# Patient Record
Sex: Male | Born: 1985
Health system: Southern US, Community
[De-identification: ages and names within clinical notes are randomized; demographics above are authoritative.]

## PROBLEM LIST (undated history)

## (undated) DIAGNOSIS — Z789 Other specified health status: Secondary | ICD-10-CM

## (undated) HISTORY — PX: ROTATOR CUFF REPAIR: SHX139

## (undated) HISTORY — PX: OTHER SURGICAL HISTORY: SHX169

---

## 2004-07-15 ENCOUNTER — Ambulatory Visit (HOSPITAL_COMMUNITY): Admission: RE | Admit: 2004-07-15 | Discharge: 2004-07-15 | Payer: Self-pay | Admitting: Family Medicine

## 2013-01-04 ENCOUNTER — Encounter (HOSPITAL_COMMUNITY)
Admission: RE | Admit: 2013-01-04 | Discharge: 2013-01-04 | Disposition: A | Discharge status: Home / Self Care | Payer: BC Managed Care – PPO | Source: Ambulatory Visit | Attending: General Surgery | Admitting: General Surgery

## 2013-01-04 ENCOUNTER — Encounter (HOSPITAL_COMMUNITY): Payer: Self-pay | Admitting: Pharmacy Technician

## 2013-01-04 ENCOUNTER — Encounter (HOSPITAL_COMMUNITY): Payer: Self-pay

## 2013-01-04 HISTORY — DX: Other specified health status: Z78.9

## 2013-01-04 LAB — CBC WITH DIFFERENTIAL/PLATELET
Basophils Relative: 0 % (ref 0–1)
Eosinophils Absolute: 0.1 10*3/uL (ref 0.0–0.7)
HCT: 42 % (ref 39.0–52.0)
Lymphs Abs: 0.7 10*3/uL (ref 0.7–4.0)
Neutro Abs: 7.2 10*3/uL (ref 1.7–7.7)
Neutrophils Relative %: 82 % — ABNORMAL HIGH (ref 43–77)
Platelets: DECREASED 10*3/uL (ref 150–400)
RBC: 4.87 MIL/uL (ref 4.22–5.81)
RDW: 12.4 % (ref 11.5–15.5)

## 2013-01-04 LAB — BASIC METABOLIC PANEL
BUN: 11 mg/dL (ref 6–23)
Calcium: 9.3 mg/dL (ref 8.4–10.5)
Chloride: 104 mEq/L (ref 96–112)
Creatinine, Ser: 1.08 mg/dL (ref 0.50–1.35)
GFR calc Af Amer: >90 mL/min (ref >90)
Sodium: 139 mEq/L (ref 135–145)

## 2013-01-04 NOTE — H&P (Signed)
  NTS SOAP Note  Vital Signs:  Vitals as of: 01/04/2013: Systolic 163: Diastolic 81: Heart Rate 75: Temp 96.23F: Height 53ft 0in: Weight 273Lbs 0 Ounces: Pain Level 10: BMI 37.03  BMI : 37.03 kg/m2  Subjective: This 27 Years 94 Months old Male presents for of a perianal abscess.  Started swelling two days ago.  Was started on cipro and hydrocodone, but started having tongue swelling, so he stopped both of them.  No drainage noted.  Never has had this before.  Review of Symptoms:  Constitutional:unremarkable   Head:unremarkable    Eyes:unremarkable   Nose/Mouth/Throat:unremarkable Cardiovascular:  unremarkable   Respiratory:unremarkable   Gastrointestinal:  unremarkable   Genitourinary:unremarkable       joint, back, and neck pain Skin:unremarkable Hematolgic/Lymphatic:unremarkable     Allergic/Immunologic:unremarkable     Past Medical History:    Reviewed   Past Medical History  Surgical History: none Medical Problems: none Allergies: ?hydrocodone or ciprofloxacin Medications: none   Social History:Reviewed  Social History  Preferred Language: English Race:  White Ethnicity: Not Hispanic / Latino Age: 27 Years 5 Months Marital Status:  M Alcohol:  No Recreational drug(s):  No   Smoking Status: Never smoker reviewed on 01/04/2013 Functional Status reviewed on mm/dd/yyyy ------------------------------------------------ Bathing: Normal Cooking: Normal Dressing: Normal Driving: Normal Eating: Normal Managing Meds: Normal Oral Care: Normal Shopping: Normal Toileting: Normal Transferring: Normal Walking: Normal Cognitive Status reviewed on mm/dd/yyyy ------------------------------------------------ Attention: Normal Decision Making: Normal Language: Normal Memory: Normal Motor: Normal Perception: Normal Problem Solving: Normal Visual and Spatial: Normal   Family History:  Reviewed  Family Health  History Mother, Living; Healthy; healthy Father, Living; Prostate cancer;     Objective Information: General:  Well appearing, well nourished in no distress. Heart:  RRR, no murmur Lungs:    CTA bilaterally, no wheezes, rhonchi, rales.  Breathing unlabored.   Large fluctulant area along left perianal region.  Rectal exam limited secondary to pain.  Assessment:Perianal abscess  Diagnosis &amp; Procedure Smart Code   Plan:Scheduled for incision and drainage of perianal abscess on 01/05/13.  will try flagyl and ultram.   Patient Education:Alternative treatments to surgery were discussed with patient (and family).  Risks and benefits  of procedure were fully explained to the patient (and family) who gave informed consent. Patient/family questions were addressed.  Follow-up:Pending Surgery

## 2013-01-04 NOTE — Patient Instructions (Addendum)
Your procedure is scheduled on: 01/05/2013  Report to [facility] at   10:00  AM.  Call this number if you have problems the morning of surgery: [phone]   Remember:   Do not drink or eat food:After Midnight.  With a small sip of water you make your Tramadol if needed morning of surgery.  :   Do not wear jewelry, make-up or nail polish.  Do not wear lotions, powders, or perfumes. You may wear deodorant.  Do not shave 48 hours prior to surgery. Men may shave face and neck.  Do not bring valuables to the hospital.  Contacts, dentures or bridgework may not be worn into surgery.  Leave suitcase in the car. After surgery it may be brought to your room.  For patients admitted to the hospital, checkout time is 11:00 AM the day of discharge.   Patients discharged the day of surgery will not be allowed to drive home.    Special Instructions: Shower using CHG 2 nights before surgery and the night before surgery.  If you shower the day of surgery use CHG.  Use special wash - you have one bottle of CHG for all showers.  You should use approximately 1/3 of the bottle for each shower.   Please read over the following fact sheets that you were given: Pain Booklet, MRSA Information, Surgical Site Infection Prevention and Care and Recovery After Surgery  Incision and Drainage Care After Refer to this sheet in the next few weeks. These instructions provide you with information on caring for yourself after your procedure. Your caregiver may also give you more specific instructions. Your treatment has been planned according to current medical practices, but problems sometimes occur. Call your caregiver if you have any problems or questions after your procedure. HOME CARE INSTRUCTIONS   If antibiotic medicine is given, take it as directed. Finish it even if you start to feel better.  Only take over-the-counter or prescription medicines for pain, discomfort, or fever as directed by your caregiver.  Keep  all follow-up appointments as directed by your caregiver.  Change any bandages (dressings) as directed by your caregiver. Replace old dressings with clean dressings.  Wash your hands before and after caring for your wound. You will receive specific instructions for cleansing and caring for your wound.  SEEK MEDICAL CARE IF:   You have increased pain, swelling, or redness around the wound.  You have increased drainage, smell, or bleeding from the wound.  You have muscle aches, chills, or you feel generally sick.  You have a fever. MAKE SURE YOU:   Understand these instructions.  Will watch your condition.  Will get help right away if you are not doing well or get worse. Document Released: 04/26/2011 Document Reviewed: 04/26/2011 [facility] Patient Information 2014 [place], [place].   PATIENT INSTRUCTIONS POST-ANESTHESIA  IMMEDIATELY FOLLOWING SURGERY:  Do not drive or operate machinery for the first twenty four hours after surgery.  Do not make any important decisions for twenty four hours after surgery or while taking narcotic pain medications or sedatives.  If you develop intractable nausea and vomiting or a severe headache please notify your doctor immediately.  FOLLOW-UP:  Please make an appointment with your surgeon as instructed. You do not need to follow up with anesthesia unless specifically instructed to do so.  WOUND CARE INSTRUCTIONS (if applicable):  Keep a dry clean dressing on the anesthesia/puncture wound site if there is drainage.  Once the wound has quit draining you may  leave it open to air.  Generally you should leave the bandage intact for twenty four hours unless there is drainage.  If the epidural site drains for more than 36-48 hours please call the anesthesia department.  QUESTIONS?:  Please feel free to call your physician or the hospital operator if you have any questions, and they will be happy to assist you.

## 2013-01-05 ENCOUNTER — Encounter (HOSPITAL_COMMUNITY): Payer: BC Managed Care – PPO | Admitting: Anesthesiology

## 2013-01-05 ENCOUNTER — Encounter (HOSPITAL_COMMUNITY)
Admission: RE | Disposition: A | Discharge status: Home / Self Care | Payer: Self-pay | Source: Ambulatory Visit | Attending: General Surgery

## 2013-01-05 ENCOUNTER — Encounter (HOSPITAL_COMMUNITY): Payer: Self-pay | Admitting: *Deleted

## 2013-01-05 ENCOUNTER — Ambulatory Visit (HOSPITAL_COMMUNITY): Payer: BC Managed Care – PPO | Admitting: Anesthesiology

## 2013-01-05 ENCOUNTER — Ambulatory Visit (HOSPITAL_COMMUNITY)
Admission: RE | Admit: 2013-01-05 | Discharge: 2013-01-05 | Disposition: A | Discharge status: Home / Self Care | Payer: BC Managed Care – PPO | Source: Ambulatory Visit | Attending: General Surgery | Admitting: General Surgery

## 2013-01-05 DIAGNOSIS — K612 Anorectal abscess: Secondary | ICD-10-CM | POA: Insufficient documentation

## 2013-01-05 DIAGNOSIS — K645 Perianal venous thrombosis: Secondary | ICD-10-CM | POA: Insufficient documentation

## 2013-01-05 HISTORY — PX: INCISION AND DRAINAGE ABSCESS: SHX5864

## 2013-01-05 SURGERY — INCISION AND DRAINAGE, ABSCESS
Anesthesia: General | Site: Rectum | Wound class: Dirty or Infected

## 2013-01-05 MED ORDER — BUPIVACAINE HCL (PF) 0.5 % IJ SOLN
INTRAMUSCULAR | Status: AC
  Filled 2013-01-05: qty 30

## 2013-01-05 MED ORDER — LIDOCAINE HCL (PF) 1 % IJ SOLN
INTRAMUSCULAR | Status: AC
  Filled 2013-01-05: qty 5

## 2013-01-05 MED ORDER — GLYCOPYRROLATE 0.2 MG/ML IJ SOLN
INTRAMUSCULAR | Status: AC
  Filled 2013-01-05: qty 2

## 2013-01-05 MED ORDER — SODIUM CHLORIDE 0.9 % IR SOLN
Status: DC | PRN
  Administered 2013-01-05: 1000 mL

## 2013-01-05 MED ORDER — DEXTROSE 5 % IV SOLN
2.0000 g | INTRAVENOUS | Status: AC
  Administered 2013-01-05: 2 g via INTRAVENOUS

## 2013-01-05 MED ORDER — KETOROLAC TROMETHAMINE 30 MG/ML IJ SOLN
30.0000 mg | Freq: Once | INTRAMUSCULAR | Status: AC
  Administered 2013-01-05: 30 mg via INTRAVENOUS
  Filled 2013-01-05: qty 1

## 2013-01-05 MED ORDER — MIDAZOLAM HCL 2 MG/2ML IJ SOLN
INTRAMUSCULAR | Status: AC
  Filled 2013-01-05: qty 2

## 2013-01-05 MED ORDER — CHLORHEXIDINE GLUCONATE 4 % EX LIQD: 1.0000 "application " | Freq: Once | CUTANEOUS | Status: DC

## 2013-01-05 MED ORDER — NEOSTIGMINE METHYLSULFATE 1 MG/ML IJ SOLN
INTRAMUSCULAR | Status: AC
  Filled 2013-01-05: qty 1

## 2013-01-05 MED ORDER — FENTANYL CITRATE 0.05 MG/ML IJ SOLN
25.0000 ug | INTRAMUSCULAR | Status: DC | PRN
  Administered 2013-01-05: 50 ug via INTRAVENOUS

## 2013-01-05 MED ORDER — FENTANYL CITRATE 0.05 MG/ML IJ SOLN
INTRAMUSCULAR | Status: AC
  Filled 2013-01-05: qty 2

## 2013-01-05 MED ORDER — DEXTROSE 5 % IV SOLN
INTRAVENOUS | Status: AC
  Filled 2013-01-05: qty 2

## 2013-01-05 MED ORDER — ONDANSETRON HCL 4 MG/2ML IJ SOLN
INTRAMUSCULAR | Status: AC
  Filled 2013-01-05: qty 2

## 2013-01-05 MED ORDER — BUPIVACAINE HCL (PF) 0.5 % IJ SOLN
INTRAMUSCULAR | Status: DC | PRN
  Administered 2013-01-05: 10 mL

## 2013-01-05 MED ORDER — ONDANSETRON HCL 4 MG/2ML IJ SOLN: 4.0000 mg | Freq: Once | INTRAMUSCULAR | Status: DC | PRN

## 2013-01-05 MED ORDER — PROPOFOL 10 MG/ML IV EMUL
INTRAVENOUS | Status: AC
  Filled 2013-01-05: qty 20

## 2013-01-05 MED ORDER — FENTANYL CITRATE 0.05 MG/ML IJ SOLN
INTRAMUSCULAR | Status: DC | PRN
  Administered 2013-01-05 (×2): 50 ug via INTRAVENOUS

## 2013-01-05 MED ORDER — MIDAZOLAM HCL 5 MG/5ML IJ SOLN
INTRAMUSCULAR | Status: DC | PRN
  Administered 2013-01-05: 2 mg via INTRAVENOUS

## 2013-01-05 MED ORDER — FENTANYL CITRATE 0.05 MG/ML IJ SOLN
25.0000 ug | INTRAMUSCULAR | Status: AC
  Administered 2013-01-05 (×2): 25 ug via INTRAVENOUS

## 2013-01-05 MED ORDER — LACTATED RINGERS IV SOLN
INTRAVENOUS | Status: DC
  Administered 2013-01-05: 11:00:00 via INTRAVENOUS

## 2013-01-05 MED ORDER — PROPOFOL 10 MG/ML IV BOLUS
INTRAVENOUS | Status: DC | PRN
  Administered 2013-01-05: 180 mg via INTRAVENOUS

## 2013-01-05 MED ORDER — MIDAZOLAM HCL 2 MG/2ML IJ SOLN
1.0000 mg | INTRAMUSCULAR | Status: DC | PRN
  Administered 2013-01-05 (×2): 2 mg via INTRAVENOUS

## 2013-01-05 MED ORDER — ONDANSETRON HCL 4 MG/2ML IJ SOLN
4.0000 mg | Freq: Once | INTRAMUSCULAR | Status: AC
  Administered 2013-01-05: 4 mg via INTRAVENOUS

## 2013-01-05 MED ORDER — LIDOCAINE HCL 1 % IJ SOLN
INTRAMUSCULAR | Status: DC | PRN
  Administered 2013-01-05: 50 mg via INTRADERMAL

## 2013-01-05 SURGICAL SUPPLY — 30 items
BAG HAMPER (MISCELLANEOUS) ×2 IMPLANT
CLOTH BEACON ORANGE TIMEOUT ST (SAFETY) ×2 IMPLANT
COVER LIGHT HANDLE STERIS (MISCELLANEOUS) ×4 IMPLANT
DECANTER SPIKE VIAL GLASS SM (MISCELLANEOUS) ×2 IMPLANT
ELECT REM PT RETURN 9FT ADLT (ELECTROSURGICAL) ×2
ELECTRODE REM PT RTRN 9FT ADLT (ELECTROSURGICAL) ×1 IMPLANT
GAUZE PACKING IODOFORM 1 (PACKING) ×1 IMPLANT
GLOVE BIOGEL M STRL SZ7.5 (GLOVE) ×2 IMPLANT
GLOVE BIOGEL PI IND STRL 7.0 (GLOVE) IMPLANT
GLOVE BIOGEL PI IND STRL 7.5 (GLOVE) IMPLANT
GLOVE BIOGEL PI INDICATOR 7.0 (GLOVE) ×2
GLOVE BIOGEL PI INDICATOR 7.5 (GLOVE) ×1
GLOVE ECLIPSE 7.0 STRL STRAW (GLOVE) ×2 IMPLANT
GLOVE EXAM NITRILE LRG STRL (GLOVE) ×1 IMPLANT
GLOVE SS BIOGEL STRL SZ 6.5 (GLOVE) IMPLANT
GLOVE SUPERSENSE BIOGEL SZ 6.5 (GLOVE) ×1
GOWN STRL REIN XL XLG (GOWN DISPOSABLE) ×5 IMPLANT
KIT ROOM TURNOVER APOR (KITS) ×2 IMPLANT
MANIFOLD NEPTUNE II (INSTRUMENTS) ×2 IMPLANT
MARKER SKIN DUAL TIP RULER LAB (MISCELLANEOUS) ×2 IMPLANT
NDL HYPO 21X1.5 SAFETY (NEEDLE) IMPLANT
NEEDLE HYPO 21X1.5 SAFETY (NEEDLE) ×2 IMPLANT
NS IRRIG 1000ML POUR BTL (IV SOLUTION) ×2 IMPLANT
PACK MINOR (CUSTOM PROCEDURE TRAY) ×2 IMPLANT
PAD ARMBOARD 7.5X6 YLW CONV (MISCELLANEOUS) ×2 IMPLANT
SET BASIN LINEN APH (SET/KITS/TRAYS/PACK) ×2 IMPLANT
SWAB CULTURE LIQ STUART DBL (MISCELLANEOUS) ×1 IMPLANT
SYR BULB IRRIGATION 50ML (SYRINGE) ×2 IMPLANT
SYR CONTROL 10ML LL (SYRINGE) ×1 IMPLANT
TUBE ANAEROBIC PORT A CUL  W/M (MISCELLANEOUS) ×1 IMPLANT

## 2013-01-05 NOTE — Anesthesia Procedure Notes (Signed)
Procedure Name: Intubation Date/Time: 01/05/2013 12:23 PM Performed by: Despina Hidden Pre-anesthesia Checklist: Emergency Drugs available, Patient identified, Suction available and Patient being monitored Patient Re-evaluated:Patient Re-evaluated prior to inductionOxygen Delivery Method: Circle system utilized Preoxygenation: Pre-oxygenation with 100% oxygen Intubation Type: IV induction Ventilation: Mask ventilation without difficulty LMA: LMA inserted LMA Size: 5.0 Tube type: Oral Number of attempts: 1 Placement Confirmation: positive ETCO2 and breath sounds checked- equal and bilateral Tube secured with: Tape Dental Injury: Teeth and Oropharynx as per pre-operative assessment

## 2013-01-05 NOTE — Anesthesia Postprocedure Evaluation (Signed)
  Anesthesia Post-op Note  Patient: [name] Kelley  Procedure(s) Performed: Procedure(s): INCISION AND DRAINAGE PERIANAL  ABSCESS (N/A)  Patient Location: PACU  Anesthesia Type:General  Level of Consciousness: awake, alert , oriented and patient cooperative  Airway and Oxygen Therapy: Patient Spontanous Breathing  Post-op Pain: 2 /10, mild  Post-op Assessment: Post-op Vital signs reviewed, Patient's Cardiovascular Status Stable, Respiratory Function Stable, Patent Airway, No signs of Nausea or vomiting and Pain level controlled  Post-op Vital Signs: Reviewed and stable  Complications: No apparent anesthesia complications

## 2013-01-05 NOTE — Transfer of Care (Signed)
Immediate Anesthesia Transfer of Care Note  Patient: [name] Kelley  Procedure(s) Performed: Procedure(s): INCISION AND DRAINAGE PERIANAL  ABSCESS (N/A)  Patient Location: PACU  Anesthesia Type:General  Level of Consciousness: awake and patient cooperative  Airway & Oxygen Therapy: Patient Spontanous Breathing and Patient connected to face mask oxygen  Post-op Assessment: Report given to PACU RN, Post -op Vital signs reviewed and stable and Patient moving all extremities  Post vital signs: Reviewed and stable  Complications: No apparent anesthesia complications

## 2013-01-05 NOTE — Interval H&P Note (Signed)
History and Physical Interval Note:  01/05/2013 11:43 AM  [name] Kelley  has presented today for surgery, with the diagnosis of perianal abscess  The various methods of treatment have been discussed with the patient and family. After consideration of risks, benefits and other options for treatment, the patient has consented to  Procedure(s): INCISION AND DRAINAGE PERIANAL  ABSCESS (N/A) as a surgical intervention .  The patient's history has been reviewed, patient examined, no change in status, stable for surgery.  I have reviewed the patient's chart and labs.  Questions were answered to the patient's satisfaction.     Franky Macho A

## 2013-01-05 NOTE — Anesthesia Preprocedure Evaluation (Addendum)
Anesthesia Evaluation  Patient identified by MRN, date of birth, ID band Patient awake    Reviewed: Allergy & Precautions, H&P , NPO status , Patient's Chart, lab work & pertinent test results  Airway Mallampati: I  TM Distance: >3 FB Neck ROM: Full    Dental  (+) Teeth Intact   Pulmonary neg pulmonary ROS,  breath sounds clear to auscultation        Cardiovascular negative cardio ROS  Rhythm:Regular Rate:Normal     Neuro/Psych    GI/Hepatic negative GI ROS,   Endo/Other    Renal/GU      Musculoskeletal   Abdominal   Peds  Hematology   Anesthesia Other Findings   Reproductive/Obstetrics                             Anesthesia Physical Anesthesia Plan  ASA: I  Anesthesia Plan: General   Post-op Pain Management:    Induction: Intravenous  Airway Management Planned: LMA  Additional Equipment:   Intra-op Plan:   Post-operative Plan: Extubation in OR  Informed Consent: I have reviewed the patients History and Physical, chart, labs and discussed the procedure including the risks, benefits and alternatives for the proposed anesthesia with the patient or authorized representative who has indicated his/her understanding and acceptance.     Plan Discussed with:   Anesthesia Plan Comments:         Anesthesia Quick Evaluation  

## 2013-01-05 NOTE — Op Note (Signed)
Patient:  [name] Kelley  DOB:  January 24, 1986  MRN:  [id number]   Preop Diagnosis:  Perianal abscess  Postop Diagnosis:  Same, thrombosed external hemorrhoid  Procedure:  Incision and drainage of perianal abscess, excision of clot, thrombosed external hemorrhoid  Surgeon:  Franky Macho, M.D.  Anes:  General  Indications:  Patient is a 27 year old white male who presents with a several day history of worsening swelling in the perianal region. He was noted to have a large fluctuant area at approximately the 3 to 4:00 position of the anus. He also has some thrombosed hemorrhoidal tissue present. The risks and benefits of the procedure including bleeding, infection, and recurrence of the abscess and hemorrhoidal disease were fully explained to the patient, who gave informed consent.  Procedure note:  The patient was placed in the lithotomy position after general anesthesia was administered. The perineum was prepped and draped using usual sterile technique with Betadine. Surgical site confirmation was performed.  On examination, the patient had a large superficial fluctuant area at the 3:00 position of the anus. An incision was made and aerobic and anaerobic cultures were taken and sent to microbiology. The abscess cavity was under significant pressure. It was irrigated with normal saline. Any superficial bleeding was controlled using Bovie electrocautery. I could not find a fistulous track into the rectum itself. The abscess cavity did not appear to track submuscular. In addition, a large thrombosed external hemorrhoid was noted at the 10:00 position. The thrombus was enucleated from the hemorrhoidal tissue. Other smaller hemorrhoids were noted in the rectum, but these were not addressed. A bleeding was controlled using Bovie electrocautery. No significant bleeding was noted the end of the procedure. 0.5% Sensorcaine was instilled the surrounding peritoneum. The abscess cavity was packed with iodoform new  gauze.  All tape and needle counts were correct the end of the procedure. Patient was awakened and transferred to PACU in stable condition.  Complications:  None  EBL:  Minimal  Specimen:  Aerobic and anaerobic cultures of perianal abscess

## 2013-01-05 NOTE — Interval H&P Note (Signed)
History and Physical Interval Note:  01/05/2013 11:43 AM  [name] Kelley  has presented today for surgery, with the diagnosis of perianal abscess  The various methods of treatment have been discussed with the patient and family. After consideration of risks, benefits and other options for treatment, the patient has consented to  Procedure(s): INCISION AND DRAINAGE PERIANAL  ABSCESS (N/A) as a surgical intervention .  The patient's history has been reviewed, patient examined, no change in status, stable for surgery.  I have reviewed the patient's chart and labs.  Questions were answered to the patient's satisfaction.     Milus Fritze A   

## 2013-01-08 LAB — CULTURE, ROUTINE-ABSCESS

## 2013-01-08 NOTE — Transfer of Care (Signed)
Immediate Anesthesia Transfer of Care Note  Patient: [name] Kelley  Procedure(s) Performed: Procedure(s): INCISION AND DRAINAGE PERIANAL  ABSCESS (N/A)  Patient Location: PACU  Anesthesia Type:General  Level of Consciousness: awake, alert , oriented and patient cooperative  Airway & Oxygen Therapy: Patient Spontanous Breathing  Post-op Assessment: Report given to PACU RN, Post -op Vital signs reviewed and stable and Patient moving all extremities X 4  Post vital signs: Reviewed and stable  Complications: No apparent anesthesia complications

## 2013-01-10 ENCOUNTER — Encounter (HOSPITAL_COMMUNITY): Payer: Self-pay | Admitting: General Surgery

## 2013-01-12 LAB — ANAEROBIC CULTURE

## 2015-02-16 HISTORY — PX: OTHER SURGICAL HISTORY: SHX169

## 2015-07-03 ENCOUNTER — Ambulatory Visit (INDEPENDENT_AMBULATORY_CARE_PROVIDER_SITE_OTHER): Payer: 59 | Admitting: Orthopaedic Surgery

## 2015-07-03 ENCOUNTER — Ambulatory Visit (INDEPENDENT_AMBULATORY_CARE_PROVIDER_SITE_OTHER): Payer: 59

## 2015-07-03 ENCOUNTER — Encounter: Payer: Self-pay | Admitting: Orthopaedic Surgery

## 2015-07-03 VITALS — BP 141/74 | HR 74 | Temp 98.1°F | Resp 16 | Ht 73.0 in | Wt 252.0 lb

## 2015-07-03 DIAGNOSIS — M25511 Pain in right shoulder: Secondary | ICD-10-CM

## 2015-07-03 DIAGNOSIS — M24072 Loose body in left ankle: Secondary | ICD-10-CM | POA: Diagnosis not present

## 2015-07-03 DIAGNOSIS — M25572 Pain in left ankle and joints of left foot: Secondary | ICD-10-CM

## 2015-07-03 NOTE — Patient Instructions (Signed)
WE WILL SCHEDULE MRI FOR YOU AND CALL YOU WITH [facility] 

## 2015-07-03 NOTE — Progress Notes (Signed)
Subjective: I have left ankle pain and right shoulder pain    Patient ID: [name] Kelley, male    DOB: June 08, 1985, 30 y.o.   MRN: [id number]  Ankle Pain  The incident occurred more than 1 week ago. The incident occurred at home. The injury mechanism was an eversion injury and a fall. The pain is present in the left ankle. The quality of the pain is described as aching. The pain is moderate. The pain has been fluctuating since onset. Associated symptoms include a loss of motion. Pertinent negatives include no inability to bear weight, loss of sensation, muscle weakness, numbness or tingling. He has tried elevation, ice, immobilization, NSAIDs and rest for the symptoms. The treatment provided moderate relief.  Shoulder Pain  The pain is present in the right shoulder. This is a chronic problem. The current episode started more than 1 year ago. There has been a history of trauma. The problem occurs daily. The problem has been waxing and waning. The quality of the pain is described as aching. The pain is at a severity of 2/10. The pain is mild. Pertinent negatives include no inability to bear weight, numbness or tingling. The symptoms are aggravated by activity and cold. The treatment provided moderate relief.   He hurt his ankle in September of 2016.  He had x-rays done 11-01-14 which were negative. He had significant swelling and bruising of the ankle and foot. He took pictures then and has shown me the pictures on his phone.  He had a CAM walker.  He wore that several weeks and got slowly better.  Since then he gets swelling of the ankle laterally but more pain anterior and medially.  He has some discoloration still.  He has no numbness.  Dr. Sherwood GamblerFusco saw him recently and started him on Mobic which has helped some.  He has no new trauma.  His right shoulder was hurt several years ago.  Certain positions he puts his arm in caused pain. He has no numbness, no problem sleeping.  His son is 32 years old and  throwing a ball to his son causes pain.  He wants this checked out as well.   Review of Systems  HENT: Negative for congestion.   Respiratory: Negative for cough and shortness of breath.   Cardiovascular: Negative for chest pain and leg swelling.  Endocrine: Negative for cold intolerance.  Musculoskeletal: Positive for myalgias, joint swelling, arthralgias and gait problem.  Allergic/Immunologic: Negative for environmental allergies.  Neurological: Negative for tingling and numbness.   Past Medical History  Diagnosis Date  . Medical history non-contributory     Past Surgical History  Procedure Laterality Date  . Arm manipulation Right 1995?  Marland Kitchen. Incision and drainage abscess N/A 01/05/2013    Procedure: INCISION AND DRAINAGE PERIANAL  ABSCESS;  Surgeon: Dalia HeadingMark A Jenkins, MD;  Location: [facility];  Service: General;  Laterality: N/A;    No current outpatient prescriptions on file prior to visit.   No current facility-administered medications on file prior to visit.    Social History   Social History  . Marital Status: Married    Spouse Name: N/A  . Number of Children: N/A  . Years of Education: N/A   Occupational History  . Not on file.   Social History Main Topics  . Smoking status: Never Smoker   . Smokeless tobacco: Not on file  . Alcohol Use: No  . Drug Use: No  . Sexual Activity: Not on file  Other Topics Concern  . Not on file   Social History Narrative    BP 141/74 mmHg  Pulse 74  Temp(Src) 98.1 F (36.7 C)  Resp 16  Ht  (1.854 m)  Wt 252 lb (114.306 kg)  BMI 33.25 kg/m2     Objective:   Physical Exam  Constitutional: He is oriented to person, place, and time. He appears well-developed and well-nourished.  HENT:  Head: Normocephalic and atraumatic.  Eyes: Conjunctivae and EOM are normal. Pupils are equal, round, and reactive to light.  Neck: Normal range of motion. Neck supple.  Cardiovascular: Normal rate, regular rhythm and intact distal  pulses.   Pulmonary/Chest: Effort normal.  Abdominal: Soft.  Musculoskeletal: He exhibits tenderness (left ankle has some tenderness more medially, slight swelling laterally, ROM is full but tender, NV intact.).  Neurological: He is alert and oriented to person, place, and time. He has normal reflexes. No cranial nerve deficit. He exhibits normal muscle tone. Coordination normal.  Skin: Skin is warm and dry.  Psychiatric: He has a normal mood and affect. His behavior is normal. Judgment and thought content normal.  Examination of right Upper Extremity is done.  Inspection:   Overall:  Elbow non-tender without crepitus or defects, forearm non-tender without crepitus or defects, wrist non-tender without crepitus or defects, hand non-tender.    Shoulder: with glenohumeral joint tenderness, without effusion.   Upper arm: without swelling and tenderness   Range of motion:   Overall:  Full range of motion of the elbow, full range of motion of wrist and full range of motion in fingers.   Shoulder:  right  full degrees forward flexion; full degrees abduction; 30 degrees internal rotation, 25 degrees external rotation, 15 degrees extension, 40 degrees adduction.   Stability:   Overall:  Shoulder, elbow and wrist stable   Strength and Tone:   Overall full shoulder muscles strength, full upper arm strength and normal upper arm bulk and tone.  He has more pain with external rotation.   X-rays of the right shoulder and left ankle were done, reported separately.    Assessment & Plan:   Encounter Diagnoses  Name Primary?  . Right shoulder pain Yes  . Left ankle pain    I am concerned about a loose body in his ankle joint.  His x-rays are abnormal and his symptoms are consistent with this, more medially.  He has not improved in eight months and is even worse with the left ankle.  I would like to get a MRI of the ankle.  He is to continue the Mobic.  Return after MRI.  Call if any problem.

## 2015-07-09 ENCOUNTER — Other Ambulatory Visit: Payer: Self-pay | Admitting: Orthopedic Surgery

## 2015-07-09 ENCOUNTER — Ambulatory Visit (HOSPITAL_COMMUNITY)
Admission: RE | Admit: 2015-07-09 | Discharge: 2015-07-09 | Disposition: A | Discharge status: Home / Self Care | Payer: 59 | Source: Ambulatory Visit | Attending: Orthopaedic Surgery | Admitting: Orthopaedic Surgery

## 2015-07-09 ENCOUNTER — Other Ambulatory Visit: Payer: Self-pay | Admitting: Orthopaedic Surgery

## 2015-07-09 DIAGNOSIS — R609 Edema, unspecified: Secondary | ICD-10-CM | POA: Insufficient documentation

## 2015-07-09 DIAGNOSIS — R937 Abnormal findings on diagnostic imaging of other parts of musculoskeletal system: Secondary | ICD-10-CM | POA: Diagnosis not present

## 2015-07-09 DIAGNOSIS — M659 Synovitis and tenosynovitis, unspecified: Secondary | ICD-10-CM | POA: Diagnosis not present

## 2015-07-09 DIAGNOSIS — M24072 Loose body in left ankle: Secondary | ICD-10-CM | POA: Diagnosis present

## 2015-07-10 ENCOUNTER — Ambulatory Visit (INDEPENDENT_AMBULATORY_CARE_PROVIDER_SITE_OTHER): Payer: 59 | Admitting: Orthopaedic Surgery

## 2015-07-10 ENCOUNTER — Encounter: Payer: Self-pay | Admitting: Orthopaedic Surgery

## 2015-07-10 VITALS — BP 125/77 | HR 71 | Temp 97.9°F | Ht 72.0 in | Wt 244.0 lb

## 2015-07-10 DIAGNOSIS — M25572 Pain in left ankle and joints of left foot: Secondary | ICD-10-CM

## 2015-07-10 DIAGNOSIS — M24072 Loose body in left ankle: Secondary | ICD-10-CM

## 2015-07-10 MED ORDER — PREDNISONE 10 MG (21) PO TBPK: ORAL_TABLET | ORAL | Status: DC

## 2015-07-10 NOTE — Progress Notes (Signed)
Patient WU:JWJXBJ [name] Kelley, male DOB:Jun 17, 1985, 30 y.o. YNW:[id number]  Chief Complaint  Patient presents with  . Results    MRI left ankle    HPI  [name] Kelley is a 30 y.o. male who has ankle pain on the left.  He had the MRI of the ankle done yesterday.  The findings:  IMPRESSION: 1. Thickened and indistinct appearance of the anterior talofibular ligament, with a small ossicle in the vicinity of the ATFL, suggesting a prior tear and partial healing. The possibility of an underlying original avulsion is not excluded. 2. Mildly accentuated increased T2 signal in the deep tibiotalar portion of the spring ligament with several ossicles in this vicinity, 18 potentially from a chronic injury with avulsion. 3. The anterior inferior tibiofibular ligament appears abnormally thickened and there is some edema signal along the distal margin of the syndesmosis on image 17/5 suggesting prior syndesmotic injury. 4. The inferoplantar longitudinal component of the spring ligament is thickened and edematous, compatible with chronic micro injury or prior tear. 5. Mild distal tibialis posterior and mild tibialis anterior tenosynovitis. 6. There is abnormal marrow edema along the calcaneal side of the posterior subtalar facet medially compatible with early arthropathy.  I went over each point with him and used a drawing of the ankle to explain the findings.  He only wore the CAM walker about a week after his injury in September.  I will give prednisone dose pack for him to use now and see if that will help.  Light duty restrictions given.  Precautions given. HPI  Body mass index is 33.09 kg/(m^2).  ROS  Review of Systems  HENT: Negative for congestion.   Respiratory: Negative for cough and shortness of breath.   Cardiovascular: Negative for chest pain and leg swelling.  Endocrine: Negative for cold intolerance.  Musculoskeletal: Positive for myalgias, joint swelling, arthralgias and gait  problem.  Allergic/Immunologic: Negative for environmental allergies.  Neurological: Negative for numbness.    Past Medical History  Diagnosis Date  . Medical history non-contributory     Past Surgical History  Procedure Laterality Date  . Arm manipulation Right 1995?  Marland Kitchen Incision and drainage abscess N/A 01/05/2013    Procedure: INCISION AND DRAINAGE PERIANAL  ABSCESS;  Surgeon: Dalia Heading, MD;  Location: [facility];  Service: General;  Laterality: N/A;    History reviewed. No pertinent family history.  Social History Social History  Substance Use Topics  . Smoking status: Never Smoker   . Smokeless tobacco: None  . Alcohol Use: No    No Known Allergies  Current Outpatient Prescriptions  Medication Sig Dispense Refill  . meloxicam (MOBIC) 7.5 MG tablet Take 7.5 mg by mouth daily.    . predniSONE (STERAPRED UNI-PAK 21 TAB) 10 MG (21) TBPK tablet Take six pills the first day;5 pills the next day;4 pills the next day;3 pills the next day; 2 pills the next day,one the final day. 21 tablet 1   No current facility-administered medications for this visit.     Physical Exam  Blood pressure 125/77, pulse 71, temperature 97.9 F (36.6 C), height 6' (1.829 m), weight 244 lb (110.678 kg).  Constitutional: overall normal hygiene, normal nutrition, well developed, normal grooming, normal body habitus. Assistive device:none  Musculoskeletal: gait and station Limp left, muscle tone and strength are normal, no tremors or atrophy is present.  .  Neurological: coordination overall normal.  Deep tendon reflex/nerve stretch intact.  Sensation normal.  Cranial nerves II-XII intact.   Skin:  normal overall no scars, lesions, ulcers or rashes. No psoriasis.  Psychiatric: Alert and oriented x 3.  Recent memory intact, remote memory unclear.  Normal mood and affect. Well groomed.  Good eye contact.  Cardiovascular: overall no swelling, no varicosities, no edema bilaterally, normal  temperatures of the legs and arms, no clubbing, cyanosis and good capillary refill.  Lymphatic: palpation is normal.  Right Ankle Exam  Right ankle exam is normal.  Range of Motion  The patient has normal right ankle ROM.  Muscle Strength  The patient has normal right ankle strength.  Tests  Anterior drawer: negative Other  Erythema: absent Scars: absent Sensation: normal Pulse: present    Left Ankle Exam  Swelling: mild  Tenderness  The patient is experiencing tenderness in the ATF and deltoid.   Range of Motion  The patient has normal left ankle ROM.   Muscle Strength  The patient has normal left ankle strength.  Tests  Anterior drawer: negative  Other  Erythema: absent Scars: absent Sensation: normal Pulse: present     The patient has been educated about the nature of the problem(s) and counseled on treatment options.  The patient appeared to understand what I have discussed and is in agreement with it.  Encounter Diagnoses  Name Primary?  . Left ankle pain Yes  . Loose body of left ankle     PLAN Call if any problems.  Precautions discussed.  Continue current medications.   Return to clinic 2 weeks

## 2015-07-10 NOTE — Patient Instructions (Signed)
Continue light duty, no climbing ladders, no prolonged walking or standing

## 2015-07-16 ENCOUNTER — Ambulatory Visit (HOSPITAL_COMMUNITY): Payer: 59

## 2015-07-17 ENCOUNTER — Encounter: Payer: Self-pay | Admitting: Orthopaedic Surgery

## 2015-07-17 ENCOUNTER — Ambulatory Visit: Payer: 59 | Admitting: Orthopaedic Surgery

## 2015-07-24 ENCOUNTER — Ambulatory Visit: Payer: 59 | Admitting: Orthopaedic Surgery

## 2015-07-30 ENCOUNTER — Ambulatory Visit (INDEPENDENT_AMBULATORY_CARE_PROVIDER_SITE_OTHER): Payer: 59 | Admitting: Orthopaedic Surgery

## 2015-07-30 ENCOUNTER — Encounter: Payer: Self-pay | Admitting: Orthopaedic Surgery

## 2015-07-30 VITALS — BP 120/74 | HR 67 | Temp 97.9°F | Ht 72.0 in | Wt 248.0 lb

## 2015-07-30 DIAGNOSIS — M25572 Pain in left ankle and joints of left foot: Secondary | ICD-10-CM

## 2015-07-30 NOTE — Patient Instructions (Signed)
Work restrictions: limited duty, limited climbing on ladders, limited walking or standing.

## 2015-07-30 NOTE — Progress Notes (Signed)
Patient VF:IEPPIR:[name], male DOB:07-13-1985, 30 y.o. JJO:[id number]  Chief Complaint  Patient presents with  . Follow-up    left ankle    HPI  [name] Kelley is a 30 y.o. male who has chronic left ankle pain.  Last visit I went over the MRI of the left ankle which showed showing ATFL with prior tear and partial healing.  There were other changes.  I put him on prednisone dose pack to see if that helped at all.  It did help some.  He has less pain in the left lateral ankle.  His swelling comes and goes.  He is active.  I want him to resume the Mobic but take it twice a day.   When this Rx runs out, then call and I will give him Mobic 15 once a day.  He agrees.  I will see him in a month.  If he gets worse, call.  If he gets much better, cancel.  HPI  Body mass index is 33.63 kg/(m^2).  ROS  Review of Systems  HENT: Negative for congestion.   Respiratory: Negative for cough and shortness of breath.   Cardiovascular: Negative for chest pain and leg swelling.  Endocrine: Negative for cold intolerance.  Musculoskeletal: Positive for myalgias, joint swelling, arthralgias and gait problem.  Allergic/Immunologic: Negative for environmental allergies.  Neurological: Negative for numbness.    Past Medical History  Diagnosis Date  . Medical history non-contributory     Past Surgical History  Procedure Laterality Date  . Arm manipulation Right 1995?  Marland Kitchen. Incision and drainage abscess N/A 01/05/2013    Procedure: INCISION AND DRAINAGE PERIANAL  ABSCESS;  Surgeon: Dalia HeadingMark A Jenkins, MD;  Location: [facility];  Service: General;  Laterality: N/A;    History reviewed. No pertinent family history.  Social History Social History  Substance Use Topics  . Smoking status: Never Smoker   . Smokeless tobacco: None  . Alcohol Use: No    No Known Allergies  Current Outpatient Prescriptions  Medication Sig Dispense Refill  . meloxicam (MOBIC) 7.5 MG tablet Take 7.5 mg by mouth daily.    .  predniSONE (STERAPRED UNI-PAK 21 TAB) 10 MG (21) TBPK tablet Take six pills the first day;5 pills the next day;4 pills the next day;3 pills the next day; 2 pills the next day,one the final day. 21 tablet 1   No current facility-administered medications for this visit.     Physical Exam  Blood pressure 120/74, pulse 67, temperature 97.9 F (36.6 C), height 6' (1.829 m), weight 248 lb (112.492 kg).  Constitutional: overall normal hygiene, normal nutrition, well developed, normal grooming, normal body habitus. Assistive device:none  Musculoskeletal: gait and station Limp none, muscle tone and strength are normal, no tremors or atrophy is present.  .  Neurological: coordination overall normal.  Deep tendon reflex/nerve stretch intact.  Sensation normal.  Cranial nerves II-XII intact.   Skin:   normall overall no scars, lesions, ulcers or rashes. No psoriasis.  Psychiatric: Alert and oriented x 3.  Recent memory intact, remote memory unclear.  Normal mood and affect. Well groomed.  Good eye contact.  Cardiovascular: overall no swelling, no varicosities, no edema bilaterally, normal temperatures of the legs and arms, no clubbing, cyanosis and good capillary refill.  Lymphatic: palpation is normal.  He has some just minimal tenderness of the anterior talofibular ligament and no swelling on the left.  Gait is good.  NV intact. ROM full.  Right ankle negative.  The  patient has been educated about the nature of the problem(s) and counseled on treatment options.  The patient appeared to understand what I have discussed and is in agreement with it.  Encounter Diagnosis  Name Primary?  . Left ankle pain Yes    PLAN Call if any problems.  Precautions discussed.  Continue current medications.   Return to clinic 1 month   Electronically Signed Darreld Mclean, MD 6/14/20177:52 PM

## 2015-08-27 ENCOUNTER — Ambulatory Visit: Payer: 59 | Admitting: Orthopaedic Surgery

## 2015-09-02 ENCOUNTER — Encounter: Payer: Self-pay | Admitting: Orthopaedic Surgery

## 2015-09-02 ENCOUNTER — Ambulatory Visit (INDEPENDENT_AMBULATORY_CARE_PROVIDER_SITE_OTHER): Payer: 59 | Admitting: Orthopaedic Surgery

## 2015-09-02 VITALS — BP 113/64 | HR 70 | Temp 99.0°F | Ht 72.0 in | Wt 255.0 lb

## 2015-09-02 DIAGNOSIS — M25511 Pain in right shoulder: Secondary | ICD-10-CM | POA: Diagnosis not present

## 2015-09-02 DIAGNOSIS — G8929 Other chronic pain: Secondary | ICD-10-CM

## 2015-09-02 DIAGNOSIS — M25572 Pain in left ankle and joints of left foot: Secondary | ICD-10-CM | POA: Diagnosis not present

## 2015-09-02 MED ORDER — MELOXICAM 15 MG PO TABS
15.0000 mg | ORAL_TABLET | Freq: Every day | ORAL | Status: DC
Start: 2015-09-02 — End: 2015-12-09

## 2015-09-02 NOTE — Patient Instructions (Signed)
Refer to Dr. Lajoyce Cornersuda at [facility]. left ankle and right shoulder

## 2015-09-02 NOTE — Progress Notes (Signed)
Patient [name], male DOB:1985-06-25, 30 y.o. UVO:[id number]  Chief Complaint  Patient presents with  . Follow-up    Left ankle pain    HPI  [name] CablesDennis J Kelley is a 30 y.o. male who has chronic ankle pain on the left.  He has had MRI showing incomplete healing of the ATF ligament.  He has pain with a lot of walking or running.  He has stopped running.  I have talked to him about considering possible surgery.  He is willing to see someone about possible surgery.  He also has chronic right shoulder pain. He would like to have this further evaluated. HPI  Body mass index is 34.58 kg/(m^2).  ROS  Review of Systems  HENT: Negative for congestion.   Respiratory: Negative for cough and shortness of breath.   Cardiovascular: Negative for chest pain and leg swelling.  Endocrine: Negative for cold intolerance.  Musculoskeletal: Positive for myalgias, joint swelling, arthralgias and gait problem.  Allergic/Immunologic: Negative for environmental allergies.  Neurological: Negative for numbness.    Past Medical History  Diagnosis Date  . Medical history non-contributory     Past Surgical History  Procedure Laterality Date  . Arm manipulation Right 1995?  Marland Kitchen. Incision and drainage abscess N/A 01/05/2013    Procedure: INCISION AND DRAINAGE PERIANAL  ABSCESS;  Surgeon: Dalia HeadingMark A Jenkins, MD;  Location: [facility];  Service: General;  Laterality: N/A;    History reviewed. No pertinent family history.  Social History Social History  Substance Use Topics  . Smoking status: Never Smoker   . Smokeless tobacco: None  . Alcohol Use: No    No Known Allergies  Current Outpatient Prescriptions  Medication Sig Dispense Refill  . meloxicam (MOBIC) 15 MG tablet Take 1 tablet (15 mg total) by mouth daily. 30 tablet 5   No current facility-administered medications for this visit.     Physical Exam  Blood pressure 113/64, pulse 70, temperature 99 F (37.2 C), height 6' (1.829 m), weight 255  lb (115.667 kg).  Constitutional: overall normal hygiene, normal nutrition, well developed, normal grooming, normal body habitus. Assistive device:none  Musculoskeletal: gait and station Limp left, muscle tone and strength are normal, no tremors or atrophy is present.  .  Neurological: coordination overall normal.  Deep tendon reflex/nerve stretch intact.  Sensation normal.  Cranial nerves II-XII intact.   Skin:   normal overall no scars, lesions, ulcers or rashes. No psoriasis.  Psychiatric: Alert and oriented x 3.  Recent memory intact, remote memory unclear.  Normal mood and affect. Well groomed.  Good eye contact.  Cardiovascular: overall no swelling, no varicosities, no edema bilaterally, normal temperatures of the legs and arms, no clubbing, cyanosis and good capillary refill.  Lymphatic: palpation is normal.  His left ankle has swelling laterally and tenderness of the anterior talofibular ligament area.  Gait is a slight limp to the left  He has full motion.  The right shoulder has full motion today but pain to resisted abduction.  NV is intact.  Neck is negative.  Left shoulder is negative.  The patient has been educated about the nature of the problem(s) and counseled on treatment options.  The patient appeared to understand what I have discussed and is in agreement with it.  Encounter Diagnoses  Name Primary?  . Chronic ankle pain, left Yes  . Left ankle pain   . Right shoulder pain     PLAN Call if any problems.  Precautions discussed.  Continue current medications.  Return to clinic to see Dr. Lajoyce Corners for evaluation for possible ankle surgery. Electronically Signed Darreld Mclean, MD 7/18/20179:37 PM

## 2015-09-03 ENCOUNTER — Telehealth: Payer: Self-pay | Admitting: Orthopaedic Surgery

## 2015-09-03 NOTE — Telephone Encounter (Signed)
Patient called in response to voice message about referral appointment to [facility]; please call back - states may leave a detailed message with this information if he is unable to answer while at work but will try to answer. Ph# [phone]

## 2015-10-16 ENCOUNTER — Other Ambulatory Visit: Payer: Self-pay | Admitting: Orthopedic Surgery

## 2015-10-16 DIAGNOSIS — M25511 Pain in right shoulder: Secondary | ICD-10-CM

## 2015-10-27 ENCOUNTER — Ambulatory Visit
Admission: RE | Admit: 2015-10-27 | Discharge: 2015-10-27 | Disposition: A | Discharge status: Home / Self Care | Payer: 59 | Source: Ambulatory Visit | Attending: Orthopedic Surgery | Admitting: Orthopedic Surgery

## 2015-10-27 DIAGNOSIS — M25511 Pain in right shoulder: Secondary | ICD-10-CM

## 2015-11-14 ENCOUNTER — Other Ambulatory Visit: Payer: Self-pay | Admitting: Orthopedic Surgery

## 2015-12-03 ENCOUNTER — Encounter (HOSPITAL_COMMUNITY)
Admission: RE | Admit: 2015-12-03 | Discharge: 2015-12-03 | Disposition: A | Discharge status: Home / Self Care | Payer: 59 | Source: Ambulatory Visit | Attending: Orthopedic Surgery | Admitting: Orthopedic Surgery

## 2015-12-03 ENCOUNTER — Encounter (HOSPITAL_COMMUNITY): Payer: Self-pay

## 2015-12-03 DIAGNOSIS — Z01812 Encounter for preprocedural laboratory examination: Secondary | ICD-10-CM | POA: Diagnosis not present

## 2015-12-03 LAB — CBC
HCT: 45.4 % (ref 39.0–52.0)
HEMOGLOBIN: 15.8 g/dL (ref 13.0–17.0)
MCH: 29.6 pg (ref 26.0–34.0)
MCHC: 34.8 g/dL (ref 30.0–36.0)
MCV: 85 fL (ref 78.0–100.0)
PLATELETS: 118 10*3/uL — AB (ref 150–400)
RBC: 5.34 MIL/uL (ref 4.22–5.81)
RDW: 12.6 % (ref 11.5–15.5)
WBC: 4.6 10*3/uL (ref 4.0–10.5)

## 2015-12-03 LAB — BASIC METABOLIC PANEL
ANION GAP: 5 (ref 5–15)
BUN: 12 mg/dL (ref 6–20)
CO2: 27 mmol/L (ref 22–32)
Calcium: 9 mg/dL (ref 8.9–10.3)
Chloride: 106 mmol/L (ref 101–111)
Creatinine, Ser: 1.15 mg/dL (ref 0.61–1.24)
Glucose, Bld: 92 mg/dL (ref 65–99)
POTASSIUM: 4.4 mmol/L (ref 3.5–5.1)
SODIUM: 138 mmol/L (ref 135–145)

## 2015-12-03 NOTE — Pre-Procedure Instructions (Signed)
Valetta FullerDennis J [facility]  12/03/2015      [facility] [phone] - [place], [place] - [address] [place] [id number] Phone: [phone] Fax: [phone]    Your procedure is scheduled on Tuesday October 24  [facility] Admitting at 1100 A.M.  Call this number if you have problems the morning of surgery:  [phone]   Remember:  Do not eat food or drink liquids after midnight.   Take these medicines the morning of surgery with A SIP OF WATER NONE  7 days prior to surgery STOP taking any MOBIC, Aspirin, Aleve, Naproxen, Ibuprofen, Motrin, Advil, Goody's, BC's, all herbal medications, fish oil, and all vitamins    Do not wear jewelry.  Do not wear lotions, powders, or cologne, or deoderant.  Men may shave face and neck.  Do not bring valuables to the hospital.  [facility] is not responsible for any belongings or valuables.  Contacts, dentures or bridgework may not be worn into surgery.  Leave your suitcase in the car.  After surgery it may be brought to your room.  For patients admitted to the hospital, discharge time will be determined by your treatment team.  Patients discharged the day of surgery will not be allowed to drive home.    Special instructions:   [place]- Preparing For Surgery  Before surgery, you can play an important role. Because skin is not sterile, your skin needs to be as free of germs as possible. You can reduce the number of germs on your skin by washing with CHG (chlorahexidine gluconate) Soap before surgery.  CHG is an antiseptic cleaner which kills germs and bonds with the skin to continue killing germs even after washing.  Please do not use if you have an allergy to CHG or antibacterial soaps. If your skin becomes reddened/irritated stop using the CHG.  Do not shave (including legs and underarms) for at least 48 hours prior to first CHG shower. It is OK to shave  your face.  Please follow these instructions carefully.   1. Shower the NIGHT BEFORE SURGERY and the MORNING OF SURGERY with CHG.   2. If you chose to wash your hair, wash your hair first as usual with your normal shampoo.  3. After you shampoo, rinse your hair and body thoroughly to remove the shampoo.  4. Use CHG as you would any other liquid soap. You can apply CHG directly to the skin and wash gently with a scrungie or a clean washcloth.   5. Apply the CHG Soap to your body ONLY FROM THE NECK DOWN.  Do not use on open wounds or open sores. Avoid contact with your eyes, ears, mouth and genitals (private parts). Wash genitals (private parts) with your normal soap.  6. Wash thoroughly, paying special attention to the area where your surgery will be performed.  7. Thoroughly rinse your body with warm water from the neck down.  8. DO NOT shower/wash with your normal soap after using and rinsing off the CHG Soap.  9. Pat yourself dry with a CLEAN TOWEL.   10. Wear CLEAN PAJAMAS   11. Place CLEAN SHEETS on your bed the night of your first shower and DO NOT SLEEP WITH PETS.    Day of Surgery: Do not apply any deodorants/lotions. Please wear clean clothes to the hospital/surgery center.      Please read  over the following fact sheets that you were given.

## 2015-12-03 NOTE — Progress Notes (Signed)
PCP - Lawerence Fusco Cardiologist - denies  Chest x-ray - not needed EKG - not needed - no cardiac history, not on any blood pressure medications. Stress Test - denies ECHO - denies Cardiac Cath - denies      Patient denies shortness of breath, fever, cough and chest pain at PAT appointment

## 2015-12-08 MED ORDER — DEXTROSE 5 % IV SOLN
3.0000 g | INTRAVENOUS | Status: AC
  Administered 2015-12-09: 3 g via INTRAVENOUS
  Filled 2015-12-08: qty 3000

## 2015-12-09 ENCOUNTER — Ambulatory Visit (HOSPITAL_COMMUNITY)
Admission: RE | Admit: 2015-12-09 | Discharge: 2015-12-09 | Disposition: A | Discharge status: Home / Self Care | Payer: 59 | Source: Ambulatory Visit | Attending: Orthopedic Surgery | Admitting: Orthopedic Surgery

## 2015-12-09 ENCOUNTER — Encounter (HOSPITAL_COMMUNITY)
Admission: RE | Disposition: A | Discharge status: Home / Self Care | Payer: Self-pay | Source: Ambulatory Visit | Attending: Orthopedic Surgery

## 2015-12-09 ENCOUNTER — Ambulatory Visit (HOSPITAL_COMMUNITY): Payer: 59 | Admitting: Emergency Medicine

## 2015-12-09 ENCOUNTER — Encounter (HOSPITAL_COMMUNITY): Payer: Self-pay | Admitting: *Deleted

## 2015-12-09 ENCOUNTER — Ambulatory Visit (HOSPITAL_COMMUNITY): Payer: 59 | Admitting: Anesthesiology

## 2015-12-09 DIAGNOSIS — S43081A Other subluxation of right shoulder joint, initial encounter: Secondary | ICD-10-CM | POA: Diagnosis not present

## 2015-12-09 DIAGNOSIS — Z885 Allergy status to narcotic agent status: Secondary | ICD-10-CM | POA: Insufficient documentation

## 2015-12-09 DIAGNOSIS — X58XXXA Exposure to other specified factors, initial encounter: Secondary | ICD-10-CM | POA: Insufficient documentation

## 2015-12-09 DIAGNOSIS — S43431A Superior glenoid labrum lesion of right shoulder, initial encounter: Secondary | ICD-10-CM

## 2015-12-09 DIAGNOSIS — M75121 Complete rotator cuff tear or rupture of right shoulder, not specified as traumatic: Secondary | ICD-10-CM | POA: Diagnosis not present

## 2015-12-09 DIAGNOSIS — M7581 Other shoulder lesions, right shoulder: Secondary | ICD-10-CM | POA: Insufficient documentation

## 2015-12-09 DIAGNOSIS — M19011 Primary osteoarthritis, right shoulder: Secondary | ICD-10-CM | POA: Insufficient documentation

## 2015-12-09 DIAGNOSIS — M7521 Bicipital tendinitis, right shoulder: Secondary | ICD-10-CM | POA: Diagnosis not present

## 2015-12-09 HISTORY — PX: SHOULDER ARTHROSCOPY WITH SUBACROMIAL DECOMPRESSION, ROTATOR CUFF REPAIR AND BICEP TENDON REPAIR: SHX5687

## 2015-12-09 SURGERY — SHOULDER ARTHROSCOPY WITH SUBACROMIAL DECOMPRESSION, ROTATOR CUFF REPAIR AND BICEP TENDON REPAIR
Anesthesia: Regional | Site: Shoulder | Laterality: Right

## 2015-12-09 MED ORDER — FENTANYL CITRATE (PF) 100 MCG/2ML IJ SOLN
INTRAMUSCULAR | Status: DC | PRN
  Administered 2015-12-09 (×2): 50 ug via INTRAVENOUS
  Administered 2015-12-09: 100 ug via INTRAVENOUS

## 2015-12-09 MED ORDER — MIDAZOLAM HCL 5 MG/5ML IJ SOLN
INTRAMUSCULAR | Status: DC | PRN
  Administered 2015-12-09: 2 mg via INTRAVENOUS

## 2015-12-09 MED ORDER — LIDOCAINE HCL (CARDIAC) 20 MG/ML IV SOLN
INTRAVENOUS | Status: DC | PRN
Start: 2015-12-09 — End: 2015-12-09
  Administered 2015-12-09: 20 mg via INTRAVENOUS

## 2015-12-09 MED ORDER — DEXAMETHASONE SODIUM PHOSPHATE 10 MG/ML IJ SOLN
INTRAMUSCULAR | Status: DC | PRN
  Administered 2015-12-09: 10 mg via INTRAVENOUS

## 2015-12-09 MED ORDER — ROCURONIUM BROMIDE 10 MG/ML (PF) SYRINGE
PREFILLED_SYRINGE | INTRAVENOUS | Status: AC
Start: 2015-12-09 — End: 2015-12-09
  Filled 2015-12-09: qty 10

## 2015-12-09 MED ORDER — SUGAMMADEX SODIUM 200 MG/2ML IV SOLN
INTRAVENOUS | Status: AC
  Filled 2015-12-09: qty 2

## 2015-12-09 MED ORDER — PROPOFOL 10 MG/ML IV BOLUS
INTRAVENOUS | Status: AC
Start: 2015-12-09 — End: 2015-12-09
  Filled 2015-12-09: qty 20

## 2015-12-09 MED ORDER — FENTANYL CITRATE (PF) 100 MCG/2ML IJ SOLN
INTRAMUSCULAR | Status: AC
  Administered 2015-12-09: 100 ug via INTRAVENOUS
  Filled 2015-12-09: qty 2

## 2015-12-09 MED ORDER — ONDANSETRON HCL 4 MG/2ML IJ SOLN
INTRAMUSCULAR | Status: AC
  Filled 2015-12-09: qty 2

## 2015-12-09 MED ORDER — EPINEPHRINE PF 1 MG/ML IJ SOLN
INTRAMUSCULAR | Status: AC
  Filled 2015-12-09: qty 1

## 2015-12-09 MED ORDER — FENTANYL CITRATE (PF) 100 MCG/2ML IJ SOLN
100.0000 ug | Freq: Once | INTRAMUSCULAR | Status: AC
  Administered 2015-12-09: 100 ug via INTRAVENOUS

## 2015-12-09 MED ORDER — EPINEPHRINE PF 1 MG/ML IJ SOLN
INTRAMUSCULAR | Status: DC | PRN
  Administered 2015-12-09: .1 mL

## 2015-12-09 MED ORDER — MIDAZOLAM HCL 2 MG/2ML IJ SOLN
INTRAMUSCULAR | Status: AC
  Administered 2015-12-09: 2 mg via INTRAVENOUS
  Filled 2015-12-09: qty 2

## 2015-12-09 MED ORDER — ROCURONIUM BROMIDE 100 MG/10ML IV SOLN
INTRAVENOUS | Status: DC | PRN
  Administered 2015-12-09: 50 mg via INTRAVENOUS
  Administered 2015-12-09: 20 mg via INTRAVENOUS

## 2015-12-09 MED ORDER — SODIUM CHLORIDE 0.9 % IR SOLN
Status: DC | PRN
  Administered 2015-12-09: 6000 mL

## 2015-12-09 MED ORDER — LIDOCAINE 2% (20 MG/ML) 5 ML SYRINGE
INTRAMUSCULAR | Status: AC
Start: 2015-12-09 — End: 2015-12-09
  Filled 2015-12-09: qty 5

## 2015-12-09 MED ORDER — PROMETHAZINE HCL 25 MG/ML IJ SOLN
6.2500 mg | Freq: Once | INTRAMUSCULAR | Status: AC
  Administered 2015-12-09: 6.25 mg via INTRAVENOUS

## 2015-12-09 MED ORDER — LACTATED RINGERS IV SOLN
INTRAVENOUS | Status: DC | PRN
  Administered 2015-12-09 (×2): via INTRAVENOUS

## 2015-12-09 MED ORDER — FENTANYL CITRATE (PF) 100 MCG/2ML IJ SOLN
INTRAMUSCULAR | Status: AC
  Filled 2015-12-09: qty 2

## 2015-12-09 MED ORDER — ONDANSETRON HCL 4 MG/2ML IJ SOLN
INTRAMUSCULAR | Status: DC | PRN
  Administered 2015-12-09 (×2): 4 mg via INTRAVENOUS

## 2015-12-09 MED ORDER — FENTANYL CITRATE (PF) 100 MCG/2ML IJ SOLN: 25.0000 ug | INTRAMUSCULAR | Status: DC | PRN

## 2015-12-09 MED ORDER — SUGAMMADEX SODIUM 200 MG/2ML IV SOLN
INTRAVENOUS | Status: DC | PRN
  Administered 2015-12-09: 230 mg via INTRAVENOUS

## 2015-12-09 MED ORDER — MIDAZOLAM HCL 2 MG/2ML IJ SOLN
2.0000 mg | Freq: Once | INTRAMUSCULAR | Status: AC
  Administered 2015-12-09: 2 mg via INTRAVENOUS

## 2015-12-09 MED ORDER — MIDAZOLAM HCL 2 MG/2ML IJ SOLN
INTRAMUSCULAR | Status: AC
  Filled 2015-12-09: qty 2

## 2015-12-09 MED ORDER — CHLORHEXIDINE GLUCONATE 4 % EX LIQD: 60.0000 mL | Freq: Once | CUTANEOUS | Status: DC

## 2015-12-09 MED ORDER — OXYCODONE-ACETAMINOPHEN 10-325 MG PO TABS: 1.0000 | ORAL_TABLET | Freq: Four times a day (QID) | ORAL | 0 refills | Status: DC | PRN

## 2015-12-09 MED ORDER — SODIUM CHLORIDE 0.9 % IJ SOLN
INTRAMUSCULAR | Status: DC | PRN
  Administered 2015-12-09: 30 mL

## 2015-12-09 MED ORDER — BUPIVACAINE-EPINEPHRINE (PF) 0.5% -1:200000 IJ SOLN
INTRAMUSCULAR | Status: DC | PRN
  Administered 2015-12-09: 30 mL via PERINEURAL

## 2015-12-09 MED ORDER — PROPOFOL 10 MG/ML IV BOLUS
INTRAVENOUS | Status: DC | PRN
  Administered 2015-12-09: 200 mg via INTRAVENOUS

## 2015-12-09 MED ORDER — DEXAMETHASONE SODIUM PHOSPHATE 10 MG/ML IJ SOLN
INTRAMUSCULAR | Status: AC
  Filled 2015-12-09: qty 1

## 2015-12-09 MED ORDER — METHOCARBAMOL 500 MG PO TABS: 500.0000 mg | ORAL_TABLET | Freq: Three times a day (TID) | ORAL | 0 refills | Status: DC | PRN

## 2015-12-09 MED ORDER — PROMETHAZINE HCL 25 MG/ML IJ SOLN
INTRAMUSCULAR | Status: AC
  Filled 2015-12-09: qty 1

## 2015-12-09 MED ORDER — PHENYLEPHRINE HCL 10 MG/ML IJ SOLN
INTRAVENOUS | Status: DC | PRN
  Administered 2015-12-09: 10 ug/min via INTRAVENOUS

## 2015-12-09 MED ORDER — LACTATED RINGERS IV SOLN: INTRAVENOUS | Status: DC

## 2015-12-09 MED ORDER — EPHEDRINE 5 MG/ML INJ
INTRAVENOUS | Status: AC
Start: 2015-12-09 — End: 2015-12-09
  Filled 2015-12-09: qty 10

## 2015-12-09 SURGICAL SUPPLY — 77 items
AID PSTN UNV HD RSTRNT DISP (MISCELLANEOUS) ×1
ANCH SUT 2 CRKSRW FT 14X4.5 (Anchor) ×1 IMPLANT
ANCH SUT CRKSW FT 1.3X (Anchor) ×3 IMPLANT
ANCH SUT PUSHLCK 24X4.5 STRL (Orthopedic Implant) ×2 IMPLANT
ANCHOR PEEK CORKSCREW 4.5 (Anchor) ×1 IMPLANT
ANCHOR PEEK CORKSCREW 4.5MM (Anchor) ×1 IMPLANT
ANCHOR SUT BIOCOMP CORKSREW (Anchor) ×6 IMPLANT
APL SKNCLS STERI-STRIP NONHPOA (GAUZE/BANDAGES/DRESSINGS) ×1
BENZOIN TINCTURE PRP APPL 2/3 (GAUZE/BANDAGES/DRESSINGS) ×3 IMPLANT
BLADE CUTTER GATOR 3.5 (BLADE) ×3 IMPLANT
BLADE GREAT WHITE 4.2 (BLADE) ×2 IMPLANT
BLADE GREAT WHITE 4.2MM (BLADE) ×1
BLADE SURG 11 STRL SS (BLADE) ×3 IMPLANT
BUR OVAL 6.0 (BURR) ×3 IMPLANT
CLOSURE STERI-STRIP 1/2X4 (GAUZE/BANDAGES/DRESSINGS) ×1
CLOSURE WOUND 1/2 X4 (GAUZE/BANDAGES/DRESSINGS) ×1
CLSR STERI-STRIP ANTIMIC 1/2X4 (GAUZE/BANDAGES/DRESSINGS) ×1 IMPLANT
COVER SURGICAL LIGHT HANDLE (MISCELLANEOUS) ×3 IMPLANT
DRAPE INCISE IOBAN 66X45 STRL (DRAPES) ×6 IMPLANT
DRAPE STERI 35X30 U-POUCH (DRAPES) ×3 IMPLANT
DRAPE U-SHAPE 47X51 STRL (DRAPES) ×6 IMPLANT
DRSG PAD ABDOMINAL 8X10 ST (GAUZE/BANDAGES/DRESSINGS) ×9 IMPLANT
DRSG TEGADERM 4X4.75 (GAUZE/BANDAGES/DRESSINGS) ×3 IMPLANT
DURAPREP 26ML APPLICATOR (WOUND CARE) ×3 IMPLANT
ELECT CAUTERY BLADE 6.4 (BLADE) ×2 IMPLANT
ELECT REM PT RETURN 9FT ADLT (ELECTROSURGICAL) ×3
ELECTRODE REM PT RTRN 9FT ADLT (ELECTROSURGICAL) ×1 IMPLANT
FILTER STRAW FLUID ASPIR (MISCELLANEOUS) ×3 IMPLANT
GAUZE SPONGE 4X4 12PLY STRL (GAUZE/BANDAGES/DRESSINGS) ×3 IMPLANT
GAUZE XEROFORM 1X8 LF (GAUZE/BANDAGES/DRESSINGS) ×3 IMPLANT
GLOVE BIOGEL PI IND STRL 8 (GLOVE) ×1 IMPLANT
GLOVE BIOGEL PI INDICATOR 8 (GLOVE) ×2
GLOVE SURG ORTHO 8.0 STRL STRW (GLOVE) ×3 IMPLANT
GOWN STRL REUS W/ TWL LRG LVL3 (GOWN DISPOSABLE) ×3 IMPLANT
GOWN STRL REUS W/TWL LRG LVL3 (GOWN DISPOSABLE) ×9
KIT BASIN OR (CUSTOM PROCEDURE TRAY) ×3 IMPLANT
KIT ROOM TURNOVER OR (KITS) ×3 IMPLANT
MANIFOLD NEPTUNE II (INSTRUMENTS) ×3 IMPLANT
NDL HYPO 25X1 1.5 SAFETY (NEEDLE) ×1 IMPLANT
NDL SCORPION MULTI FIRE (NEEDLE) ×1 IMPLANT
NDL SPNL 18GX3.5 QUINCKE PK (NEEDLE) ×1 IMPLANT
NDL SUT 6 .5 CRC .975X.05 MAYO (NEEDLE) ×1 IMPLANT
NEEDLE HYPO 25X1 1.5 SAFETY (NEEDLE) ×3 IMPLANT
NEEDLE MAYO TAPER (NEEDLE) ×3
NEEDLE SCORPION MULTI FIRE (NEEDLE) ×3 IMPLANT
NEEDLE SPNL 18GX3.5 QUINCKE PK (NEEDLE) ×3 IMPLANT
NS IRRIG 1000ML POUR BTL (IV SOLUTION) ×3 IMPLANT
PACK SHOULDER (CUSTOM PROCEDURE TRAY) ×3 IMPLANT
PAD ARMBOARD 7.5X6 YLW CONV (MISCELLANEOUS) ×6 IMPLANT
PUSHLOCK PEEK 4.5X24 (Orthopedic Implant) ×5 IMPLANT
RESTRAINT HEAD UNIVERSAL NS (MISCELLANEOUS) ×3 IMPLANT
SCREW TENODESIS BIOCOMP 7MM (Screw) ×2 IMPLANT
SET ARTHROSCOPY TUBING (MISCELLANEOUS) ×3
SET ARTHROSCOPY TUBING LN (MISCELLANEOUS) ×1 IMPLANT
SLING ARM IMMOBILIZER LRG (SOFTGOODS) IMPLANT
SPONGE GAUZE 4X4 12PLY STER LF (GAUZE/BANDAGES/DRESSINGS) ×2 IMPLANT
SPONGE LAP 4X18 X RAY DECT (DISPOSABLE) ×6 IMPLANT
STRIP CLOSURE SKIN 1/2X4 (GAUZE/BANDAGES/DRESSINGS) ×2 IMPLANT
SUCTION FRAZIER HANDLE 10FR (MISCELLANEOUS) ×2
SUCTION TUBE FRAZIER 10FR DISP (MISCELLANEOUS) ×1 IMPLANT
SUT ETHILON 3 0 PS 1 (SUTURE) ×3 IMPLANT
SUT FIBERWIRE #2 38 T-5 BLUE (SUTURE)
SUT PROLENE 3 0 PS 2 (SUTURE) ×3 IMPLANT
SUT VIC AB 0 CT1 27 (SUTURE) ×6
SUT VIC AB 0 CT1 27XBRD ANBCTR (SUTURE) ×2 IMPLANT
SUT VIC AB 1 CT1 27 (SUTURE) ×3
SUT VIC AB 1 CT1 27XBRD ANBCTR (SUTURE) ×1 IMPLANT
SUT VIC AB 2-0 CT1 27 (SUTURE) ×3
SUT VIC AB 2-0 CT1 TAPERPNT 27 (SUTURE) ×1 IMPLANT
SUTURE FIBERWR #2 38 T-5 BLUE (SUTURE) IMPLANT
SYR 20CC LL (SYRINGE) ×6 IMPLANT
SYR 3ML LL SCALE MARK (SYRINGE) ×3 IMPLANT
SYR TB 1ML LUER SLIP (SYRINGE) ×3 IMPLANT
TOWEL OR 17X24 6PK STRL BLUE (TOWEL DISPOSABLE) ×3 IMPLANT
TOWEL OR 17X26 10 PK STRL BLUE (TOWEL DISPOSABLE) ×3 IMPLANT
WAND HAND CNTRL MULTIVAC 90 (MISCELLANEOUS) IMPLANT
WATER STERILE IRR 1000ML POUR (IV SOLUTION) ×3 IMPLANT

## 2015-12-09 NOTE — Anesthesia Postprocedure Evaluation (Signed)
Anesthesia Post Note  Patient: Josephine CablesDennis J Roussell  Procedure(s) Performed: Procedure(s) (LRB): SHOULDER DIAGNOSTIC OPERATIVE ARTHROSCOPY, SUBACROMIAL DECOMPRESSION, DISTAL CLAVICLE EXCISION, MINI-OPEN ROTATOR CUFF REPAIR AND BICEP TENODESIS. (Right)  Patient location during evaluation: PACU Anesthesia Type: General and Regional Level of consciousness: awake and alert Pain management: pain level controlled Vital Signs Assessment: post-procedure vital signs reviewed and stable Respiratory status: spontaneous breathing, nonlabored ventilation and respiratory function stable Cardiovascular status: blood pressure returned to baseline and stable Postop Assessment: no signs of nausea or vomiting Anesthetic complications: no    Last Vitals:  Vitals:   12/09/15 1550 12/09/15 1557  BP: (!) 107/58 (!) 113/54  Pulse: 76 70  Resp: 18 18  Temp: 36.8 C     Last Pain:  Vitals:   12/09/15 1557  TempSrc:   PainSc: 0-No pain                 Lafonda Patron,W. EDMOND

## 2015-12-09 NOTE — Anesthesia Procedure Notes (Addendum)
Anesthesia Regional Block:  Interscalene brachial plexus block  Pre-Anesthetic Checklist: ,, timeout performed, Correct Patient, Correct Site, Correct Laterality, Correct Procedure, Correct Position, site marked, Risks and benefits discussed, pre-op evaluation,  At surgeon's request and post-op pain management  Laterality: Right  Prep: Maximum Sterile Barrier Precautions used, chloraprep       Needles:  Injection technique: Single-shot  Needle Type: Echogenic Stimulator Needle     Needle Length: 5cm 5 cm Needle Gauge: 22 and 22 G    Additional Needles:  Procedures: ultrasound guided (picture in chart) and nerve stimulator Interscalene brachial plexus block  Nerve Stimulator or Paresthesia:  Response: Biceps response,   Additional Responses:   Narrative:  Start time: 12/09/2015 9:50 AM End time: 12/09/2015 10:00 AM Injection made incrementally with aspirations every 5 mL. Anesthesiologist: Gaynelle AduFITZGERALD, Kazmir Oki  Additional Notes: 2% Lidocaine skin wheel.

## 2015-12-09 NOTE — Transfer of Care (Signed)
Immediate Anesthesia Transfer of Care Note  Patient: [name] Kelley  Procedure(s) Performed: Procedure(s): SHOULDER DIAGNOSTIC OPERATIVE ARTHROSCOPY, SUBACROMIAL DECOMPRESSION, DISTAL CLAVICLE EXCISION, MINI-OPEN ROTATOR CUFF REPAIR AND BICEP TENODESIS. (Right)  Patient Location: PACU  Anesthesia Type:GA combined with regional for post-op pain  Level of Consciousness: oriented, sedated, patient cooperative and responds to stimulation  Airway & Oxygen Therapy: Patient Spontanous Breathing and Patient connected to nasal cannula oxygen  Post-op Assessment: Report given to RN, Post -op Vital signs reviewed and stable, Patient moving all extremities and Patient moving all extremities X 4  Post vital signs: Reviewed and stable  Last Vitals:  Vitals:   12/09/15 0907 12/09/15 1502  BP: 137/62 (!) 109/46  Pulse: 66 85  Resp: 20 (!) 28  Temp: 36.7 C 36.3 C    Last Pain:  Vitals:   12/09/15 0907  TempSrc: Oral         Complications: No apparent anesthesia complications

## 2015-12-09 NOTE — H&P (Signed)
[name] Kelley is an 30 y.o. male.   Chief Complaint: Right shoulder pain HPI: [name] Kelley is a 30 year old patient with right shoulder pain.  Had an injury several months ago.  His had distinct popping and relocation symptoms of tissue in the front of his shoulder.  The shoulder itself is not dislocating.  MRI scans consistent with intense edema at the distal end of the clavicle along with upper subscap tear and intra-articular biceps tendon tendinosis.  He is failed conservative management and presents now for operative management of intra-articular-articular shoulder joint pathology  Past Medical History:  Diagnosis Date  . Medical history non-contributory     Past Surgical History:  Procedure Laterality Date  . Arm Manipulation Right 1995?  . INCISION AND DRAINAGE ABSCESS N/A 01/05/2013   Procedure: INCISION AND DRAINAGE PERIANAL  ABSCESS;  Surgeon: Dalia HeadingMark A Jenkins, MD;  Location: [facility];  Service: General;  Laterality: N/A;  . widsom teeth  2017    History reviewed. No pertinent family history. Social History:  reports that he has never smoked. He has never used smokeless tobacco. He reports that he does not drink alcohol or use drugs.  Allergies:  Allergies  Allergen Reactions  . Hydrocodone Swelling    Tongue swelling    Medications Prior to Admission  Medication Sig Dispense Refill  . meloxicam (MOBIC) 15 MG tablet Take 1 tablet (15 mg total) by mouth daily. (Patient not taking: Reported on 12/02/2015) 30 tablet 5    No results found for this or any previous visit (from the past 48 hour(s)). No results found.  Review of Systems  Constitutional: Negative.   HENT: Negative.   Eyes: Negative.   Respiratory: Negative.   Cardiovascular: Negative.   Gastrointestinal: Negative.   Genitourinary: Negative.   Musculoskeletal: Positive for joint pain.  Skin: Negative.   Neurological: Negative.   Endo/Heme/Allergies: Negative.   Psychiatric/Behavioral: Negative.     Blood  pressure 137/62, pulse 66, temperature 98.1 F (36.7 C), temperature source Oral, resp. rate 20, height 6\' 1"  (1.854 m), weight 255 lb (115.7 kg), SpO2 98 %. Physical Exam  Constitutional: He appears well-developed.  HENT:  Head: Normocephalic.  Eyes: Pupils are equal, round, and reactive to light.  Neck: Normal range of motion.  Cardiovascular: Normal rate.   Respiratory: Effort normal.  Neurological: He is alert.  Skin: Skin is warm.  Psychiatric: He has a normal mood and affect.   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  Upper subscapularis tear is also present which is compatible with this phenomenon.  Plan arthroscopy biceps tendon release biceps tenodesis.  SLAP tear is present.  Also perform subacromial decompression with arthroscopic distal clavicle excision risk and benefits discussed with the patient couldn't limited to infection or vessel damage incomplete healing shoulder stiffness all questions answered plan outpatient procedure discharge home same day with CPM use to begin tomorrow  Burnard BuntingG Scott Ann Bohne, MD 12/09/2015, 10:43 AM

## 2015-12-09 NOTE — Anesthesia Procedure Notes (Signed)
Procedure Name: Intubation Date/Time: 12/09/2015 11:18 AM Performed by: Leonel Ramsay'LAUGHLIN, Trevel Dillenbeck H Pre-anesthesia Checklist: Patient identified, Emergency Drugs available, Suction available, Patient being monitored and Timeout performed Patient Re-evaluated:Patient Re-evaluated prior to inductionOxygen Delivery Method: Circle system utilized Preoxygenation: Pre-oxygenation with 100% oxygen Intubation Type: IV induction Ventilation: Mask ventilation without difficulty Laryngoscope Size: Mac and 4 Grade View: Grade I Tube type: Oral Tube size: 7.0 mm Number of attempts: 1 Airway Equipment and Method: Stylet Placement Confirmation: ETT inserted through vocal cords under direct vision,  positive ETCO2 and breath sounds checked- equal and bilateral Secured at: 23 cm Tube secured with: Tape Dental Injury: Teeth and Oropharynx as per pre-operative assessment

## 2015-12-09 NOTE — Anesthesia Preprocedure Evaluation (Addendum)
Anesthesia Evaluation  Patient identified by MRN, date of birth, ID band Patient awake    Reviewed: Allergy & Precautions, H&P , NPO status , Patient's Chart, lab work & pertinent test results  Airway Mallampati: II  TM Distance: >3 FB Neck ROM: Full    Dental no notable dental hx. (+) Teeth Intact, Dental Advisory Given   Pulmonary neg pulmonary ROS,    Pulmonary exam normal breath sounds clear to auscultation       Cardiovascular negative cardio ROS   Rhythm:Regular Rate:Normal     Neuro/Psych negative neurological ROS  negative psych ROS   GI/Hepatic negative GI ROS, Neg liver ROS,   Endo/Other  negative endocrine ROS  Renal/GU negative Renal ROS  negative genitourinary   Musculoskeletal   Abdominal   Peds  Hematology negative hematology ROS (+)   Anesthesia Other Findings   Reproductive/Obstetrics negative OB ROS                            Anesthesia Physical Anesthesia Plan  ASA: II  Anesthesia Plan: General and Regional   Post-op Pain Management: GA combined w/ Regional for post-op pain   Induction: Intravenous  Airway Management Planned: Oral ETT  Additional Equipment:   Intra-op Plan:   Post-operative Plan: Extubation in OR  Informed Consent: I have reviewed the patients History and Physical, chart, labs and discussed the procedure including the risks, benefits and alternatives for the proposed anesthesia with the patient or authorized representative who has indicated his/her understanding and acceptance.   Dental advisory given  Plan Discussed with: CRNA  Anesthesia Plan Comments:         Anesthesia Quick Evaluation

## 2015-12-09 NOTE — Brief Op Note (Signed)
12/09/2015  2:57 PM  PATIENT:  [name] Kelley  30 y.o. male  PRE-OPERATIVE DIAGNOSIS:  Right Shoulder Acromioclavicular Osteoarthritis, Subscapularis Tear, Biceps Subluxation and tendonitis  POST-OPERATIVE DIAGNOSIS:  Right Shoulder Acromioclavicular Osteoarthritis, Subscapularis Tear, Biceps Subluxation supra tear and biceps tendonitis  PROCEDURE:  Procedure(s): SHOULDER DIAGNOSTIC OPERATIVE ARTHROSCOPY, SUBACROMIAL DECOMPRESSION, DISTAL CLAVICLE EXCISION, MINI-OPEN ROTATOR CUFF REPAIR AND BICEP TENODESIS.  SURGEON:  Surgeon(s): Cammy CopaScott Willodene Stallings, MD  ASSISTANT: Patrick Jupiterarla Bethune rnfa  ANESTHESIA:   general  EBL: 50 ml    Total I/O In: 1000 [I.V.:1000] Out: 75 [Blood:75]  BLOOD ADMINISTERED: none  DRAINS: none   LOCAL MEDICATIONS USED:  none  SPECIMEN:  No Specimen  COUNTS:  YES  TOURNIQUET:  * No tourniquets in log *  DICTATION: .Other Dictation: Dictation Number [id number]  PLAN OF CARE: Discharge to home after PACU  PATIENT DISPOSITION:  PACU - hemodynamically stable

## 2015-12-10 ENCOUNTER — Telehealth (INDEPENDENT_AMBULATORY_CARE_PROVIDER_SITE_OTHER): Payer: Self-pay

## 2015-12-10 NOTE — Op Note (Signed)
[name] Kelley, [name] Kelley                 ACCOUNT NO.:  [id number]  MEDICAL RECORD NO.:  [id number]  LOCATION:                                 FACILITY:  PHYSICIAN:  Burnard Bunting, MD      DATE OF BIRTH:  1986-02-03  DATE OF PROCEDURE: DATE OF DISCHARGE:                              OPERATIVE REPORT   PREOPERATIVE DIAGNOSES:  Right shoulder biceps tendon subluxation, SLAP tear, biceps tendonitis, upper portion subscap tear, AC joint arthritis.  POSTOPERATIVE DIAGNOSES:  Right shoulder SLAP tear, biceps tendon tendinitis, and subluxation with subscap tear as well as supraspinatus tear measuring about 1 x 2 cm, U-shaped with AC joint arthritis and bursitis.  PROCEDURE PERFORMED:  Right shoulder arthroscopy, debridement of SLAP tear, release of the biceps tendon, arthroscopic distal clavicle excision, mini open rotator cuff repair, both the subscapularis and the supraspinatus with biceps tenodesis.  SURGEON:  Burnard Bunting, MD.  ASSISTANT:  Patrick Jupiter, RNFA.  INDICATIONS:  [name] Kelley is a 30 year old patient with right shoulder pain following an injury presents now for operative management after explanation of risks and benefits.  DESCRIPTION OF PROCEDURE:  The patient was brought to operating room where general anesthetic was induced.  Preoperative antibiotics were administered.  Time-out was called.  The patient was placed in the beach- chair position with the head in neutral position.  Right arm, axilla, and hand prescrubbed with alcohol and Betadine, allowed to air dry, prepped with DuraPrep solution, and draped in sterile manner.  [name] was used to cover the operative field.  Time-out was called.  Solution of saline and epinephrine were injected into subacromial space.  Posterior portal created 2 cm medial and inferior to the posterolateral margin of the acromion.  Diagnostic arthroscopy was performed.  SLAP tear was present.  Significant biceps tendonitis was present.  Anterior  portal created under direct visualization which was in line with the distal end of clavicle which was determined by puncturing the joint with 25-gauge needle just to determine the linearity of the distal clavicle.  The anterior portal created.  Debridement was performed of both the superior labrum of the biceps tendon.  Biceps tendon was released.  The patient also had a rotator cuff tear, both the superior portion of the subscap as well as the supraspinatus.  The supraspinatus was a U shaped tear which when debrided measured about 1 x 1.5 cm.  Following this, the scope was placed into the subacromial space.  Lateral portal was created.  Debridement was performed of the bursa.  The distal clavicle was then excised about 8-9 mm through the anterior portal.  The superior and posterior ligaments were preserved.  Following this the instruments were removed.  Portals were closed using 3-0 nylon.  [name] was then used to cover the superior aspect of the shoulder and incision was made at the anterolateral margin of the acromion.  Skin and subcutaneous tissue were sharply divided.  Deltoid split measured distance of 4 cm. Bursectomy performed.  Rotator cuff tear identified.  Biceps tendon was then tenodesed within the bicipital groove using Arthrex tenodesis screw, 7 x 23 mm.  Subscapularis was then repaired using a 4.5  corkscrew which was made of PEEK.  Two mattress sutures placed in this and then these sutures were then used to anchor into a PushLock during the later portion of procedure.  Supraspinatus tear was identified, debrided. Footprint debrided.  Two 5.5 corkscrew suture anchors were laced in the 4 limbs from each of these corkscrews were placed through the medial aspect of the tendon.  The U was closed to more of a V-shaped tear using 2-0 FiberWire suture placed in inverted fashion and the mattress sutures were tied and crossed and then secured using 2 PushLock on the metaphyseal region  of the humerus.  This gave a very watertight repair. Two sutures from the subscap repair also incorporated into the PushLock anteriorly.  At this time, thorough irrigation was performed.  Deltoid split closed using 0 Vicryl suture followed by interrupted inverted 0 Vicryl suture and 3-0 Monocryl.  Impervious dressings placed.  The patient tolerated the procedure well without immediate complication. Transferred to recovery room in stable condition.     Burnard Bunting, M.D.   ______________________________ Reece Agar. Dorene Grebe, MD    GSD/MEDQ  D:  12/09/2015  T:  12/10/2015  Job:  [id number]

## 2015-12-10 NOTE — Telephone Encounter (Signed)
Pt. Wife called stating that pt. Is in a lot of pain.  Would like to know if something can be done or if a stronger Rx can be provided?

## 2015-12-10 NOTE — Telephone Encounter (Signed)
Per Dr August Saucerean, IC patient's wife and discussed. Pt has been in sling, taking 1 oxycodone sporadically. CPM to be delivered tomorrow.  Advised take 2 oxycodone q 3 hrs, and take robaxin as well as directed.  The block is wearing off probably causing increase in pain.  She will get him on a better regimen of meds. She will call if they have any more issues, or questions.

## 2015-12-12 ENCOUNTER — Encounter (HOSPITAL_COMMUNITY): Payer: Self-pay | Admitting: Orthopedic Surgery

## 2015-12-15 ENCOUNTER — Telehealth (INDEPENDENT_AMBULATORY_CARE_PROVIDER_SITE_OTHER): Payer: Self-pay | Admitting: Radiology

## 2015-12-15 NOTE — Telephone Encounter (Signed)
FYI  Patient's wife calls and says pt is having a lot of stomach pain and cannot go to bathroom.  He is taking stool softener and has tried a suppository.  I have advised her to have him use a fleet enema, and LOC and if that does not help him to have a BM to go to the ED to seek treatment for possible impaction.  She will have him try the fleet enema and other laxative and call with any further concerns.

## 2015-12-18 ENCOUNTER — Encounter (INDEPENDENT_AMBULATORY_CARE_PROVIDER_SITE_OTHER): Payer: Self-pay | Admitting: Orthopedic Surgery

## 2015-12-18 ENCOUNTER — Ambulatory Visit (INDEPENDENT_AMBULATORY_CARE_PROVIDER_SITE_OTHER): Payer: 59 | Admitting: Orthopedic Surgery

## 2015-12-18 DIAGNOSIS — M75121 Complete rotator cuff tear or rupture of right shoulder, not specified as traumatic: Secondary | ICD-10-CM

## 2015-12-18 DIAGNOSIS — M24112 Other articular cartilage disorders, left shoulder: Secondary | ICD-10-CM

## 2015-12-18 MED ORDER — OXYCODONE HCL 5 MG PO CAPS
5.0000 mg | ORAL_CAPSULE | ORAL | 0 refills | Status: DC | PRN
Start: 2015-12-18 — End: 2016-01-14

## 2015-12-18 NOTE — Progress Notes (Signed)
Office Visit Note   Patient: [name] Kelley           Date of Birth: [birth year]           MRN: [id number] Visit Date: 12/18/2015 Requested by: Elfredia NevinsLawrence Fusco, MD [address] [place], [place] [address] PCP: Cassell SmilesFUSCO,LAWRENCE J., MD  Subjective: Chief Complaint  Patient presents with  . Right Shoulder - Routine Post Op   [name] Kelley is a 30 year old patient postop right shoulder doing well passive range of motion is pretty reasonable still little bit stiff in external rotation is also having left shoulder issues.  Date of injury at work August 30 approximately 2017.  Has a lot of coarseness and grinding weakness positive O'Brien's testing likely has rotator cuff and/or labral pathology on this side.  This failed conservative management plan for MRI scan on that left shoulder.  Patient returns s/p right shoulder scope, SAD, RCT, bicep tendon repair on 12/09/15, 9 days out.  Continue sling, doing ok.  Taking oxycodone and robaxin for pain. CPM is at 60 degrees.  Incisions clean and dry.  Sutures removed.                 Review of Systems all systems reviewed negative as they relate to the chief complaint   Assessment & Plan: Visit Diagnoses:  1. Complete tear of right rotator cuff     Plan: On the right shoulder were going to initiate physical therapy continue CPM machine follow-up in 4 weeks.  On the left shoulder needs MRI arthrogram to evaluate for rotator cuff and labral tear  Follow-Up Instructions: Return in about 4 weeks (around 01/15/2016).   Orders:  No orders of the defined types were placed in this encounter.  No orders of the defined types were placed in this encounter.     Procedures: No procedures performed   Clinical Data: No additional findings.  Objective: Vital Signs: There were no vitals taken for this visit.  Physical Exam  Constitutional: He appears well-developed.  HENT:  Head: Normocephalic.  Eyes: EOM are normal.  Neck: Normal range of motion.    Cardiovascular: Normal rate.   Pulmonary/Chest: Effort normal.  Neurological: He is alert.  Skin: Skin is warm.  Psychiatric: He has a normal mood and affect.    Ortho Exam left shoulder demonstrates course grinding and crepitus positive O'Brien's testing some weakness to suture status testing no resection external rotation at 15 abduction and no before meals joint tenderness direct palpation.  The main finding is he has a lot of coarseness with passive range of motion of the shoulder  Specialty Comments:  No specialty comments available.  Imaging: No results found.   PMFS History: There are no active problems to display for this patient.  Past Medical History:  Diagnosis Date  . Medical history non-contributory     No family history on file.  Past Surgical History:  Procedure Laterality Date  . Arm Manipulation Right 1995?  . INCISION AND DRAINAGE ABSCESS N/A 01/05/2013   Procedure: INCISION AND DRAINAGE PERIANAL  ABSCESS;  Surgeon: Dalia HeadingMark A Jenkins, MD;  Location: [facility];  Service: General;  Laterality: N/A;  . SHOULDER ARTHROSCOPY WITH SUBACROMIAL DECOMPRESSION, ROTATOR CUFF REPAIR AND BICEP TENDON REPAIR Right 12/09/2015   Procedure: SHOULDER DIAGNOSTIC OPERATIVE ARTHROSCOPY, SUBACROMIAL DECOMPRESSION, DISTAL CLAVICLE EXCISION, MINI-OPEN ROTATOR CUFF REPAIR AND BICEP TENODESIS.;  Surgeon: Cammy CopaScott Gregory Dean, MD;  Location: [facility];  Service: Orthopedics;  Laterality: Right;  . widsom teeth  2017   Social  History   Occupational History  . Not on file.   Social History Main Topics  . Smoking status: Never Smoker  . Smokeless tobacco: Never Used  . Alcohol use No  . Drug use: No  . Sexual activity: Not on file

## 2015-12-22 ENCOUNTER — Telehealth (INDEPENDENT_AMBULATORY_CARE_PROVIDER_SITE_OTHER): Payer: Self-pay | Admitting: Orthopedic Surgery

## 2015-12-22 NOTE — Telephone Encounter (Signed)
Patient checking on the status of the attending physician form that was faxed to our office on 12/18/15 to be completed. Pt wants a return call to let him know if we received the form.

## 2015-12-22 NOTE — Telephone Encounter (Signed)
IC pt and advised form filled out, will fax in today for him.  LMVM

## 2015-12-23 NOTE — Telephone Encounter (Signed)
Patient called back wanting to know his work status.  Contact Info: [phone]

## 2015-12-23 NOTE — Telephone Encounter (Signed)
See message below °

## 2015-12-24 NOTE — Telephone Encounter (Signed)
[name] advised per Dr August Saucerean no work x 2-3 months.  Papers sent in no work through approx 03/24/15

## 2015-12-25 ENCOUNTER — Encounter (INDEPENDENT_AMBULATORY_CARE_PROVIDER_SITE_OTHER): Payer: Self-pay | Admitting: *Deleted

## 2016-01-07 ENCOUNTER — Ambulatory Visit (INDEPENDENT_AMBULATORY_CARE_PROVIDER_SITE_OTHER): Payer: 59 | Admitting: Orthopedic Surgery

## 2016-01-07 ENCOUNTER — Ambulatory Visit
Admission: RE | Admit: 2016-01-07 | Discharge: 2016-01-07 | Disposition: A | Discharge status: Home / Self Care | Payer: 59 | Source: Ambulatory Visit | Attending: Orthopedic Surgery | Admitting: Orthopedic Surgery

## 2016-01-07 ENCOUNTER — Encounter (INDEPENDENT_AMBULATORY_CARE_PROVIDER_SITE_OTHER): Payer: Self-pay

## 2016-01-07 DIAGNOSIS — M24112 Other articular cartilage disorders, left shoulder: Secondary | ICD-10-CM

## 2016-01-07 MED ORDER — IOPAMIDOL (ISOVUE-M 200) INJECTION 41%
15.0000 mL | Freq: Once | INTRAMUSCULAR | Status: AC
  Administered 2016-01-07: 15 mL via INTRA_ARTICULAR

## 2016-01-14 ENCOUNTER — Encounter (INDEPENDENT_AMBULATORY_CARE_PROVIDER_SITE_OTHER): Payer: Self-pay | Admitting: Orthopedic Surgery

## 2016-01-14 ENCOUNTER — Ambulatory Visit (INDEPENDENT_AMBULATORY_CARE_PROVIDER_SITE_OTHER): Payer: 59 | Admitting: Orthopedic Surgery

## 2016-01-14 DIAGNOSIS — M75121 Complete rotator cuff tear or rupture of right shoulder, not specified as traumatic: Secondary | ICD-10-CM

## 2016-01-14 DIAGNOSIS — M25512 Pain in left shoulder: Secondary | ICD-10-CM

## 2016-01-14 MED ORDER — OXYCODONE HCL 5 MG PO CAPS: 5.0000 mg | ORAL_CAPSULE | ORAL | 0 refills | Status: DC | PRN

## 2016-01-14 NOTE — Progress Notes (Signed)
   Post-Op Visit Note   Patient: [name] Kelley           Date of Birth: [birth year]           MRN: [id number] Visit Date: 01/14/2016 PCP: Cassell SmilesFUSCO,LAWRENCE J., MD   Assessment & Plan:  Chief Complaint:  Chief Complaint  Patient presents with  . Right Shoulder - Routine Post Op  . Left Shoulder - Pain   Visit Diagnoses:  1. Complete tear of right rotator cuff   2. Acute pain of left shoulder     Plan: [name] Kelley is a 30 year old patient 5 weeks out right shoulders SLAP tear debridement biceps tenodesis and rotator cuff repair.  Also did distal clavicle excision.  He's been using the CPM machine and doing well.  He also injured his left shoulder at work 10/15/2015 when he was twisting a wrench.  Felt a pop and had pretty profound weakness for the first 4 days after that injury at work.  He is had an MRI scan which is reviewed of the left shoulder which does show retracted supraspinatus tear as well as subluxation of the biceps tendon and a partial subscapularis tear.  He's been taking oxycodone for his right shoulder.  On exam he's Improving right shoulder range of motion but still some weakness.  On the left shoulder he's got some weakness to suture status testing in a fair amount of crepitus.  Plan to start strengthening on the right shoulder in 1 week.  On the left shoulder we need to work on surgical scheduling once the right shoulder is more functional.  This would be arthroscopy biceps tendon release mini open supraspinatus and subscap repair.  I'll see him back in about 4 weeks for clinical recheck.  Follow-Up Instructions: Return in about 4 weeks (around 02/11/2016).   Orders:  No orders of the defined types were placed in this encounter.  Meds ordered this encounter  Medications  . oxycodone (OXY-IR) 5 MG capsule    Sig: Take 1 capsule (5 mg total) by mouth every 4 (four) hours as needed.    Dispense:  60 capsule    Refill:  0     PMFS History: There are no active problems to  display for this patient.  Past Medical History:  Diagnosis Date  . Medical history non-contributory     No family history on file.  Past Surgical History:  Procedure Laterality Date  . Arm Manipulation Right 1995?  . INCISION AND DRAINAGE ABSCESS N/A 01/05/2013   Procedure: INCISION AND DRAINAGE PERIANAL  ABSCESS;  Surgeon: Dalia HeadingMark A Jenkins, MD;  Location: [facility];  Service: General;  Laterality: N/A;  . SHOULDER ARTHROSCOPY WITH SUBACROMIAL DECOMPRESSION, ROTATOR CUFF REPAIR AND BICEP TENDON REPAIR Right 12/09/2015   Procedure: SHOULDER DIAGNOSTIC OPERATIVE ARTHROSCOPY, SUBACROMIAL DECOMPRESSION, DISTAL CLAVICLE EXCISION, MINI-OPEN ROTATOR CUFF REPAIR AND BICEP TENODESIS.;  Surgeon: Cammy CopaScott Gregory Dean, MD;  Location: [facility];  Service: Orthopedics;  Laterality: Right;  . widsom teeth  2017   Social History   Occupational History  . Not on file.   Social History Main Topics  . Smoking status: Never Smoker  . Smokeless tobacco: Never Used  . Alcohol use No  . Drug use: No  . Sexual activity: Not on file

## 2016-01-21 ENCOUNTER — Telehealth (INDEPENDENT_AMBULATORY_CARE_PROVIDER_SITE_OTHER): Payer: Self-pay | Admitting: Orthopedic Surgery

## 2016-01-21 NOTE — Telephone Encounter (Signed)
IC pharm and LMVM advised ok for tablets

## 2016-01-21 NOTE — Telephone Encounter (Signed)
Symone from [facility] calling to see if it is ok to give pt tablets instead of capsules as they don't have capsules. Callback# [phone]

## 2016-02-05 ENCOUNTER — Encounter (INDEPENDENT_AMBULATORY_CARE_PROVIDER_SITE_OTHER): Payer: Self-pay | Admitting: Orthopedic Surgery

## 2016-02-05 ENCOUNTER — Ambulatory Visit (INDEPENDENT_AMBULATORY_CARE_PROVIDER_SITE_OTHER): Payer: 59 | Admitting: Orthopedic Surgery

## 2016-02-05 ENCOUNTER — Encounter (INDEPENDENT_AMBULATORY_CARE_PROVIDER_SITE_OTHER): Payer: Self-pay

## 2016-02-05 ENCOUNTER — Other Ambulatory Visit (INDEPENDENT_AMBULATORY_CARE_PROVIDER_SITE_OTHER): Payer: Self-pay | Admitting: Orthopedic Surgery

## 2016-02-05 DIAGNOSIS — M75122 Complete rotator cuff tear or rupture of left shoulder, not specified as traumatic: Secondary | ICD-10-CM

## 2016-02-05 DIAGNOSIS — M75121 Complete rotator cuff tear or rupture of right shoulder, not specified as traumatic: Secondary | ICD-10-CM

## 2016-02-05 MED ORDER — NABUMETONE 500 MG PO TABS: 500.0000 mg | ORAL_TABLET | Freq: Every day | ORAL | 0 refills | Status: DC

## 2016-02-05 NOTE — Progress Notes (Signed)
Post-Op Visit Note   Patient: [name] Kelley           Date of Birth: 1986-01-02           MRN: [id number] Visit Date: 02/05/2016 PCP: Cassell SmilesFUSCO,LAWRENCE J., MD   Assessment & Plan:  Chief Complaint:  Chief Complaint  Patient presents with  . Right Shoulder - Routine Post Op  . Left Shoulder - Pain   Visit Diagnoses:  1. Complete tear of right rotator cuff   2. Complete rotator cuff tear of left shoulder     Plan: [name] Kelley is a 30 year old patient postop right shoulder SLAP tear debridement and mini open rotator cuff tear repair biceps tenodesis distal clavicle excision.  In general is doing well.  Does have a little bit of weakness with forward flexion.  On the left-hand side he has a rotator cuff tear which is larger as well as subluxation of biceps tendon.  He's in physical therapy.  Taking oxycodone as needed.  I'm going to add low-dose Relafen to that regimen.  On the right-hand side he's got a good active and passive range of motion with improving rotator cuff strength.  On the left-hand side there is some coarse grinding and crepitus with the positive O'Brien's testing.  No restriction of external rotation at 15 abduction.  Plan at this time is that he is doing well from his right shoulder.  I think it would be functional enough with that to return to work sometime in early February.  His left shoulder sustained an injury at work as well.  He has had rotator cuff tear.  This will need to be fixed sooner rather than later just to prevent further retraction.  Would like to consider doing that sometime in late January or early February.  From that surgery he would likely be out of work about an additional 3-4 months.  I'll see him back after his surgeries been scheduled.  I do want him to continue physical therapy for that right shoulder.  Follow-Up Instructions: No Follow-up on file.   Orders:  No orders of the defined types were placed in this encounter.  Meds ordered this encounter    Medications  . nabumetone (RELAFEN) 500 MG tablet    Sig: Take 1 tablet (500 mg total) by mouth daily.    Dispense:  30 tablet    Refill:  0     PMFS History: Patient Active Problem List   Diagnosis Date Noted  . Complete tear of right rotator cuff 02/05/2016  . Complete rotator cuff tear of left shoulder 02/05/2016   Past Medical History:  Diagnosis Date  . Medical history non-contributory     No family history on file.  Past Surgical History:  Procedure Laterality Date  . Arm Manipulation Right 1995?  . INCISION AND DRAINAGE ABSCESS N/A 01/05/2013   Procedure: INCISION AND DRAINAGE PERIANAL  ABSCESS;  Surgeon: Dalia HeadingMark A Jenkins, MD;  Location: [facility];  Service: General;  Laterality: N/A;  . SHOULDER ARTHROSCOPY WITH SUBACROMIAL DECOMPRESSION, ROTATOR CUFF REPAIR AND BICEP TENDON REPAIR Right 12/09/2015   Procedure: SHOULDER DIAGNOSTIC OPERATIVE ARTHROSCOPY, SUBACROMIAL DECOMPRESSION, DISTAL CLAVICLE EXCISION, MINI-OPEN ROTATOR CUFF REPAIR AND BICEP TENODESIS.;  Surgeon: Cammy CopaScott Gregory Dean, MD;  Location: [facility];  Service: Orthopedics;  Laterality: Right;  . widsom teeth  2017   Social History   Occupational History  . Not on file.   Social History Main Topics  . Smoking status: Never Smoker  . Smokeless tobacco: Never  Used  . Alcohol use No  . Drug use: No  . Sexual activity: Not on file

## 2016-02-13 ENCOUNTER — Telehealth (INDEPENDENT_AMBULATORY_CARE_PROVIDER_SITE_OTHER): Payer: Self-pay | Admitting: Orthopedic Surgery

## 2016-02-13 NOTE — Telephone Encounter (Signed)
No.  He's 2 months from surgery.  He can have tramadol #30

## 2016-02-13 NOTE — Telephone Encounter (Signed)
Please advise since [name] Kelley out of office

## 2016-02-13 NOTE — Telephone Encounter (Signed)
Rx called in for patient  He is aware

## 2016-02-13 NOTE — Telephone Encounter (Signed)
Pt needs refill of 5mg  oxycodone, he requested a call back at [phone]

## 2016-03-05 ENCOUNTER — Telehealth (INDEPENDENT_AMBULATORY_CARE_PROVIDER_SITE_OTHER): Payer: Self-pay | Admitting: Orthopedic Surgery

## 2016-03-05 NOTE — Telephone Encounter (Signed)
Patient request a call regarding a attending Physician Statement that he dropped off about a month ago, that the insurance company has no received.

## 2016-03-05 NOTE — Telephone Encounter (Signed)
IC pt and discussed, he says the need the left shoulder info separately, and I have asked him to call them to have them refax a form to me- He will call and tell them I faxed info 02/18/16 to them and will have them resend if needed.

## 2016-03-11 ENCOUNTER — Telehealth (INDEPENDENT_AMBULATORY_CARE_PROVIDER_SITE_OTHER): Payer: Self-pay | Admitting: Orthopedic Surgery

## 2016-03-11 NOTE — Telephone Encounter (Signed)
[facility] called looking for signed order  Fax #: [phone] attention liz.

## 2016-03-15 NOTE — Telephone Encounter (Signed)
IC and LMVM and asked them to refax them.  I cannot find them.  [phone]

## 2016-03-30 ENCOUNTER — Ambulatory Visit (HOSPITAL_COMMUNITY): Admission: RE | Admit: 2016-03-30 | Payer: 59 | Source: Ambulatory Visit | Admitting: Orthopedic Surgery

## 2016-03-30 ENCOUNTER — Encounter (HOSPITAL_COMMUNITY): Admission: RE | Payer: Self-pay | Source: Ambulatory Visit

## 2016-03-30 SURGERY — SHOULDER ARTHROSCOPY WITH SUBACROMIAL DECOMPRESSION, ROTATOR CUFF REPAIR AND BICEP TENDON REPAIR
Anesthesia: General | Site: Shoulder | Laterality: Left

## 2016-04-05 ENCOUNTER — Inpatient Hospital Stay (INDEPENDENT_AMBULATORY_CARE_PROVIDER_SITE_OTHER): Payer: 59 | Admitting: Orthopedic Surgery

## 2016-08-02 ENCOUNTER — Telehealth (INDEPENDENT_AMBULATORY_CARE_PROVIDER_SITE_OTHER): Payer: Self-pay | Admitting: Orthopedic Surgery

## 2016-08-02 NOTE — Telephone Encounter (Signed)
RECORDS 01/16/2016 TO PRESENT FAXED [employer]

## 2016-08-03 ENCOUNTER — Encounter (INDEPENDENT_AMBULATORY_CARE_PROVIDER_SITE_OTHER): Payer: Self-pay | Admitting: Orthopedic Surgery

## 2016-08-03 ENCOUNTER — Other Ambulatory Visit (INDEPENDENT_AMBULATORY_CARE_PROVIDER_SITE_OTHER): Payer: Self-pay | Admitting: Orthopedic Surgery

## 2016-08-03 DIAGNOSIS — M75121 Complete rotator cuff tear or rupture of right shoulder, not specified as traumatic: Secondary | ICD-10-CM

## 2016-08-09 ENCOUNTER — Telehealth (INDEPENDENT_AMBULATORY_CARE_PROVIDER_SITE_OTHER): Payer: Self-pay | Admitting: Radiology

## 2016-08-09 NOTE — Telephone Encounter (Signed)
Do you have a current FMLA form for this patient?  If so, can you see what the status of completion is?  Patient's wife is asking for him. He is scheduled for surgery. Thanks.

## 2016-08-10 NOTE — Telephone Encounter (Signed)
I can still use that auth until Sept. so that's fine, but I just called his wife's # about the payment and she didn't answer either. I left a vm for her too.

## 2016-08-10 NOTE — Telephone Encounter (Signed)
There is a signed auth in the chart, the most recent in the media tab.  Is that not the auth?  Maybe we need to call wife to tell her instead about the payment.  Can you check and see if that is the auth, and call wife to advise on all?

## 2016-08-10 NOTE — Telephone Encounter (Signed)
I have a form from the [employer] for him. I had called him on 08/02/16 and told him the process of filling out forms. I emailed the authorization he needed to sign, but I haven't received it nor payment yet. I called him this morning and left him a voicemail.

## 2016-08-25 ENCOUNTER — Telehealth (INDEPENDENT_AMBULATORY_CARE_PROVIDER_SITE_OTHER): Payer: Self-pay | Admitting: Orthopedic Surgery

## 2016-08-25 NOTE — Telephone Encounter (Signed)
Clydie BraunKaren (Nurse case Production designer, theatre/television/filmmanager) with [employer] called needing clarification on the Muscular Skeletal report that was received 08/17/16. Clydie BraunKaren advised it states on the form that the patient has no restrictions but, the patient is scheduled for surgery in August. The number to contact Clydie BraunKaren is [phone]

## 2016-08-25 NOTE — Telephone Encounter (Signed)
Did you fill this out for him for his insurance? Or was it from somewhere else?

## 2016-08-26 NOTE — Telephone Encounter (Signed)
Ok thanks 

## 2016-08-26 NOTE — Telephone Encounter (Signed)
I did fill it out for this insurance co. There aren't any specific restrictions in his records to put on the form. It has on there that he should remain out of work until surgery and then afterwards for recovery. I called her, but had to leave a vm.

## 2016-09-22 ENCOUNTER — Encounter (INDEPENDENT_AMBULATORY_CARE_PROVIDER_SITE_OTHER): Payer: Self-pay | Admitting: Orthopedic Surgery

## 2016-10-20 ENCOUNTER — Inpatient Hospital Stay (INDEPENDENT_AMBULATORY_CARE_PROVIDER_SITE_OTHER): Payer: 59 | Admitting: Orthopedic Surgery

## 2016-11-01 ENCOUNTER — Encounter (HOSPITAL_COMMUNITY)
Admission: RE | Admit: 2016-11-01 | Discharge: 2016-11-01 | Disposition: A | Discharge status: Home / Self Care | Payer: 59 | Source: Ambulatory Visit | Attending: Orthopedic Surgery | Admitting: Orthopedic Surgery

## 2016-11-01 ENCOUNTER — Encounter (HOSPITAL_COMMUNITY): Payer: Self-pay

## 2016-11-01 DIAGNOSIS — Z6835 Body mass index (BMI) 35.0-35.9, adult: Secondary | ICD-10-CM | POA: Diagnosis not present

## 2016-11-01 DIAGNOSIS — M75102 Unspecified rotator cuff tear or rupture of left shoulder, not specified as traumatic: Secondary | ICD-10-CM | POA: Diagnosis present

## 2016-11-01 DIAGNOSIS — Z885 Allergy status to narcotic agent status: Secondary | ICD-10-CM | POA: Diagnosis not present

## 2016-11-01 DIAGNOSIS — M24412 Recurrent dislocation, left shoulder: Secondary | ICD-10-CM | POA: Diagnosis not present

## 2016-11-01 DIAGNOSIS — M75122 Complete rotator cuff tear or rupture of left shoulder, not specified as traumatic: Secondary | ICD-10-CM | POA: Diagnosis not present

## 2016-11-01 LAB — CBC
HEMATOCRIT: 44.8 % (ref 39.0–52.0)
HEMOGLOBIN: 15.2 g/dL (ref 13.0–17.0)
MCH: 29.5 pg (ref 26.0–34.0)
MCHC: 33.9 g/dL (ref 30.0–36.0)
MCV: 87 fL (ref 78.0–100.0)
Platelets: 134 10*3/uL — ABNORMAL LOW (ref 150–400)
RBC: 5.15 MIL/uL (ref 4.22–5.81)
RDW: 13.3 % (ref 11.5–15.5)
WBC: 6 10*3/uL (ref 4.0–10.5)

## 2016-11-01 NOTE — H&P (Signed)
[name] Kelley is an 31 y.o. male.   Chief Complaint: Left shoulder pain HPI: [name] is a 31 year old patient with left shoulder pain.  Sustained an injury last August.  Has had pain in the shoulder since that time.  MRI scan shows supraspinatus tendon tear with retraction along with dislocation of the biceps tendon and upper subscapularis tendon tearing.  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  He localizes most of his pain to the anterior portion of the shoulder with weakness.  He doesn't localize much of his symptoms to the acromioclavicular joint.  No family history or personal history of DVT or pulmonary embolism  Past Medical History:  Diagnosis Date  . Medical history non-contributory     Past Surgical History:  Procedure Laterality Date  . Arm Manipulation Right 1995?  . INCISION AND DRAINAGE ABSCESS N/A 01/05/2013   Procedure: INCISION AND DRAINAGE PERIANAL  ABSCESS;  Surgeon: Dalia Heading, MD;  Location: [facility];  Service: General;  Laterality: N/A;  . SHOULDER ARTHROSCOPY WITH SUBACROMIAL DECOMPRESSION, ROTATOR CUFF REPAIR AND BICEP TENDON REPAIR Right 12/09/2015   Procedure: SHOULDER DIAGNOSTIC OPERATIVE ARTHROSCOPY, SUBACROMIAL DECOMPRESSION, DISTAL CLAVICLE EXCISION, MINI-OPEN ROTATOR CUFF REPAIR AND BICEP TENODESIS.;  Surgeon: Cammy Copa, MD;  Location: [name];  Service: Orthopedics;  Laterality: Right;  . widsom teeth  2017    No family history on file. Social History:  reports that he has never smoked. He has never used smokeless tobacco. He reports that he does not drink alcohol or use drugs.  Allergies:  Allergies  Allergen Reactions  . Hydrocodone Swelling    Tongue swelling    No prescriptions prior to admission.    Results for orders placed or performed during the hospital encounter of 11/01/16 (from the past 48 hour(s))  CBC     Status: Abnormal   Collection Time: 11/01/16  3:45 PM  Result Value Ref Range   WBC 6.0 4.0 -  10.5 K/uL   RBC 5.15 4.22 - 5.81 MIL/uL   Hemoglobin 15.2 13.0 - 17.0 g/dL   HCT 27.2 53.6 - 64.4 %   MCV 87.0 78.0 - 100.0 fL   MCH 29.5 26.0 - 34.0 pg   MCHC 33.9 30.0 - 36.0 g/dL   RDW 03.4 74.2 - 59.5 %   Platelets 134 (L) 150 - 400 K/uL   No results found.  Review of Systems  Musculoskeletal: Positive for joint pain.  All other systems reviewed and are negative.   There were no vitals taken for this visit. Physical Exam  Constitutional: He appears well-developed.  HENT:  Head: Normocephalic.  Eyes: Pupils are equal, round, and reactive to light.  Neck: Normal range of motion.  Cardiovascular: Normal rate.   Respiratory: Effort normal.  Neurological: He is alert.  Skin: Skin is warm.  Psychiatric: He has a normal mood and affect.   examination the left shoulder demonstrates reasonable passive range of motion with no discrete acromioclavicular joint tenderness.  Crepitus is present to passive range of motion.  Supraspinatus weakness is present to isolated muscle testing.  Not much in way of subscapularis weakness and there is no Popeye deformity present.  Infraspinatus testing is pretty good strength wise.  No other masses lymph adenopathy or skin changes noted in the left shoulder region  Assessment/Plan Impression is left shoulder rotator cuff tear.  Plan is left shoulder arthroscopy biceps tendon release and labral debridement mini open supraspinatus repair and subscapularis upper portion repair with biceps tenodesis.  Do not anticipate doing anything with the acromioclavicular joint particularly in light of his os acromiale.  All questions answered.  A decompression may be performed but I do not want to take out the acromioclavicular joint.  Anticipate using CPM machine postoperatively.  All questions answered.  Burnard Bunting, MD 11/01/2016, 10:40 PM

## 2016-11-01 NOTE — Pre-Procedure Instructions (Signed)
[facility]  11/01/2016      [facility] - [place], [place] - [address] AT [address] [place] [address] Phone: [phone] Fax: [phone]    Your procedure is scheduled on November 02, 2016.  Report to [facility] Admitting at 145 PM.  Call this number if you have problems the morning of surgery:  [phone]   Remember:  Do not eat food or drink liquids after midnight.  Take these medicines the morning of surgery with A SIP OF WATER (none)  7 days prior to surgery STOP taking any Aspirin, Aleve, Naproxen, Ibuprofen, Motrin, Advil, Goody's, BC's, all herbal medications, fish oil, and all vitamins   Do not wear jewelry, make-up or nail polish.  Do not wear lotions, powders, or perfumes, or deoderant.  Do not shave 48 hours prior to surgery.  Men may shave face and neck.  Do not bring valuables to the hospital.  [facility] is not responsible for any belongings or valuables.  Contacts, dentures or bridgework may not be worn into surgery.  Leave your suitcase in the car.  After surgery it may be brought to your room.  For patients admitted to the hospital, discharge time will be determined by your treatment team.  Patients discharged the day of surgery will not be allowed to drive home.   Special instructions:  [place]- Preparing For Surgery  Before surgery, you can play an important role. Because skin is not sterile, your skin needs to be as free of germs as possible. You can reduce the number of germs on your skin by washing with CHG (chlorahexidine gluconate) Soap before surgery.  CHG is an antiseptic cleaner which kills germs and bonds with the skin to continue killing germs even after washing.  Please do not use if you have an allergy to CHG or antibacterial soaps. If your skin becomes reddened/irritated stop using the CHG.  Do not shave (including legs and underarms) for at least 48  hours prior to first CHG shower. It is OK to shave your face.  Please follow these instructions carefully.   1. Shower the NIGHT BEFORE SURGERY and the MORNING OF SURGERY with CHG.   2. If you chose to wash your hair, wash your hair first as usual with your normal shampoo.  3. After you shampoo, rinse your hair and body thoroughly to remove the shampoo.  4. Use CHG as you would any other liquid soap. You can apply CHG directly to the skin and wash gently with a scrungie or a clean washcloth.   5. Apply the CHG Soap to your body ONLY FROM THE NECK DOWN.  Do not use on open wounds or open sores. Avoid contact with your eyes, ears, mouth and genitals (private parts). Wash genitals (private parts) with your normal soap.  6. Wash thoroughly, paying special attention to the area where your surgery will be performed.  7. Thoroughly rinse your body with warm water from the neck down.  8. DO NOT shower/wash with your normal soap after using and rinsing off the CHG Soap.  9. Pat yourself dry with a CLEAN TOWEL.   10. Wear CLEAN PAJAMAS   11. Place CLEAN SHEETS on your bed the night of your first shower and DO NOT SLEEP WITH PETS.    Day of Surgery: Do not apply any deodorants/lotions. Please wear clean clothes to the hospital/surgery  center.     Please read over the following fact sheets that you were given. Pain Booklet, Coughing and Deep Breathing, MRSA Information and Surgical Site Infection Prevention

## 2016-11-01 NOTE — Progress Notes (Addendum)
[id number], Lyman Bishop, MD  Cardiologist: pt denies  EKG: pt denies, on no medication  Stress test: pt denies ever  ECHO: pt denies ever  Cardiac Cath: pt denies ever  Chest x-ray: pt denies past year, no recent respiratory complications/infections

## 2016-11-02 ENCOUNTER — Encounter (HOSPITAL_COMMUNITY)
Admission: RE | Disposition: A | Discharge status: Home / Self Care | Payer: Self-pay | Source: Ambulatory Visit | Attending: Orthopedic Surgery

## 2016-11-02 ENCOUNTER — Ambulatory Visit (HOSPITAL_COMMUNITY)
Admission: RE | Admit: 2016-11-02 | Discharge: 2016-11-02 | Disposition: A | Discharge status: Home / Self Care | Payer: 59 | Source: Ambulatory Visit | Attending: Orthopedic Surgery | Admitting: Orthopedic Surgery

## 2016-11-02 ENCOUNTER — Ambulatory Visit (HOSPITAL_COMMUNITY): Payer: 59 | Admitting: Anesthesiology

## 2016-11-02 ENCOUNTER — Encounter (HOSPITAL_COMMUNITY): Payer: Self-pay | Admitting: Urology

## 2016-11-02 DIAGNOSIS — M24412 Recurrent dislocation, left shoulder: Secondary | ICD-10-CM | POA: Insufficient documentation

## 2016-11-02 DIAGNOSIS — M75102 Unspecified rotator cuff tear or rupture of left shoulder, not specified as traumatic: Secondary | ICD-10-CM | POA: Diagnosis not present

## 2016-11-02 DIAGNOSIS — M75122 Complete rotator cuff tear or rupture of left shoulder, not specified as traumatic: Secondary | ICD-10-CM | POA: Insufficient documentation

## 2016-11-02 DIAGNOSIS — Z885 Allergy status to narcotic agent status: Secondary | ICD-10-CM | POA: Diagnosis not present

## 2016-11-02 DIAGNOSIS — Z6835 Body mass index (BMI) 35.0-35.9, adult: Secondary | ICD-10-CM | POA: Diagnosis not present

## 2016-11-02 DIAGNOSIS — S46112D Strain of muscle, fascia and tendon of long head of biceps, left arm, subsequent encounter: Secondary | ICD-10-CM | POA: Diagnosis not present

## 2016-11-02 DIAGNOSIS — S43082A Other subluxation of left shoulder joint, initial encounter: Secondary | ICD-10-CM | POA: Diagnosis not present

## 2016-11-02 DIAGNOSIS — M75121 Complete rotator cuff tear or rupture of right shoulder, not specified as traumatic: Secondary | ICD-10-CM

## 2016-11-02 DIAGNOSIS — G8918 Other acute postprocedural pain: Secondary | ICD-10-CM | POA: Diagnosis not present

## 2016-11-02 DIAGNOSIS — M19012 Primary osteoarthritis, left shoulder: Secondary | ICD-10-CM | POA: Diagnosis not present

## 2016-11-02 HISTORY — PX: SHOULDER ARTHROSCOPY WITH OPEN ROTATOR CUFF REPAIR AND DISTAL CLAVICLE ACROMINECTOMY: SHX5683

## 2016-11-02 SURGERY — SHOULDER ARTHROSCOPY WITH OPEN ROTATOR CUFF REPAIR AND DISTAL CLAVICLE ACROMINECTOMY
Anesthesia: General | Site: Arm Upper | Laterality: Left

## 2016-11-02 MED ORDER — MEPERIDINE HCL 25 MG/ML IJ SOLN: 6.2500 mg | INTRAMUSCULAR | Status: DC | PRN

## 2016-11-02 MED ORDER — CEFAZOLIN SODIUM-DEXTROSE 2-4 GM/100ML-% IV SOLN
INTRAVENOUS | Status: AC
  Filled 2016-11-02: qty 100

## 2016-11-02 MED ORDER — PROPOFOL 10 MG/ML IV BOLUS
INTRAVENOUS | Status: AC
  Filled 2016-11-02: qty 20

## 2016-11-02 MED ORDER — CEFAZOLIN SODIUM-DEXTROSE 2-4 GM/100ML-% IV SOLN
2.0000 g | INTRAVENOUS | Status: AC
  Administered 2016-11-02: 2 g via INTRAVENOUS

## 2016-11-02 MED ORDER — FENTANYL CITRATE (PF) 250 MCG/5ML IJ SOLN
INTRAMUSCULAR | Status: AC
  Filled 2016-11-02: qty 5

## 2016-11-02 MED ORDER — FENTANYL CITRATE (PF) 250 MCG/5ML IJ SOLN
INTRAMUSCULAR | Status: DC | PRN
  Administered 2016-11-02 (×2): 50 ug via INTRAVENOUS
  Administered 2016-11-02: 100 ug via INTRAVENOUS
  Administered 2016-11-02: 50 ug via INTRAVENOUS

## 2016-11-02 MED ORDER — FENTANYL CITRATE (PF) 100 MCG/2ML IJ SOLN
100.0000 ug | Freq: Once | INTRAMUSCULAR | Status: AC
  Administered 2016-11-02: 100 ug via INTRAVENOUS

## 2016-11-02 MED ORDER — MIDAZOLAM HCL 2 MG/2ML IJ SOLN
INTRAMUSCULAR | Status: DC | PRN
  Administered 2016-11-02: 2 mg via INTRAVENOUS

## 2016-11-02 MED ORDER — CHLORHEXIDINE GLUCONATE 4 % EX LIQD: 60.0000 mL | Freq: Once | CUTANEOUS | Status: DC

## 2016-11-02 MED ORDER — MIDAZOLAM HCL 2 MG/2ML IJ SOLN
2.0000 mg | Freq: Once | INTRAMUSCULAR | Status: AC
  Administered 2016-11-02: 2 mg via INTRAVENOUS

## 2016-11-02 MED ORDER — ONDANSETRON HCL 4 MG/2ML IJ SOLN
INTRAMUSCULAR | Status: AC
  Filled 2016-11-02: qty 2

## 2016-11-02 MED ORDER — HYDROMORPHONE HCL 1 MG/ML IJ SOLN: 0.2500 mg | INTRAMUSCULAR | Status: DC | PRN

## 2016-11-02 MED ORDER — PROPOFOL 10 MG/ML IV BOLUS
INTRAVENOUS | Status: DC | PRN
  Administered 2016-11-02: 200 mg via INTRAVENOUS

## 2016-11-02 MED ORDER — BUPIVACAINE-EPINEPHRINE (PF) 0.5% -1:200000 IJ SOLN
INTRAMUSCULAR | Status: DC | PRN
  Administered 2016-11-02: 30 mL via PERINEURAL

## 2016-11-02 MED ORDER — EPINEPHRINE PF 1 MG/ML IJ SOLN
INTRAMUSCULAR | Status: DC | PRN
  Administered 2016-11-02: .1 mL

## 2016-11-02 MED ORDER — CLONIDINE HCL (ANALGESIA) 100 MCG/ML EP SOLN
150.0000 ug | Freq: Once | EPIDURAL | Status: DC
  Filled 2016-11-02: qty 1.5

## 2016-11-02 MED ORDER — OXYCODONE HCL 5 MG PO TABS: 5.0000 mg | ORAL_TABLET | ORAL | 0 refills | Status: DC | PRN

## 2016-11-02 MED ORDER — LIDOCAINE 2% (20 MG/ML) 5 ML SYRINGE
INTRAMUSCULAR | Status: DC | PRN
  Administered 2016-11-02: 60 mg via INTRAVENOUS

## 2016-11-02 MED ORDER — PHENYLEPHRINE HCL 10 MG/ML IJ SOLN
INTRAMUSCULAR | Status: DC | PRN
  Administered 2016-11-02: 20 ug/min via INTRAVENOUS

## 2016-11-02 MED ORDER — FENTANYL CITRATE (PF) 100 MCG/2ML IJ SOLN
INTRAMUSCULAR | Status: AC
  Administered 2016-11-02: 100 ug via INTRAVENOUS
  Filled 2016-11-02: qty 2

## 2016-11-02 MED ORDER — SUGAMMADEX SODIUM 200 MG/2ML IV SOLN
INTRAVENOUS | Status: DC | PRN
  Administered 2016-11-02: 300 mg via INTRAVENOUS

## 2016-11-02 MED ORDER — LACTATED RINGERS IV SOLN
INTRAVENOUS | Status: DC
  Administered 2016-11-02 (×2): via INTRAVENOUS

## 2016-11-02 MED ORDER — SODIUM CHLORIDE 0.9 % IJ SOLN
INTRAMUSCULAR | Status: DC | PRN
  Administered 2016-11-02: 20 mL via INTRAVENOUS

## 2016-11-02 MED ORDER — SODIUM CHLORIDE 0.9 % IR SOLN
Status: DC | PRN
  Administered 2016-11-02: 3000 mL

## 2016-11-02 MED ORDER — DEXAMETHASONE SODIUM PHOSPHATE 10 MG/ML IJ SOLN
INTRAMUSCULAR | Status: AC
  Filled 2016-11-02: qty 1

## 2016-11-02 MED ORDER — MIDAZOLAM HCL 2 MG/2ML IJ SOLN
INTRAMUSCULAR | Status: AC
  Administered 2016-11-02: 2 mg via INTRAVENOUS
  Filled 2016-11-02: qty 2

## 2016-11-02 MED ORDER — MIDAZOLAM HCL 2 MG/2ML IJ SOLN
INTRAMUSCULAR | Status: AC
  Filled 2016-11-02: qty 2

## 2016-11-02 MED ORDER — DEXAMETHASONE SODIUM PHOSPHATE 10 MG/ML IJ SOLN
INTRAMUSCULAR | Status: DC | PRN
  Administered 2016-11-02: 10 mg via INTRAVENOUS

## 2016-11-02 MED ORDER — ONDANSETRON HCL 4 MG/2ML IJ SOLN
INTRAMUSCULAR | Status: DC | PRN
  Administered 2016-11-02: 4 mg via INTRAVENOUS

## 2016-11-02 MED ORDER — PROMETHAZINE HCL 25 MG/ML IJ SOLN: 6.2500 mg | INTRAMUSCULAR | Status: DC | PRN

## 2016-11-02 MED ORDER — ROCURONIUM BROMIDE 10 MG/ML (PF) SYRINGE
PREFILLED_SYRINGE | INTRAVENOUS | Status: DC | PRN
  Administered 2016-11-02: 50 mg via INTRAVENOUS
  Administered 2016-11-02: 20 mg via INTRAVENOUS

## 2016-11-02 MED ORDER — EPINEPHRINE PF 1 MG/ML IJ SOLN
INTRAMUSCULAR | Status: AC
  Filled 2016-11-02: qty 1

## 2016-11-02 MED ORDER — SUGAMMADEX SODIUM 500 MG/5ML IV SOLN
INTRAVENOUS | Status: AC
  Filled 2016-11-02: qty 5

## 2016-11-02 MED ORDER — METHOCARBAMOL 500 MG PO TABS: 500.0000 mg | ORAL_TABLET | Freq: Three times a day (TID) | ORAL | 0 refills | Status: DC | PRN

## 2016-11-02 MED ORDER — MIDAZOLAM HCL 2 MG/2ML IJ SOLN: 0.5000 mg | Freq: Once | INTRAMUSCULAR | Status: DC | PRN

## 2016-11-02 MED FILL — oxyCODONE HCL 5 MG TABS: 5 | 10 days supply | Qty: 60 | Fill #0

## 2016-11-02 SURGICAL SUPPLY — 78 items
AID PSTN UNV HD RSTRNT DISP (MISCELLANEOUS) ×1
ANCH SUT CRKSW FT 1.3X (Anchor) ×1 IMPLANT
ANCH SUT PUSHLCK 24X4.5 STRL (Orthopedic Implant) ×1 IMPLANT
ANCH SUT SWLK 19.1X6.25 CLS (Anchor) ×1 IMPLANT
ANCHOR SUT BIOCOMP CORKSREW (Anchor) ×2 IMPLANT
ANCHOR SUT SWIVELLOK BIO (Anchor) ×2 IMPLANT
BLADE CUTTER GATOR 3.5 (BLADE) ×3 IMPLANT
BLADE GREAT WHITE 4.2 (BLADE) ×1 IMPLANT
BLADE GREAT WHITE 4.2MM (BLADE) ×1
BLADE SURG 11 STRL SS (BLADE) ×3 IMPLANT
BUR OVAL 6.0 (BURR) IMPLANT
CLOSURE WOUND 1/2 X4 (GAUZE/BANDAGES/DRESSINGS) ×1
COVER SURGICAL LIGHT HANDLE (MISCELLANEOUS) ×3 IMPLANT
DRAPE INCISE IOBAN 66X45 STRL (DRAPES) ×6 IMPLANT
DRAPE STERI 35X30 U-POUCH (DRAPES) ×3 IMPLANT
DRAPE U-SHAPE 47X51 STRL (DRAPES) ×6 IMPLANT
DRSG PAD ABDOMINAL 8X10 ST (GAUZE/BANDAGES/DRESSINGS) ×9 IMPLANT
DRSG TEGADERM 4X4.75 (GAUZE/BANDAGES/DRESSINGS) ×6 IMPLANT
DURAPREP 26ML APPLICATOR (WOUND CARE) ×3 IMPLANT
ELECT REM PT RETURN 9FT ADLT (ELECTROSURGICAL) ×3
ELECTRODE REM PT RTRN 9FT ADLT (ELECTROSURGICAL) ×1 IMPLANT
FIBERLOOP 2 0 (SUTURE) ×3 IMPLANT
FILTER STRAW FLUID ASPIR (MISCELLANEOUS) ×3 IMPLANT
GAUZE SPONGE 4X4 16PLY XRAY LF (GAUZE/BANDAGES/DRESSINGS) ×2 IMPLANT
GAUZE XEROFORM 1X8 LF (GAUZE/BANDAGES/DRESSINGS) ×3 IMPLANT
GLOVE BIOGEL PI IND STRL 8 (GLOVE) ×1 IMPLANT
GLOVE BIOGEL PI INDICATOR 8 (GLOVE) ×2
GLOVE SURG ORTHO 8.0 STRL STRW (GLOVE) ×3 IMPLANT
GOWN STRL REUS W/ TWL LRG LVL3 (GOWN DISPOSABLE) ×3 IMPLANT
GOWN STRL REUS W/TWL LRG LVL3 (GOWN DISPOSABLE) ×9
IMMOBILIZER SHOULDER FOAM XLGE (SOFTGOODS) ×2 IMPLANT
KIT BASIN OR (CUSTOM PROCEDURE TRAY) ×3 IMPLANT
KIT ROOM TURNOVER OR (KITS) ×3 IMPLANT
MANIFOLD NEPTUNE II (INSTRUMENTS) ×3 IMPLANT
NDL HYPO 25X1 1.5 SAFETY (NEEDLE) ×1 IMPLANT
NDL SCORPION MULTI FIRE (NEEDLE) IMPLANT
NDL SUT 6 .5 CRC .975X.05 MAYO (NEEDLE) IMPLANT
NEEDLE HYPO 25X1 1.5 SAFETY (NEEDLE) ×3 IMPLANT
NEEDLE MAYO TAPER (NEEDLE)
NEEDLE SCORPION MULTI FIRE (NEEDLE) ×3 IMPLANT
NEEDLE SPNL 18GX3.5 QUINCKE PK (NEEDLE) ×3 IMPLANT
NS IRRIG 1000ML POUR BTL (IV SOLUTION) ×3 IMPLANT
PACK SHOULDER (CUSTOM PROCEDURE TRAY) ×3 IMPLANT
PAD ABD 8X10 STRL (GAUZE/BANDAGES/DRESSINGS) ×2 IMPLANT
PAD ARMBOARD 7.5X6 YLW CONV (MISCELLANEOUS) ×6 IMPLANT
PUSHLOCK PEEK 4.5X24 (Orthopedic Implant) ×2 IMPLANT
RESTRAINT HEAD UNIVERSAL NS (MISCELLANEOUS) ×3 IMPLANT
SET ARTHROSCOPY TUBING (MISCELLANEOUS) ×3
SET ARTHROSCOPY TUBING LN (MISCELLANEOUS) ×1 IMPLANT
SLING ARM IMMOBILIZER LRG (SOFTGOODS) ×3 IMPLANT
SLING ARM IMMOBILIZER MED (SOFTGOODS) IMPLANT
SPONGE GAUZE 4X4 12PLY STER LF (GAUZE/BANDAGES/DRESSINGS) ×3 IMPLANT
SPONGE LAP 4X18 X RAY DECT (DISPOSABLE) ×6 IMPLANT
STRIP CLOSURE SKIN 1/2X4 (GAUZE/BANDAGES/DRESSINGS) ×2 IMPLANT
SUCTION FRAZIER HANDLE 10FR (MISCELLANEOUS) ×2
SUCTION TUBE FRAZIER 10FR DISP (MISCELLANEOUS) ×1 IMPLANT
SUT ETHILON 3 0 PS 1 (SUTURE) ×5 IMPLANT
SUT FIBERWIRE #2 38 T-5 BLUE (SUTURE)
SUT MNCRL AB 3-0 PS2 18 (SUTURE) ×5 IMPLANT
SUT VIC AB 0 CT1 27 (SUTURE) ×6
SUT VIC AB 0 CT1 27XBRD ANBCTR (SUTURE) ×1 IMPLANT
SUT VIC AB 1 CT1 27 (SUTURE) ×6
SUT VIC AB 1 CT1 27XBRD ANBCTR (SUTURE) ×1 IMPLANT
SUT VIC AB 2-0 CT1 27 (SUTURE) ×9
SUT VIC AB 2-0 CT1 TAPERPNT 27 (SUTURE) ×1 IMPLANT
SUT VICRYL 0 UR6 27IN ABS (SUTURE) ×10 IMPLANT
SUTURE FIBERWR #2 38 T-5 BLUE (SUTURE) IMPLANT
SUTURE TAPE 1.3 40 TPR END (SUTURE) IMPLANT
SUTURETAPE 1.3 40 TPR END (SUTURE) ×3
SYR 20CC LL (SYRINGE) ×6 IMPLANT
SYR 3ML LL SCALE MARK (SYRINGE) ×3 IMPLANT
SYR TB 1ML LUER SLIP (SYRINGE) ×3 IMPLANT
TAPE STRIPS DRAPE STRL (GAUZE/BANDAGES/DRESSINGS) ×2 IMPLANT
TOWEL OR 17X24 6PK STRL BLUE (TOWEL DISPOSABLE) ×3 IMPLANT
TOWEL OR 17X26 10 PK STRL BLUE (TOWEL DISPOSABLE) ×3 IMPLANT
WAND HAND CNTRL MULTIVAC 90 (MISCELLANEOUS) IMPLANT
WAND STAR VAC 90 (SURGICAL WAND) ×3 IMPLANT
WATER STERILE IRR 1000ML POUR (IV SOLUTION) ×3 IMPLANT

## 2016-11-02 NOTE — Anesthesia Preprocedure Evaluation (Addendum)
Anesthesia Evaluation  Patient identified by MRN, date of birth, ID band Patient awake    Reviewed: Allergy & Precautions, NPO status , Patient's Chart, lab work & pertinent test results  History of Anesthesia Complications Negative for: history of anesthetic complications  Airway Mallampati: II  TM Distance: >3 FB Neck ROM: Full    Dental  (+) Dental Advisory Given   Pulmonary neg pulmonary ROS,    breath sounds clear to auscultation       Cardiovascular negative cardio ROS   Rhythm:Regular Rate:Normal     Neuro/Psych negative neurological ROS     GI/Hepatic negative GI ROS, Neg liver ROS,   Endo/Other  Morbid obesity  Renal/GU negative Renal ROS     Musculoskeletal   Abdominal (+) + obese,   Peds  Hematology negative hematology ROS (+)   Anesthesia Other Findings   Reproductive/Obstetrics                            Anesthesia Physical Anesthesia Plan  ASA: II  Anesthesia Plan: General   Post-op Pain Management: GA combined w/ Regional for post-op pain   Induction: Intravenous  PONV Risk Score and Plan: 2 and Ondansetron and Dexamethasone  Airway Management Planned: Oral ETT  Additional Equipment:   Intra-op Plan:   Post-operative Plan: Extubation in OR  Informed Consent: I have reviewed the patients History and Physical, chart, labs and discussed the procedure including the risks, benefits and alternatives for the proposed anesthesia with the patient or authorized representative who has indicated his/her understanding and acceptance.   Dental advisory given  Plan Discussed with: CRNA and Surgeon  Anesthesia Plan Comments: (Plan routine monitors, GETA with interscalene block for post op analgesia)        Anesthesia Quick Evaluation

## 2016-11-02 NOTE — Anesthesia Procedure Notes (Addendum)
Anesthesia Regional Block: Interscalene brachial plexus block   Pre-Anesthetic Checklist: ,, timeout performed, Correct Patient, Correct Site, Correct Laterality, Correct Procedure, Correct Position, site marked, Risks and benefits discussed,  Surgical consent,  Pre-op evaluation,  At surgeon's request and post-op pain management  Laterality: Left and Upper  Prep: chloraprep       Needles:   Needle Type: Echogenic Stimulator Needle     Needle Length: 4cm  Needle Gauge: 21     Additional Needles:   Procedures:, nerve stimulator,,, ultrasound used (permanent image in chart),,,,   Nerve Stimulator or Paresthesia:  Response: forearm twitch, 0.2 mA, 0.1 ms,   Additional Responses:   Narrative:  Start time: 11/02/2016 3:48 PM End time: 11/02/2016 4:03 PM Injection made incrementally with aspirations every 5 mL.  Performed by: Personally  Anesthesiologist: Jean Rosenthal, Monetta Lick  Additional Notes: Pt identified in Holding room.  Monitors applied. Working IV access confirmed. Sterile prep L neck.  #21ga PNS to twitch at 0.45mA threshold with US guidance.  30cc 0.5% Bupivacaine with 1:200k epi injected incrementally after negative test dose.  Patient asymptomatic, VSS, no heme aspirated, tolerated well.  Sandford Craze, MD

## 2016-11-02 NOTE — Interval H&P Note (Signed)
History and Physical Interval Note:  11/02/2016 4:10 PM  [name]  has presented today for surgery, with the diagnosis of LEFT SHOULDER ROTATOR CUFF TEAR, BICEPS SUBLUXATION  The various methods of treatment have been discussed with the patient and family. After consideration of risks, benefits and other options for treatment, the patient has consented to  Procedure(s): LEFT SHOULDER ARTHROSCOPY WITH MINI OPEN ROTATOR CUFF REPAIR, OPEN BICEPS TENODESIS, AND DEBRIDEMENT (Left) as a surgical intervention .  The patient's history has been reviewed, patient examined, no change in status, stable for surgery.  I have reviewed the patient's chart and labs.  Questions were answered to the patient's satisfaction.     Burnard Bunting

## 2016-11-02 NOTE — Transfer of Care (Signed)
Immediate Anesthesia Transfer of Care Note  Patient: [name]  Procedure(s) Performed: Procedure(s): LEFT SHOULDER ARTHROSCOPY WITH MINI OPEN ROTATOR CUFF REPAIR, OPEN BICEPS TENODESIS, AND DEBRIDEMENT (Left)  Patient Location: PACU  Anesthesia Type:GA combined with regional for post-op pain  Level of Consciousness: drowsy  Airway & Oxygen Therapy: Patient Spontanous Breathing and Patient connected to face mask oxygen  Post-op Assessment: Report given to RN and Post -op Vital signs reviewed and stable  Post vital signs: Reviewed and stable  Last Vitals:  Vitals:   11/02/16 1620 11/02/16 1935  BP: 130/63 (P) 115/62  Pulse: 79 (P) 87  Resp: (!) 21 (P) 13  Temp:  (P) 36.4 C  SpO2: 99% (P) 96%    Last Pain:  Vitals:   11/02/16 1404  TempSrc: Oral         Complications: No apparent anesthesia complications

## 2016-11-02 NOTE — Anesthesia Postprocedure Evaluation (Signed)
Anesthesia Post Note  Patient: BENNET KUJAWA  Procedure(s) Performed: Procedure(s) (LRB): LEFT SHOULDER ARTHROSCOPY WITH MINI OPEN ROTATOR CUFF REPAIR, OPEN BICEPS TENODESIS, AND DEBRIDEMENT (Left)     Patient location during evaluation: PACU Anesthesia Type: General Level of consciousness: awake and alert Pain management: pain level controlled Vital Signs Assessment: post-procedure vital signs reviewed and stable Respiratory status: spontaneous breathing, nonlabored ventilation, respiratory function stable and patient connected to nasal cannula oxygen Cardiovascular status: blood pressure returned to baseline and stable Postop Assessment: no apparent nausea or vomiting Anesthetic complications: no    Last Vitals:  Vitals:   11/02/16 2015 11/02/16 2030  BP: 113/68 130/74  Pulse: 81 84  Resp: 14 15  Temp: 36.9 C   SpO2: 97% 93%    Last Pain:  Vitals:   11/02/16 1404  TempSrc: Oral                 Beryle Lathe

## 2016-11-02 NOTE — Brief Op Note (Signed)
11/02/2016  7:06 PM  PATIENT:  Josephine Cables  31 y.o. male  PRE-OPERATIVE DIAGNOSIS:  LEFT SHOULDER ROTATOR CUFF TEAR, BICEPS SUBLUXATION  POST-OPERATIVE DIAGNOSIS:  LEFT SHOULDER ROTATOR CUFF TEAR, BICEPS SUBLUXATION  PROCEDURE:  Procedure(s): LEFT SHOULDER ARTHROSCOPY WITH MINI OPEN ROTATOR CUFF REPAIR, OPEN BICEPS TENODESIS, AND DEBRIDEMENT  SURGEON:  Surgeon(s): August Saucer, Corrie Mckusick, MD  ASSISTANT: Patrick Jupiter rnfa  ANESTHESIA:   general  EBL: 50 ml    No intake/output data recorded.  BLOOD ADMINISTERED: none  DRAINS: none   LOCAL MEDICATIONS USED:  none  SPECIMEN:  No Specimen  COUNTS:  YES  TOURNIQUET:  * No tourniquets in log *  DICTATION: .Other Dictation: Dictation Number [id number]  PLAN OF CARE: Discharge to home after PACU  PATIENT DISPOSITION:  PACU - hemodynamically stable

## 2016-11-02 NOTE — Anesthesia Procedure Notes (Signed)
Procedure Name: Intubation Date/Time: 11/02/2016 5:02 PM Performed by: Julieta Bellini Pre-anesthesia Checklist: Patient identified, Emergency Drugs available, Suction available and Patient being monitored Patient Re-evaluated:Patient Re-evaluated prior to induction Oxygen Delivery Method: Circle system utilized Preoxygenation: Pre-oxygenation with 100% oxygen Induction Type: IV induction Ventilation: Mask ventilation without difficulty Laryngoscope Size: Mac and 4 Grade View: Grade I Tube type: Oral Tube size: 7.5 mm Number of attempts: 1 Airway Equipment and Method: Stylet Placement Confirmation: ETT inserted through vocal cords under direct vision,  positive ETCO2 and breath sounds checked- equal and bilateral Secured at: 22 cm Tube secured with: Tape Dental Injury: Teeth and Oropharynx as per pre-operative assessment

## 2016-11-03 ENCOUNTER — Encounter (HOSPITAL_COMMUNITY): Payer: Self-pay | Admitting: Orthopedic Surgery

## 2016-11-03 DIAGNOSIS — M75121 Complete rotator cuff tear or rupture of right shoulder, not specified as traumatic: Secondary | ICD-10-CM | POA: Diagnosis not present

## 2016-11-03 NOTE — Addendum Note (Signed)
Addendum  created 11/03/16 2009 by Jairo Ben, MD   Anesthesia Intra Blocks edited, Sign clinical note

## 2016-11-03 NOTE — Op Note (Signed)
NAMEFRANCISZEK, PLATTEN                 ACCOUNT NO.:  [id number]  MEDICAL RECORD NO.:  [id number]  LOCATION:                               FACILITY:  [facility]  PHYSICIAN:  Burnard Bunting, MD      DATE OF BIRTH:  [birth year]  DATE OF PROCEDURE: DATE OF DISCHARGE:  11/02/2016                              OPERATIVE REPORT   PREOPERATIVE DIAGNOSIS:  Left shoulder biceps tendon subluxation with subscapularis tear and supraspinatus tear.  POSTOPERATIVE DIAGNOSIS:  Left shoulder biceps tendon subluxation with subscapularis tear, retraction about 1.5 to 2 cm with massive irreparable supraspinatus tear with retraction to the glenoid rim.  PROCEDURES:  Left shoulder arthroscopy, labral debridement, biceps tendon release, mini open rotator cuff repair of the subscapularis with open biceps tenodesis and attempted mobilization of the supraspinatus without success.  SURGEON:  Burnard Bunting, M.D.  ASSISTANT:  Patrick Jupiter, RNFA.  INDICATIONS:  [name] is a 31 year old patient with left shoulder pain. Injured his shoulder a year ago.  MRI at that time showed 3-cm supraspinatus tear.  He has had progressive symptoms.  Presents now for operative management after explanation of risks and benefits.  OPERATIVE FINDINGS: 1. Examination under anesthesia, range of motion, external rotation of     15 degrees with abduction is 45.  The patient had full passive     forward flexion and abduction. 2. Diagnostic and operative arthroscopy.     a.     Subluxation of the biceps tendon.     b.     Tear of the upper one-half of the subscapularis with      retraction on the order of 1.5 to 2 cm.     c.     Intact glenohumeral articular surfaces.     d.     Massive supraspinatus tear with retraction to the glenoid      rim.  PROCEDURE IN DETAIL:  The patient was brought to the operating room where general anesthetic was induced.  Preoperative antibiotics were administered.  Time-out was called.  The patient was  placed in the beach chair position with the head in neutral position.  Left arm was prescrubbed with alcohol and Betadine and allowed to air dry, prepped with DuraPrep solution and draped in a sterile manner.  [name] was used to cover the axilla.  Time-out was called.  Posterior portal was created 2 cm medial and inferior to the posterolateral margin of the acromion. Diagnostic arthroscopy was performed.  Anterior portal created under direct visualization.  Subscapularis was noted to be torn.  Biceps tendon was subluxated.  Debridement was performed of the labrum.  Biceps tendon was released.  Evaluation was then made of the supraspinatus tendon.  Significant atrophy and wasting of the tissue had occurred. There was retraction of the rotator cuff to the glenoid rim. Arthroscopic mobilization was performed both on the superior and inferior surface of the glenoid.  Rotator interval release also performed.  At this time, decision was made to proceed with attempted repair.  Subscapularis definitely needed to be repaired.  [name] was used to cover the operative field.  Incision was made off the anterolateral margin of the  acromion, deltoid split at measured distance of 4 cm from the anterolateral margin of the acromion, marked with a #1 Vicryl suture.  The patient was very muscular.  The deltoid split was then maintained with self-retaining retractor.  Bursectomy performed.  Biceps tendon was then tagged with FiberLoop suture.  The bursa on top of the subscapularis tendon was dissected from the tendon.  The tendon was then mobilized.  It was then tacked down to the lesser tuberosity using 5.5 corkscrew.  The four suture tapes were brought through and tied. Tacking the retraction sutures were also utilized using 0 Vicryl suture. These sutures once tied were then taken into a PushLock into the intertubercular groove.  This gave excellent fixation of the subscapularis tear.  Biceps tendon was then  tenodesed using interference screw fixation about a 1.5 cm below the PushLock.  This gave excellent fixation of the biceps tendon.  At this time, attempt was made to mobilize the rotator cuff.  Tissue quality was extremely poor and mobilization was not successful.  Infraspinatus was intact. Subscapularis was repaired.  Anterior-posterior force couple was restored, but the supraspinatus was not repairable.  At this time, thorough irrigation was performed.  The deltoid split closed using #1 Vicryl suture followed by interrupted inverted #0 Vicryl suture, 2-0 Vicryl suture, and 3-0 Monocryl.  Impervious dressing placed.  Sling placed.  The patient tolerated the procedure well without immediate complications.  Transferred to the recovery room in stable condition.     Burnard Bunting, M.D.   ______________________________ Reece Agar. Dorene Grebe, MD    GSD/MEDQ  D:  11/02/2016  T:  11/03/2016  Job:  [id number]

## 2016-11-03 NOTE — Op Note (Deleted)
  The note originally documented on this encounter has been moved the the encounter in which it belongs.  

## 2016-11-08 ENCOUNTER — Ambulatory Visit (INDEPENDENT_AMBULATORY_CARE_PROVIDER_SITE_OTHER): Payer: 59 | Admitting: Orthopedic Surgery

## 2016-11-08 ENCOUNTER — Encounter (INDEPENDENT_AMBULATORY_CARE_PROVIDER_SITE_OTHER): Payer: Self-pay | Admitting: Orthopedic Surgery

## 2016-11-08 DIAGNOSIS — M75122 Complete rotator cuff tear or rupture of left shoulder, not specified as traumatic: Secondary | ICD-10-CM

## 2016-11-08 NOTE — Progress Notes (Signed)
   Post-Op Visit Note   Patient: [name]           Date of Birth: [birth year]           MRN: [id number] Visit Date: 11/08/2016 PCP: Elfredia Nevins, MD   Assessment & Plan:  Chief Complaint:  Chief Complaint  Patient presents with  . Left Shoulder - Routine Post Op   Visit Diagnoses: No diagnosis found.  Plan: [name] is a patient who is now a week out from left shoulder arthroscopy subscapularis rotator cuff tear repair.  Biceps tenodesis also done.  Supraspinatus not repairable.  On examination he has reasonable range of motion but lacks external rotation as expected plan is to refill pain medicine no lifting left arm follow-up in 2 weeks continue in sling  Follow-Up Instructions: Return in about 2 weeks (around 11/22/2016).   Orders:  No orders of the defined types were placed in this encounter.  No orders of the defined types were placed in this encounter.   Imaging: No results found.  PMFS History: Patient Active Problem List   Diagnosis Date Noted  . Complete tear of right rotator cuff 02/05/2016  . Complete rotator cuff tear of left shoulder 02/05/2016   Past Medical History:  Diagnosis Date  . Medical history non-contributory     No family history on file.  Past Surgical History:  Procedure Laterality Date  . Arm Manipulation Right 1995?  . INCISION AND DRAINAGE ABSCESS N/A 01/05/2013   Procedure: INCISION AND DRAINAGE PERIANAL  ABSCESS;  Surgeon: Dalia Heading, MD;  Location: [facility];  Service: General;  Laterality: N/A;  . SHOULDER ARTHROSCOPY WITH OPEN ROTATOR CUFF REPAIR AND DISTAL CLAVICLE ACROMINECTOMY Left 11/02/2016   Procedure: LEFT SHOULDER ARTHROSCOPY WITH MINI OPEN ROTATOR CUFF REPAIR, OPEN BICEPS TENODESIS, AND DEBRIDEMENT;  Surgeon: Cammy Copa, MD;  Location: [facility];  Service: Orthopedics;  Laterality: Left;  . SHOULDER ARTHROSCOPY WITH SUBACROMIAL DECOMPRESSION, ROTATOR CUFF REPAIR AND BICEP TENDON REPAIR Right 12/09/2015   Procedure:  SHOULDER DIAGNOSTIC OPERATIVE ARTHROSCOPY, SUBACROMIAL DECOMPRESSION, DISTAL CLAVICLE EXCISION, MINI-OPEN ROTATOR CUFF REPAIR AND BICEP TENODESIS.;  Surgeon: Cammy Copa, MD;  Location: [facility];  Service: Orthopedics;  Laterality: Right;  . widsom teeth  2017   Social History   Occupational History  . Not on file.   Social History Main Topics  . Smoking status: Never Smoker  . Smokeless tobacco: Never Used  . Alcohol use No  . Drug use: No  . Sexual activity: Not on file

## 2016-11-22 ENCOUNTER — Ambulatory Visit (INDEPENDENT_AMBULATORY_CARE_PROVIDER_SITE_OTHER): Payer: 59 | Admitting: Orthopedic Surgery

## 2016-12-01 ENCOUNTER — Encounter (INDEPENDENT_AMBULATORY_CARE_PROVIDER_SITE_OTHER): Payer: Self-pay | Admitting: Orthopedic Surgery

## 2016-12-01 ENCOUNTER — Ambulatory Visit (INDEPENDENT_AMBULATORY_CARE_PROVIDER_SITE_OTHER): Payer: 59 | Admitting: Orthopedic Surgery

## 2016-12-01 DIAGNOSIS — M75122 Complete rotator cuff tear or rupture of left shoulder, not specified as traumatic: Secondary | ICD-10-CM

## 2016-12-01 NOTE — Progress Notes (Signed)
Post-Op Visit Note   Patient: [name] Kelley           Date of Birth: 1986-01-25           MRN: [id number] Visit Date: 12/01/2016 PCP: Elfredia Nevins, MD   Assessment & Plan:  Chief Complaint:  Chief Complaint  Patient presents with  . Left Shoulder - Routine Post Op   Visit Diagnoses:  1. Complete rotator cuff tear of left shoulder     Plan: Na is a patient is now about a month out left shoulder arthroscopy with partial rotator cuff repair biceps tenodesis.  Hasn't started physical therapy yet.  On exam he has improving range of motion.  I'm going to send him to physical therapy to start work on rotator cuff strengthening but no biceps strengthening for more weeks.  Follow-up with me in 6 weeks for clinical recheck.  Supraspinatus was not repairable.  He may need superior capsular reconstruction in the future.  Follow-Up Instructions: Return in about 6 weeks (around 01/12/2017).   Orders:  No orders of the defined types were placed in this encounter.  No orders of the defined types were placed in this encounter.   Imaging: No results found.  PMFS History: Patient Active Problem List   Diagnosis Date Noted  . Complete tear of right rotator cuff 02/05/2016  . Complete rotator cuff tear of left shoulder 02/05/2016   Past Medical History:  Diagnosis Date  . Medical history non-contributory     No family history on file.  Past Surgical History:  Procedure Laterality Date  . Arm Manipulation Right 1995?  . INCISION AND DRAINAGE ABSCESS N/A 01/05/2013   Procedure: INCISION AND DRAINAGE PERIANAL  ABSCESS;  Surgeon: Dalia Heading, MD;  Location: [facility];  Service: General;  Laterality: N/A;  . SHOULDER ARTHROSCOPY WITH OPEN ROTATOR CUFF REPAIR AND DISTAL CLAVICLE ACROMINECTOMY Left 11/02/2016   Procedure: LEFT SHOULDER ARTHROSCOPY WITH MINI OPEN ROTATOR CUFF REPAIR, OPEN BICEPS TENODESIS, AND DEBRIDEMENT;  Surgeon: Cammy Copa, MD;  Location: [facility];  Service:  Orthopedics;  Laterality: Left;  . SHOULDER ARTHROSCOPY WITH SUBACROMIAL DECOMPRESSION, ROTATOR CUFF REPAIR AND BICEP TENDON REPAIR Right 12/09/2015   Procedure: SHOULDER DIAGNOSTIC OPERATIVE ARTHROSCOPY, SUBACROMIAL DECOMPRESSION, DISTAL CLAVICLE EXCISION, MINI-OPEN ROTATOR CUFF REPAIR AND BICEP TENODESIS.;  Surgeon: Cammy Copa, MD;  Location: [facility];  Service: Orthopedics;  Laterality: Right;  . widsom teeth  2017   Social History   Occupational History  . Not on file.   Social History Main Topics  . Smoking status: Never Smoker  . Smokeless tobacco: Never Used  . Alcohol use No  . Drug use: No  . Sexual activity: Not on file

## 2016-12-21 ENCOUNTER — Ambulatory Visit (HOSPITAL_COMMUNITY): Payer: 59 | Attending: Orthopedic Surgery | Admitting: Occupational Therapy

## 2016-12-21 ENCOUNTER — Encounter (HOSPITAL_COMMUNITY): Payer: Self-pay | Admitting: Occupational Therapy

## 2016-12-21 DIAGNOSIS — M25512 Pain in left shoulder: Secondary | ICD-10-CM | POA: Insufficient documentation

## 2016-12-21 DIAGNOSIS — R29898 Other symptoms and signs involving the musculoskeletal system: Secondary | ICD-10-CM | POA: Diagnosis not present

## 2016-12-21 NOTE — Therapy (Signed)
[facility] [address] [place], [place], [address] Phone: [phone]   Fax:  [phone]  Occupational Therapy Evaluation  Patient Details  Name: [name] Kelley MRN: [id number] Date of Birth: 05/15/1985 Referring Provider: Dr. Cammy CopaGregory Scott Dean   Encounter Date: 12/21/2016  OT End of Session - 12/21/16 1703    Visit Number  1    Number of Visits  12    Date for OT Re-Evaluation  02/04/17   mini reassessment 01/18/2017   Authorization Type  UMR    Authorization Time Period  24 visit limit    Authorization - Visit Number  1    Authorization - Number of Visits  24    OT Start Time  1359    OT Stop Time  1427    OT Time Calculation (min)  28 min    Activity Tolerance  Patient tolerated treatment well    Behavior During Therapy  [facility] for tasks assessed/performed       Past Medical History:  Diagnosis Date  . Medical history non-contributory     Past Surgical History:  Procedure Laterality Date  . Arm Manipulation Right 1995?  . widsom teeth  2017    There were no vitals filed for this visit.  Subjective Assessment - 12/21/16 1659    Subjective   S: He released me from the sling about a week ago.     Pertinent History  Pt is a 31 y/o male s/p left mini-open RCR, biceps tendon repair, SAD, and debridement on 11/02/16. Pt injured shoulder approximately 1 year ago while at work, also had R RCR approximately 1 year ago. Dr. Cammy CopaGregory Scott Dean referred pt to occupational therapy for evaluation and treatment.     Patient Stated Goals  To be able to use my arm normally.     Currently in Pain?  No/denies        [facility] OT Assessment - 12/21/16 1358      Assessment   Diagnosis  s/p left mini-open RCR, debridement, SAD, biceps tendon release    Referring Provider  Dr. Cammy CopaGregory Scott Dean    Onset Date  11/02/16    Prior Therapy  None      Precautions   Precautions  Shoulder    Type of Shoulder Precautions  ROM and strengthening  progress as tolerated      Balance Screen   Has the patient fallen in the past 6 months  No    Has the patient had a decrease in activity level because of a fear of falling?   No    Is the patient reluctant to leave their home because of a fear of falling?   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 and MD    Leisure  riding motorcycle, outdoor activities      ADL   ADL comments  pt is having difficulty with dressing, bathing, reaching overhead overhead, opening jars      Written Expression   Dominant Hand  Right      Cognition   Overall Cognitive Status  Within Functional Limits for tasks assessed      ROM / Strength   AROM / PROM / Strength  AROM;PROM;Strength      Palpation   Palpation comment  Moderate fascial restrictions along left deltoid, trapezius, and scapularis regions      AROM   Overall AROM Comments  Assessed seated, er/IR adducted    AROM Assessment Site  Shoulder    Right/Left Shoulder  Left    Left Shoulder Flexion  116 Degrees    Left Shoulder ABduction  87 Degrees    Left Shoulder Internal Rotation  90 Degrees    Left Shoulder External Rotation  48 Degrees      PROM   Overall PROM Comments  Assessed supine,er/IR adducted    PROM Assessment Site  Shoulder    Right/Left Shoulder  Left    Left Shoulder Flexion  168 Degrees    Left Shoulder ABduction  180 Degrees    Left Shoulder Internal Rotation  90 Degrees    Left Shoulder External Rotation  90 Degrees      Strength   Overall Strength Comments  Assessed supine, er/IR adducted    Strength Assessment Site  Shoulder    Right/Left Shoulder  Left    Left Shoulder Flexion  3/5    Left Shoulder ABduction  3-/5    Left Shoulder Internal Rotation  3/5    Left Shoulder External Rotation  3/5                      OT Education - 12/21/16  1703    Education provided  Yes    Education Details  shoulder stretches    Person(s) Educated  Patient    Methods  Explanation;Demonstration;Handout    Comprehension  Verbalized understanding;Returned demonstration       OT Short Term Goals - 12/21/16 1707      OT SHORT TERM GOAL #1   Title  Pt will be provided with and independent in HEP to improve mobility required for ADL completion.     Time  3    Period  Weeks    Status  New    Target Date  02/04/17      OT SHORT TERM GOAL #2   Title  Pt will improve LUE A/ROM to WNL to increase ability to reach behind back when bathing.     Time  6    Period  Weeks    Status  New      OT SHORT TERM GOAL #3   Title  Pt will decrease LUE fascial restrictions from mod to min amounts or less to improve mobility required for overhead reaching tasks.     Time  6    Period  Weeks    Status  New      OT SHORT TERM GOAL #4   Title  Pt will decrease pain in LUE to 2/10 or less to improve ability to perform ADLs using LUE as non-dominant.     Time  6    Period  Weeks    Status  New      OT SHORT TERM GOAL #5   Title  Pt will improve LUE strength to 5/5 to increase ability to drive motorcycle.     Time  6    Period  Weeks    Status  New      Additional Short Term Goals   Additional Short Term Goals  Yes      OT SHORT TERM GOAL #6   Title  Pt will return to highest level of functioning and independence in B/IADL completion using LUE as non-dominant.     Time  6    Period  Weeks    Status  New               Plan - 12/21/16 1704    Clinical Impression Statement  A: Pt is a 31 y/o male s/p left biceps tendon repair with mini-open RCR on 11/02/16. Pt presents with pain, weakness, and decreased ROM limiting ability to use LUE during functional tasks as non-dominant. Pt educated on shoulder stretches for HEP today. Pt will begin therapy 2x/week however may decreased to 1x/week dependent upon progress.     Occupational Profile and  client history currently impacting functional performance  Pt is independent and is motivated to return to work    Occupational performance deficits (Please refer to evaluation for details):  ADL's;IADL's;Rest and Sleep;Work;Leisure    Rehab Potential  Good    OT Frequency  2x / week    OT Duration  6 weeks    OT Treatment/Interventions  Self-care/ADL training;Ultrasound;Patient/family education;Passive range of motion;Cryotherapy;Moist Heat;Electrical Stimulation;Therapeutic exercises;Manual Therapy;Therapeutic activities    Plan  P: Pt will benefit from skilled OT services to decrease pain and fascial restrictions, increase joint range of motion, strength, and functional use of LUE as non-dominant. Treatment plan: myofascial release, manual therapy, P/ROM, AA/ROM, A/ROM, scapular mobility and strengthening, general LUE strengthening, modalities prn    Clinical Decision Making  Limited treatment options, no task modification necessary    OT Home Exercise Plan  11/6: shoulder stretches    Consulted and Agree with Plan of Care  Patient       Patient will benefit from skilled therapeutic intervention in order to improve the following deficits and impairments:  Impaired flexibility, Decreased strength, Decreased activity tolerance, Pain, Decreased range of motion, Increased fascial restricitons, Impaired UE functional use  Visit Diagnosis: Acute pain of left shoulder  Other symptoms and signs involving the musculoskeletal system    Problem List Patient Active Problem List   Diagnosis Date Noted  . Complete tear of right rotator cuff 02/05/2016  . Complete rotator cuff tear of left shoulder 02/05/2016   Ezra Sites, OTR/L  [phone] 12/21/2016, 5:15 PM  [facility] [address] [place], [place], [address] Phone: [phone]   Fax:  [phone]  Name: [name] Kelley MRN: [id number] Date of Birth: Sep 02, 1985

## 2016-12-21 NOTE — Patient Instructions (Signed)
  1) Flexion Wall Stretch    Face wall, place affected handon wall in front of you. Slide hand up the wall  and lean body in towards the wall. Hold for 10 seconds. Repeat 3-5 times. 1-2 times/day.    2) Corner Stretch    Stand at a corner of a wall, place your arms on the walls with elbows bent. Lean into the corner until a stretch is felt along the front of your chest and/or shoulders. Hold for 10 seconds. Repeat 3-5X, 1-2 times/day.    3) Posterior Capsule Stretch    Bring the involved arm across chest. Grasp elbow and pull toward chest until you feel a stretch in the back of the upper arm and shoulder. Hold 10 seconds. Repeat 3-5X. Complete 1-2 times/day.    4) Scapular Retraction    Tuck chin back as you pinch shoulder blades together.  Hold 5 seconds. Repeat 3-5X. Complete 1-2 times/day.    5) External Rotation Stretch:     Place your affected hand on the wall with the elbow bent and gently turn your body the opposite direction until a stretch is felt. Hold 10 seconds, repeat 3-5X. Complete 1-2 times/day.   OR    Standing in an open doorway, place your arm on the edge of the doorway with the shoulder at 90 degrees from the side and elbow bent to 90 degrees. Lean forward until you feel a stretch on the front of your shoulder. Keep neck relaxed. Hold 10 seconds, repeat 3-5X. Complete 1-2 times/day

## 2016-12-23 ENCOUNTER — Ambulatory Visit (HOSPITAL_COMMUNITY): Payer: 59

## 2016-12-23 DIAGNOSIS — M25512 Pain in left shoulder: Secondary | ICD-10-CM | POA: Diagnosis not present

## 2016-12-23 DIAGNOSIS — R29898 Other symptoms and signs involving the musculoskeletal system: Secondary | ICD-10-CM | POA: Diagnosis not present

## 2016-12-23 NOTE — Therapy (Signed)
[facility] [address] [place], [place], [address] Phone: [phone]   Fax:  [phone]  Occupational Therapy Treatment  Patient Details  Name: [name] Kelley MRN: [id number] Date of Birth: 09-30-1985 Referring Provider: Dr. Cammy Copa   Encounter Date: 12/23/2016  OT End of Session - 12/23/16 1256    Visit Number  2    Number of Visits  12    Date for OT Re-Evaluation  02/04/17 mini reassessment 01/18/2017    Authorization Type  UMR    Authorization Time Period  24 visit limit    Authorization - Visit Number  2    Authorization - Number of Visits  24    OT Start Time  1038    OT Stop Time  1115    OT Time Calculation (min)  37 min    Activity Tolerance  Patient tolerated treatment well    Behavior During Therapy  [facility] for tasks assessed/performed       Past Medical History:  Diagnosis Date  . Medical history non-contributory     Past Surgical History:  Procedure Laterality Date  . Arm Manipulation Right 1995?  . widsom teeth  2017    There were no vitals filed for this visit.  Subjective Assessment - 12/23/16 1043    Subjective   S: I try to do everything that I can.    Currently in Pain?  No/denies         [facility] OT Assessment - 12/23/16 1053      Assessment   Diagnosis  s/p left mini-open RCR, debridement, SAD, biceps tendon release      Precautions   Precautions  Shoulder    Type of Shoulder Precautions  ROM and strengthening progress as tolerated               OT Treatments/Exercises (OP) - 12/23/16 1053      Exercises   Exercises  Shoulder      Shoulder Exercises: Supine   Protraction  PROM;5 reps;AROM;12 reps    Horizontal ABduction  PROM;5 reps;AROM;12 reps    External Rotation  PROM;5 reps;AROM;12 reps    Internal Rotation  PROM;5 reps;AROM;12 reps    Flexion  PROM;5 reps;AROM;12 reps    ABduction  PROM;5 reps;AROM;12 reps      Shoulder Exercises: Standing   Flexion  AROM;12  reps    ABduction  AROM;12 reps      Manual Therapy   Manual Therapy  Myofascial release    Manual therapy comments  Manual therapy completed prior to exercises.    Myofascial Release  Myofascial release and manaul  stretching completed to left upper arm, trapezius, and scapularis region to decrease fascial restrictions and increase joint mobility in a pain free zone.              OT Education - 12/23/16 1254    Education provided  Yes    Education Details  Pt was given OT handout of evaluation. Reviewed goals and plan of care    Person(s) Educated  Patient    Methods  Explanation;Handout    Comprehension  Verbalized understanding       OT Short Term Goals - 12/23/16 1057      OT SHORT TERM GOAL #1   Title  Pt will be provided with and independent in HEP to improve mobility required for ADL completion.     Time  3    Period  Weeks  Status  On-going      OT SHORT TERM GOAL #2   Title  Pt will improve LUE A/ROM to WNL to increase ability to reach behind back when bathing.     Time  6    Period  Weeks    Status  On-going      OT SHORT TERM GOAL #3   Title  Pt will decrease LUE fascial restrictions from mod to min amounts or less to improve mobility required for overhead reaching tasks.     Time  6    Period  Weeks    Status  On-going      OT SHORT TERM GOAL #4   Title  Pt will decrease pain in LUE to 2/10 or less to improve ability to perform ADLs using LUE as non-dominant.     Time  6    Period  Weeks    Status  On-going      OT SHORT TERM GOAL #5   Title  Pt will improve LUE strength to 5/5 to increase ability to drive motorcycle.     Time  6    Period  Weeks    Status  On-going      OT SHORT TERM GOAL #6   Title  Pt will return to highest level of functioning and independence in B/IADL completion using LUE as non-dominant.     Time  6    Period  Weeks    Status  On-going               Plan - 12/23/16 1402    Clinical Impression Statement  A:  Initiated myofascial release, manual stretching, and A/ROM this session. patient was able to complete full ROM both passively and actively. Some tightness and muscle fatigue noted during exercises. VC for form and technique.    Plan  P: Complete remaining A/ROM exercises standing that were missed. Add scapular strengthening with theraband. Update HEP as needed.     Consulted and Agree with Plan of Care  Patient       Patient will benefit from skilled therapeutic intervention in order to improve the following deficits and impairments:  Impaired flexibility, Decreased strength, Decreased activity tolerance, Pain, Decreased range of motion, Increased fascial restricitons, Impaired UE functional use  Visit Diagnosis: Acute pain of left shoulder  Other symptoms and signs involving the musculoskeletal system    Problem List Patient Active Problem List   Diagnosis Date Noted  . Complete tear of right rotator cuff 02/05/2016  . Complete rotator cuff tear of left shoulder 02/05/2016   [name] Kelley, OTR/L,CBIS  [phone]  12/23/2016, 2:10 PM  [place] [address] [place], [place], [address] Phone: [phone]   Fax:  [phone]  Name: [name] Kelley MRN: [id number] Date of Birth: Feb 12, 1986

## 2016-12-27 ENCOUNTER — Encounter (HOSPITAL_COMMUNITY): Payer: Self-pay | Admitting: Specialist

## 2016-12-27 ENCOUNTER — Ambulatory Visit (HOSPITAL_COMMUNITY): Payer: 59 | Admitting: Specialist

## 2016-12-27 DIAGNOSIS — M25512 Pain in left shoulder: Secondary | ICD-10-CM | POA: Diagnosis not present

## 2016-12-27 DIAGNOSIS — R29898 Other symptoms and signs involving the musculoskeletal system: Secondary | ICD-10-CM | POA: Diagnosis not present

## 2016-12-27 NOTE — Therapy (Signed)
[facility] [address] [place], [place], [address] Phone: [phone]   Fax:  [phone]  Occupational Therapy Treatment  Patient Details  Name: Josephine CablesDennis J Harkleroad MRN: [id number] Date of Birth: December 05, 1985 Referring Provider: Dr. Cammy CopaGregory Scott Dean   Encounter Date: 12/27/2016  OT End of Session - 12/27/16 1239    Visit Number  3    Number of Visits  12    Date for OT Re-Evaluation  02/04/17 mini reassess on 01/18/17    Authorization Type  UMR    Authorization Time Period  24 visit limit    Authorization - Visit Number  3    Authorization - Number of Visits  24    OT Start Time  1035    OT Stop Time  1114    OT Time Calculation (min)  39 min       Past Medical History:  Diagnosis Date  . Medical history non-contributory     Past Surgical History:  Procedure Laterality Date  . Arm Manipulation Right 1995?  . widsom teeth  2017    There were no vitals filed for this visit.  Subjective Assessment - 12/27/16 1239    Subjective   S:  Its burning with these exercises today    Currently in Pain?  No/denies         [facility] - 12/27/16 0001      Assessment   Diagnosis  s/p left mini-open RCR, debridement, SAD, biceps tendon release      Precautions   Precautions  Shoulder    Type of Shoulder Precautions  ROM and strengthening progress as tolerated               OT Treatments/Exercises (OP) - 12/27/16 0001      Exercises   Exercises  Shoulder      Shoulder Exercises: Supine   Protraction  PROM;5 reps;AROM;15 reps    Horizontal ABduction  PROM;5 reps;AROM;15 reps    External Rotation  PROM;5 reps;AROM;15 reps    Internal Rotation  PROM;5 reps;AROM;15 reps    Flexion  PROM;5 reps;AROM;15 reps    ABduction  PROM;5 reps;AROM;15 reps      Shoulder Exercises: Standing   Protraction  AROM;10 reps    Horizontal ABduction  AROM;10 reps    External Rotation  AROM;10 reps    Internal Rotation  AROM;10  reps    Flexion  AROM;10 reps    ABduction  AROM;10 reps    Extension  Theraband;10 reps    Theraband Level (Shoulder Extension)  Level 2 (Red)    Row  Theraband;10 reps    Theraband Level (Shoulder Row)  Level 2 (Red)    Retraction  Theraband;10 reps    Theraband Level (Shoulder Retraction)  Level 2 (Red)      Shoulder Exercises: ROM/Strengthening   Rebounder  3 minutes reverse     Proximal Shoulder Strengthening, Supine  10 times each no rest    Proximal Shoulder Strengthening, Seated  10 times each without resting       Manual Therapy   Manual Therapy  Myofascial release    Manual therapy comments  Manual therapy completed prior to exercises.    Myofascial Release  Myofascial release and manaul  stretching completed to left upper arm, trapezius, and scapularis region to decrease fascial restrictions and increase joint mobility in a pain free zone.  OT Short Term Goals - 12/23/16 1057      OT SHORT TERM GOAL #1   Title  Pt will be provided with and independent in HEP to improve mobility required for ADL completion.     Time  3    Period  Weeks    Status  On-going      OT SHORT TERM GOAL #2   Title  Pt will improve LUE A/ROM to WNL to increase ability to reach behind back when bathing.     Time  6    Period  Weeks    Status  On-going      OT SHORT TERM GOAL #3   Title  Pt will decrease LUE fascial restrictions from mod to min amounts or less to improve mobility required for overhead reaching tasks.     Time  6    Period  Weeks    Status  On-going      OT SHORT TERM GOAL #4   Title  Pt will decrease pain in LUE to 2/10 or less to improve ability to perform ADLs using LUE as non-dominant.     Time  6    Period  Weeks    Status  On-going      OT SHORT TERM GOAL #5   Title  Pt will improve LUE strength to 5/5 to increase ability to drive motorcycle.     Time  6    Period  Weeks    Status  On-going      OT SHORT TERM GOAL #6   Title  Pt will  return to highest level of functioning and independence in B/IADL completion using LUE as non-dominant.     Time  6    Period  Weeks    Status  On-going               Plan - 12/27/16 1245    Clinical Impression Statement  A:  Patient has increased pain and difficulty with A/ROM horizontal abduction in standing.  Arm fatigued with wall wash activity for 1 minute.     Plan  P:  Complete horizontal abduction with less difficulty in standing.  add external and internal rotation with theraband.         Patient will benefit from skilled therapeutic intervention in order to improve the following deficits and impairments:  Impaired flexibility, Decreased strength, Decreased activity tolerance, Pain, Decreased range of motion, Increased fascial restricitons, Impaired UE functional use  Visit Diagnosis: Acute pain of left shoulder  Other symptoms and signs involving the musculoskeletal system    Problem List Patient Active Problem List   Diagnosis Date Noted  . Complete tear of right rotator cuff 02/05/2016  . Complete rotator cuff tear of left shoulder 02/05/2016    Shirlean MylarBethany H. Dianna Ewald, [place], OTR/L [phone]  12/27/2016, 12:47 PM  [facility] [address] [place], [place], [address] Phone: [phone]   Fax:  [phone]  Name: Josephine CablesDennis J Wheless MRN: [id number] Date of Birth: 11-23-1985

## 2016-12-29 ENCOUNTER — Ambulatory Visit (HOSPITAL_COMMUNITY): Payer: 59

## 2016-12-29 DIAGNOSIS — M25512 Pain in left shoulder: Secondary | ICD-10-CM | POA: Diagnosis not present

## 2016-12-29 DIAGNOSIS — R29898 Other symptoms and signs involving the musculoskeletal system: Secondary | ICD-10-CM | POA: Diagnosis not present

## 2016-12-29 NOTE — Therapy (Signed)
[facility] [address] [place], [place], [address] Phone: [phone]   Fax:  [phone]  Occupational Therapy Treatment  Patient Details  Name: [name] Kelley MRN: [id number] Date of Birth: [birth year] Referring Provider: Dr. Cammy CopaGregory Scott Dean   Encounter Date: 12/29/2016  OT End of Session - 12/29/16 1101    Visit Number  4    Number of Visits  12    Date for OT Re-Evaluation  02/04/17 mini reassess on 01/18/17    Authorization Type  UMR    Authorization Time Period  24 visit limit    Authorization - Visit Number  4    Authorization - Number of Visits  24    OT Start Time  1040    OT Stop Time  1115    OT Time Calculation (min)  35 min    Activity Tolerance  Patient tolerated treatment well    Behavior During Therapy  [facility] for tasks assessed/performed       Past Medical History:  Diagnosis Date  . Medical history non-contributory     Past Surgical History:  Procedure Laterality Date  . Arm Manipulation Right 1995?  . widsom teeth  2017    There were no vitals filed for this visit.  Subjective Assessment - 12/29/16 1142    Subjective   S: I could tell it was weak last time.    Currently in Pain?  No/denies         [facility] - 12/29/16 1057      Assessment   Diagnosis  s/p left mini-open RCR, debridement, SAD, biceps tendon release      Precautions   Precautions  Shoulder    Type of Shoulder Precautions  ROM and strengthening progress as tolerated               OT Treatments/Exercises (OP) - 12/29/16 1057      Exercises   Exercises  Shoulder      Shoulder Exercises: Supine   Protraction  PROM;5 reps;AROM;15 reps    Horizontal ABduction  PROM;5 reps;AROM;15 reps    External Rotation  PROM;5 reps;AROM;15 reps    Internal Rotation  PROM;5 reps;AROM;15 reps    Flexion  PROM;5 reps;AROM;15 reps    ABduction  PROM;5 reps;AROM;15 reps      Shoulder Exercises: Standing   Protraction   AROM;10 reps    Horizontal ABduction  AROM;10 reps    External Rotation  AROM;10 reps    Internal Rotation  AROM;10 reps    Flexion  AROM;10 reps    ABduction  AROM;10 reps      Shoulder Exercises: ROM/Strengthening   Proximal Shoulder Strengthening, Supine  10 times each no rest      Manual Therapy   Manual Therapy  Myofascial release    Manual therapy comments  Manual therapy completed prior to exercises.    Myofascial Release  Myofascial release and manaul  stretching completed to left upper arm, trapezius, and scapularis region to decrease fascial restrictions and increase joint mobility in a pain free zone.                OT Short Term Goals - 12/23/16 1057      OT SHORT TERM GOAL #1   Title  Pt will be provided with and independent in HEP to improve mobility required for ADL completion.     Time  3    Period  Weeks    Status  On-going      OT SHORT TERM GOAL #2   Title  Pt will improve LUE A/ROM to WNL to increase ability to reach behind back when bathing.     Time  6    Period  Weeks    Status  On-going      OT SHORT TERM GOAL #3   Title  Pt will decrease LUE fascial restrictions from mod to min amounts or less to improve mobility required for overhead reaching tasks.     Time  6    Period  Weeks    Status  On-going      OT SHORT TERM GOAL #4   Title  Pt will decrease pain in LUE to 2/10 or less to improve ability to perform ADLs using LUE as non-dominant.     Time  6    Period  Weeks    Status  On-going      OT SHORT TERM GOAL #5   Title  Pt will improve LUE strength to 5/5 to increase ability to drive motorcycle.     Time  6    Period  Weeks    Status  On-going      OT SHORT TERM GOAL #6   Title  Pt will return to highest level of functioning and independence in B/IADL completion using LUE as non-dominant.     Time  6    Period  Weeks    Status  On-going               Plan - 12/29/16 1108    Clinical Impression Statement  A: Able to  complete horizontal abduction with slightly less difficulty. He did require one rest break before finishing the last two repetitions. Upgraded HEP to include scapular strengthening and IR/er with red band. VC for form and technique.    Plan  P: Goes by "DJ." Continue to work on strengthening shoulder and scapular stability. Add X to V arms.       Patient will benefit from skilled therapeutic intervention in order to improve the following deficits and impairments:  Impaired flexibility, Decreased strength, Decreased activity tolerance, Pain, Decreased range of motion, Increased fascial restricitons, Impaired UE functional use  Visit Diagnosis: Acute pain of left shoulder  Other symptoms and signs involving the musculoskeletal system    Problem List Patient Active Problem List   Diagnosis Date Noted  . Complete tear of right rotator cuff 02/05/2016  . Complete rotator cuff tear of left shoulder 02/05/2016   Limmie PatriciaLaura Kalina Morabito, OTR/L,CBIS  [phone]  12/29/2016, 11:43 AM  [facility] [address] , [place], [address] Phone: [phone]   Fax:  [phone]  Name: [name] Kelley MRN: [id number] Date of Birth: 01-03-1986

## 2016-12-29 NOTE — Patient Instructions (Addendum)
(  Home) Extension: Isometric / Bilateral Arm Retraction - Sitting   Facing anchor, hold hands and elbow at shoulder height, with elbow bent.  Pull arms back to squeeze shoulder blades together. Repeat 10-15 times. 1-3 times/day.   (Clinic) Extension / Flexion (Assist)   Face anchor, pull arms back, keeping elbow straight, and squeze shoulder blades together. Repeat 10-15 times. 1-3 times/day.   Copyright  VHI. All rights reserved.    (Home) Retraction: Row - Bilateral (Anchor)   Facing anchor, arms reaching forward, pull hands toward stomach, keeping elbows bent and at your sides and pinching shoulder blades together. Repeat 10-15 times. 1-3 times/day.   Copyright  VHI. All rights reserved.      4) Internal & External Rotation    Stand with elbows at the side and elbows bent 90 degrees. Move your forearms away from your body, then bring back inward toward the body.  Place a rolled towel between your elbow and side.

## 2017-01-03 ENCOUNTER — Ambulatory Visit (HOSPITAL_COMMUNITY): Payer: 59 | Admitting: Specialist

## 2017-01-03 ENCOUNTER — Encounter (HOSPITAL_COMMUNITY): Payer: Self-pay | Admitting: Specialist

## 2017-01-03 DIAGNOSIS — M25512 Pain in left shoulder: Secondary | ICD-10-CM | POA: Diagnosis not present

## 2017-01-03 DIAGNOSIS — R29898 Other symptoms and signs involving the musculoskeletal system: Secondary | ICD-10-CM | POA: Diagnosis not present

## 2017-01-03 NOTE — Therapy (Signed)
[facility] [address] [place], [place], [address] Phone: [phone]   Fax:  [phone]  Occupational Therapy Treatment  Patient Details  Name: [name] Kelley MRN: [id number] Date of Birth: 17-May-1985 Referring Provider: Dr. Cammy Copa   Encounter Date: 01/03/2017  OT End of Session - 01/03/17 1240    Visit Number  5    Number of Visits  12    Date for OT Re-Evaluation  02/04/17 mini reassess on 01/18/17    Authorization Type  UMR    Authorization Time Period  24 visit limit    Authorization - Visit Number  5    Authorization - Number of Visits  24    OT Start Time  1033    OT Stop Time  1117    OT Time Calculation (min)  44 min    Activity Tolerance  Patient tolerated treatment well    Behavior During Therapy  [facility] for tasks assessed/performed       Past Medical History:  Diagnosis Date  . Medical history non-contributory     Past Surgical History:  Procedure Laterality Date  . Arm Manipulation Right 1995?  . INCISION AND DRAINAGE PERIANAL  ABSCESS N/A 01/05/2013   Performed by Dalia Heading, MD at [facility] ORS  . LEFT SHOULDER ARTHROSCOPY WITH MINI OPEN ROTATOR CUFF REPAIR, OPEN BICEPS TENODESIS, AND DEBRIDEMENT Left 11/02/2016   Performed by Cammy Copa, MD at [facility] OR  . SHOULDER DIAGNOSTIC OPERATIVE ARTHROSCOPY, SUBACROMIAL DECOMPRESSION, DISTAL CLAVICLE EXCISION, MINI-OPEN ROTATOR CUFF REPAIR AND BICEP TENODESIS. Right 12/09/2015   Performed by Cammy Copa, MD at [facility] OR  . widsom teeth  2017    There were no vitals filed for this visit.  Subjective Assessment - 01/03/17 1239    Subjective   S:  It is still difficult to complete exercises (horizontal abduction, retraction) without my arm getting tired.     Currently in Pain?  No/denies         [facility] OT Assessment - 01/03/17 0001      Assessment   Diagnosis  s/p left mini-open RCR, debridement, SAD, biceps tendon release      Precautions    Precautions  Shoulder    Type of Shoulder Precautions  ROM and strengthening progress as tolerated               OT Treatments/Exercises (OP) - 01/03/17 0001      Exercises   Exercises  Shoulder      Shoulder Exercises: Supine   Protraction  PROM;5 reps;AROM;15 reps    Horizontal ABduction  PROM;5 reps;AROM;15 reps    External Rotation  PROM;5 reps;AROM;15 reps    Internal Rotation  PROM;5 reps;AROM;15 reps    Flexion  PROM;5 reps;AROM;15 reps    ABduction  PROM;5 reps;AROM;15 reps      Shoulder Exercises: Standing   Protraction  AROM;12 reps    Horizontal ABduction  AROM;12 reps    Horizontal ABduction Limitations  one rest break    External Rotation  AROM;12 reps;Theraband;15 reps    Theraband Level (Shoulder External Rotation)  Level 2 (Red)    Internal Rotation  AROM;12 reps;Theraband;15 reps    Theraband Level (Shoulder Internal Rotation)  Level 2 (Red)    Flexion  AROM;12 reps    ABduction  AROM;12 reps    ABduction Limitations  one rest break    Extension  Theraband;15 reps    Theraband Level (Shoulder Extension)  Level  2 (Red)    Row  Theraband;15 reps    Theraband Level (Shoulder Row)  Level 2 (Red)    Retraction  Theraband;15 reps    Theraband Level (Shoulder Retraction)  Level 2 (Red)      Shoulder Exercises: ROM/Strengthening   Rebounder  3 minutes reverse  1.0    "W" Arms  10 times    X to V Arms  10 times    Proximal Shoulder Strengthening, Supine  10 times each no rest    Proximal Shoulder Strengthening, Seated  10 times each without resting     Ball on Wall  1' with arm flexed to 90 and 1' with arm abducted to 90 **patient requests abduction prior to flexion at next visit      Manual Therapy   Manual Therapy  Myofascial release    Manual therapy comments  Manual therapy completed prior to exercises.    Myofascial Release  Myofascial release and manaul  stretching completed to left upper arm, trapezius, and scapularis region to decrease fascial  restrictions and increase joint mobility in a pain free zone.                OT Short Term Goals - 12/23/16 1057      OT SHORT TERM GOAL #1   Title  Pt will be provided with and independent in HEP to improve mobility required for ADL completion.     Time  3    Period  Weeks    Status  On-going      OT SHORT TERM GOAL #2   Title  Pt will improve LUE A/ROM to WNL to increase ability to reach behind back when bathing.     Time  6    Period  Weeks    Status  On-going      OT SHORT TERM GOAL #3   Title  Pt will decrease LUE fascial restrictions from mod to min amounts or less to improve mobility required for overhead reaching tasks.     Time  6    Period  Weeks    Status  On-going      OT SHORT TERM GOAL #4   Title  Pt will decrease pain in LUE to 2/10 or less to improve ability to perform ADLs using LUE as non-dominant.     Time  6    Period  Weeks    Status  On-going      OT SHORT TERM GOAL #5   Title  Pt will improve LUE strength to 5/5 to increase ability to drive motorcycle.     Time  6    Period  Weeks    Status  On-going      OT SHORT TERM GOAL #6   Title  Pt will return to highest level of functioning and independence in B/IADL completion using LUE as non-dominant.     Time  6    Period  Weeks    Status  On-going               Plan - 01/03/17 1240    Clinical Impression Statement  A:  completed all a/rom with good form.  Requires verbal cuing and tactile cues with horizontal abduction, retraction, and ball on wall with arm abducted to avoid scapular elevation.      Plan  P:  Complete scapular stability exercises with less cuing for depressing scapula.  reassess for md visit on 01/10/17  Patient will benefit from skilled therapeutic intervention in order to improve the following deficits and impairments:     Visit Diagnosis: Acute pain of left shoulder  Other symptoms and signs involving the musculoskeletal system    Problem  List Patient Active Problem List   Diagnosis Date Noted  . Complete tear of right rotator cuff 02/05/2016  . Complete rotator cuff tear of left shoulder 02/05/2016    Shirlean MylarBethany H. Murray, [place]HA, OTR/L [phone]  01/03/2017, 12:46 PM  [place] [facility] [address] [place], [place], [address] Phone: [phone]   Fax:  [phone]  Name: Josephine CablesDennis J Gfeller MRN: [id number] Date of Birth: 12-04-1985

## 2017-01-05 ENCOUNTER — Telehealth (HOSPITAL_COMMUNITY): Payer: Self-pay | Admitting: Internal Medicine

## 2017-01-05 ENCOUNTER — Ambulatory Visit (HOSPITAL_COMMUNITY): Payer: 59

## 2017-01-05 NOTE — Telephone Encounter (Signed)
01/05/17  pt left a message to cancel said he wasn't feeling well

## 2017-01-10 ENCOUNTER — Ambulatory Visit (INDEPENDENT_AMBULATORY_CARE_PROVIDER_SITE_OTHER): Payer: 59 | Admitting: Orthopedic Surgery

## 2017-01-10 ENCOUNTER — Encounter (INDEPENDENT_AMBULATORY_CARE_PROVIDER_SITE_OTHER): Payer: Self-pay | Admitting: Orthopedic Surgery

## 2017-01-10 DIAGNOSIS — M75122 Complete rotator cuff tear or rupture of left shoulder, not specified as traumatic: Secondary | ICD-10-CM

## 2017-01-10 NOTE — Progress Notes (Signed)
Post-Op Visit Note   Patient: [name]           Date of Birth: 04-27-1985           MRN: [id number] Visit Date: 01/10/2017 PCP: Elfredia Nevins, MD   Assessment & Plan:  Chief Complaint:  Chief Complaint  Patient presents with  . Left Shoulder - Routine Post Op   Visit Diagnoses:  1. Complete rotator cuff tear of left shoulder     Plan: [name] Kelley is a patient who is now 2 months out left shoulder arthroscopy and partial rotator cuff repair and biceps tenodesis.  In general he's doing better but he still has pain with certain movements including abduction with the elbow flexed on the left.  Still is going to physical therapy to improve his strength.  Taking oxycodone some days but not all days.  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  On exam he's got improving shoulder range of motion and strength.  Still has and I would expect him to have coarseness and grinding at 90 of abduction with forward flexion.  Plan at this time is continue therapy and out of work 6 more weeks.  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  That can be done at a later time when he is having more pain.  I did tell him that that is a bridge procedure to be done prior to any type of shoulder replacement.  He is about 30 years too young for shoulder replacement at this time  Follow-Up Instructions: Return in about 6 weeks (around 02/21/2017).   Orders:  No orders of the defined types were placed in this encounter.  No orders of the defined types were placed in this encounter.   Imaging: No results found.  PMFS History: Patient Active Problem List   Diagnosis Date Noted  . Complete tear of right rotator cuff 02/05/2016  . Complete rotator cuff tear of left shoulder 02/05/2016   Past Medical History:  Diagnosis Date  . Medical history non-contributory     History reviewed. No  pertinent family history.  Past Surgical History:  Procedure Laterality Date  . Arm Manipulation Right 1995?  . INCISION AND DRAINAGE ABSCESS N/A 01/05/2013   Procedure: INCISION AND DRAINAGE PERIANAL  ABSCESS;  Surgeon: Dalia Heading, MD;  Location: [facility];  Service: General;  Laterality: N/A;  . SHOULDER ARTHROSCOPY WITH OPEN ROTATOR CUFF REPAIR AND DISTAL CLAVICLE ACROMINECTOMY Left 11/02/2016   Procedure: LEFT SHOULDER ARTHROSCOPY WITH MINI OPEN ROTATOR CUFF REPAIR, OPEN BICEPS TENODESIS, AND DEBRIDEMENT;  Surgeon: Cammy Copa, MD;  Location: [facility] OR;  Service: Orthopedics;  Laterality: Left;  . SHOULDER ARTHROSCOPY WITH SUBACROMIAL DECOMPRESSION, ROTATOR CUFF REPAIR AND BICEP TENDON REPAIR Right 12/09/2015   Procedure: SHOULDER DIAGNOSTIC OPERATIVE ARTHROSCOPY, SUBACROMIAL DECOMPRESSION, DISTAL CLAVICLE EXCISION, MINI-OPEN ROTATOR CUFF REPAIR AND BICEP TENODESIS.;  Surgeon: Cammy Copa, MD;  Location: [facility] OR;  Service: Orthopedics;  Laterality: Right;  . widsom teeth  2017   Social History   Occupational History  . Not on file  Tobacco Use  . Smoking status: Never Smoker  . Smokeless tobacco: Never Used  Substance and Sexual Activity  . Alcohol use: No  . Drug use: No  . Sexual activity: Not on file

## 2017-01-12 ENCOUNTER — Encounter (HOSPITAL_COMMUNITY): Payer: Self-pay

## 2017-01-12 ENCOUNTER — Ambulatory Visit (HOSPITAL_COMMUNITY): Payer: 59

## 2017-01-12 DIAGNOSIS — R29898 Other symptoms and signs involving the musculoskeletal system: Secondary | ICD-10-CM

## 2017-01-12 DIAGNOSIS — M25512 Pain in left shoulder: Secondary | ICD-10-CM | POA: Diagnosis not present

## 2017-01-12 NOTE — Therapy (Signed)
[facility] [address] [place], [place], [address] Phone: [phone]   Fax:  [phone]  Occupational Therapy Treatment  Patient Details  Name: [name] Kelley MRN: [id number] Date of Birth: [birth year] Referring Provider: Dr. Cammy CopaGregory Scott Dean   Encounter Date: 01/12/2017  OT End of Session - 01/12/17 1256    Visit Number  6    Number of Visits  12    Date for OT Re-Evaluation  02/04/17 mini reassess on 01/18/17    Authorization Type  UMR    Authorization Time Period  24 visit limit    Authorization - Visit Number  6    Authorization - Number of Visits  24    OT Start Time  1120    OT Stop Time  1200    OT Time Calculation (min)  40 min    Activity Tolerance  Patient tolerated treatment well    Behavior During Therapy  [facility] for tasks assessed/performed       Past Medical History:  Diagnosis Date  . Medical history non-contributory     Past Surgical History:  Procedure Laterality Date  . Arm Manipulation Right 1995?  . INCISION AND DRAINAGE ABSCESS N/A 01/05/2013   Procedure: INCISION AND DRAINAGE PERIANAL  ABSCESS;  Surgeon: Dalia HeadingMark A Jenkins, MD;  Location: [facility];  Service: General;  Laterality: N/A;  . SHOULDER ARTHROSCOPY WITH OPEN ROTATOR CUFF REPAIR AND DISTAL CLAVICLE ACROMINECTOMY Left 11/02/2016   Procedure: LEFT SHOULDER ARTHROSCOPY WITH MINI OPEN ROTATOR CUFF REPAIR, OPEN BICEPS TENODESIS, AND DEBRIDEMENT;  Surgeon: Cammy Copaean, Gregory Scott, MD;  Location: [facility];  Service: Orthopedics;  Laterality: Left;  . SHOULDER ARTHROSCOPY WITH SUBACROMIAL DECOMPRESSION, ROTATOR CUFF REPAIR AND BICEP TENDON REPAIR Right 12/09/2015   Procedure: SHOULDER DIAGNOSTIC OPERATIVE ARTHROSCOPY, SUBACROMIAL DECOMPRESSION, DISTAL CLAVICLE EXCISION, MINI-OPEN ROTATOR CUFF REPAIR AND BICEP TENODESIS.;  Surgeon: Cammy CopaScott Gregory Dean, MD;  Location: [facility] OR;  Service: Orthopedics;  Laterality: Right;  . widsom teeth  2017    There were no vitals filed  for this visit.  Subjective Assessment - 01/12/17 1144    Subjective   S:     Currently in Pain?  No/denies mild stabbing         [facility] - 01/12/17 1150      Assessment   Diagnosis  s/p left mini-open RCR, debridement, SAD, biceps tendon release      Precautions   Precautions  Shoulder    Type of Shoulder Precautions  ROM and strengthening progress as tolerated               OT Treatments/Exercises (OP) - 01/12/17 0001      Exercises   Exercises  Shoulder      Shoulder Exercises: Standing   Protraction  Strengthening;12 reps    Protraction Weight (lbs)  2    Horizontal ABduction  AROM;12 reps    External Rotation  Strengthening;12 reps    External Rotation Weight (lbs)  2    Internal Rotation  Strengthening;12 reps    Internal Rotation Weight (lbs)  2    Flexion  Strengthening;12 reps    Shoulder Flexion Weight (lbs)  2    ABduction  AROM;12 reps      Shoulder Exercises: ROM/Strengthening   Proximal Shoulder Strengthening, Supine  12X with 2# no rest break    Proximal Shoulder Strengthening, Seated  12X no rest breaks    Ball on Wall  1' with arm flexed to 90 and  1' with arm abducted to 90 **patient requests abduction prior to flexion at next visit      Manual Therapy   Manual Therapy  Myofascial release    Manual therapy comments  Manual therapy completed prior to exercises.    Myofascial Release  Myofascial release and manaul  stretching completed to left upper arm, trapezius, and scapularis region to decrease fascial restrictions and increase joint mobility in a pain free zone.                OT Short Term Goals - 12/23/16 1057      OT SHORT TERM GOAL #1   Title  Pt will be provided with and independent in HEP to improve mobility required for ADL completion.     Time  3    Period  Weeks    Status  On-going      OT SHORT TERM GOAL #2   Title  Pt will improve LUE A/ROM to WNL to increase ability to reach behind back when bathing.      Time  6    Period  Weeks    Status  On-going      OT SHORT TERM GOAL #3   Title  Pt will decrease LUE fascial restrictions from mod to min amounts or less to improve mobility required for overhead reaching tasks.     Time  6    Period  Weeks    Status  On-going      OT SHORT TERM GOAL #4   Title  Pt will decrease pain in LUE to 2/10 or less to improve ability to perform ADLs using LUE as non-dominant.     Time  6    Period  Weeks    Status  On-going      OT SHORT TERM GOAL #5   Title  Pt will improve LUE strength to 5/5 to increase ability to drive motorcycle.     Time  6    Period  Weeks    Status  On-going      OT SHORT TERM GOAL #6   Title  Pt will return to highest level of functioning and independence in B/IADL completion using LUE as non-dominant.     Time  6    Period  Weeks    Status  On-going               Plan - 01/12/17 1257    Clinical Impression Statement  A: Progressed to 2# weight supine to focus on shoulder strengthening. Completed some strengthening standing except for horizontal abduction and abduction. MD requests that patient continue therapy for 6 more weeks then return.    Plan  P: Continue with strengthening and progress as patient is able to tolerate. Add hughston exercises: #2, #4, #6       Patient will benefit from skilled therapeutic intervention in order to improve the following deficits and impairments:  Impaired flexibility, Decreased strength, Decreased activity tolerance, Pain, Decreased range of motion, Increased fascial restricitons, Impaired UE functional use  Visit Diagnosis: Acute pain of left shoulder  Other symptoms and signs involving the musculoskeletal system    Problem List Patient Active Problem List   Diagnosis Date Noted  . Complete tear of right rotator cuff 02/05/2016  . Complete rotator cuff tear of left shoulder 02/05/2016    Limmie Patricia, OTR/L,CBIS  [phone]  01/12/2017, 1:30 PM  [facility] [address] [place], [place],  [address] Phone: [phone]   Fax:  [phone]  Name: [name] Kelley MRN: [id number] Date of Birth: 07/12/1985

## 2017-01-17 ENCOUNTER — Telehealth (HOSPITAL_COMMUNITY): Payer: Self-pay | Admitting: Specialist

## 2017-01-17 ENCOUNTER — Ambulatory Visit (HOSPITAL_COMMUNITY): Payer: 59 | Attending: Orthopedic Surgery | Admitting: Specialist

## 2017-01-17 ENCOUNTER — Encounter (HOSPITAL_COMMUNITY): Payer: Self-pay | Admitting: Specialist

## 2017-01-17 DIAGNOSIS — R29898 Other symptoms and signs involving the musculoskeletal system: Secondary | ICD-10-CM | POA: Diagnosis not present

## 2017-01-17 DIAGNOSIS — M25512 Pain in left shoulder: Secondary | ICD-10-CM | POA: Diagnosis not present

## 2017-01-17 NOTE — Therapy (Addendum)
[facility] [address] [place], [place], [address] Phone: [phone]   Fax:  [phone]  Occupational Therapy Treatment  Patient Details  Name: [name] Kelley MRN: [id number] Date of Birth: 1986-02-05 Referring Provider: Dr. Cammy Copa   Encounter Date: 01/17/2017    OT End of Session - 01/12/17 1256    Visit Number  7   Number of Visits  12    Date for OT Re-Evaluation  02/04/17 mini reassess on 01/18/17    Authorization Type  UMR    Authorization Time Period  24 visit limit    Authorization - Visit Number 7   Authorization - Number of Visits  24    OT Start Time  950   OT Stop Time  1030   OT Time Calculation (min)  40 min    Activity Tolerance  Patient tolerated treatment well    Behavior During Therapy  [facility] for tasks assessed/performed         Past Medical History:  Diagnosis Date  . Medical history non-contributory     Past Surgical History:  Procedure Laterality Date  . Arm Manipulation Right 1995?  . INCISION AND DRAINAGE ABSCESS N/A 01/05/2013   Procedure: INCISION AND DRAINAGE PERIANAL  ABSCESS;  Surgeon: Dalia Heading, MD;  Location: [facility];  Service: General;  Laterality: N/A;  . SHOULDER ARTHROSCOPY WITH OPEN ROTATOR CUFF REPAIR AND DISTAL CLAVICLE ACROMINECTOMY Left 11/02/2016   Procedure: LEFT SHOULDER ARTHROSCOPY WITH MINI OPEN ROTATOR CUFF REPAIR, OPEN BICEPS TENODESIS, AND DEBRIDEMENT;  Surgeon: Cammy Copa, MD;  Location: [facility];  Service: Orthopedics;  Laterality: Left;  . SHOULDER ARTHROSCOPY WITH SUBACROMIAL DECOMPRESSION, ROTATOR CUFF REPAIR AND BICEP TENDON REPAIR Right 12/09/2015   Procedure: SHOULDER DIAGNOSTIC OPERATIVE ARTHROSCOPY, SUBACROMIAL DECOMPRESSION, DISTAL CLAVICLE EXCISION, MINI-OPEN ROTATOR CUFF REPAIR AND BICEP TENODESIS.;  Surgeon: Cammy Copa, MD;  Location: [facility] OR;  Service: Orthopedics;  Laterality: Right;  . widsom teeth  2017    There were no  vitals filed for this visit.  Subjective Assessment - 01/17/17 0955    Subjective   S:  I have had the same pain going down my mid back and shoulder blade for the last 4 days     Currently in Pain?  Yes    Pain Score  4     Pain Location  Scapula    Pain Orientation  Left    Pain Descriptors / Indicators  Sharp    Pain Type  Acute pain    Pain Onset  In the past 7 days    Pain Frequency  Constant    Aggravating Factors   unsure    Pain Relieving Factors  nothing    Effect of Pain on Daily Activities  moderate          [facility] OT Assessment - 01/17/17 0001      Assessment   Diagnosis  s/p left mini-open RCR, debridement, SAD, biceps tendon release      Precautions   Precautions  Shoulder    Type of Shoulder Precautions  ROM and strengthening progress as tolerated               OT Treatments/Exercises (OP) - 01/17/17 0001      Shoulder Exercises: Supine   Protraction  PROM;5 reps    Horizontal ABduction  PROM;5 reps    External Rotation  PROM;5 reps    Internal Rotation  PROM;5 reps    Flexion  PROM;5 reps    ABduction  PROM;5 reps      Modalities   Modalities  Electrical Stimulation;Moist Heat      Moist Heat Therapy   Number Minutes Moist Heat  15 Minutes    Moist Heat Location  Shoulder      Electrical Stimulation   Electrical Stimulation Location  left scapula    Electrical Stimulation Action  sweeping     Electrical Stimulation Parameters  80/150 mhz 22 intensity    Electrical Stimulation Goals  Pain      Manual Therapy   Manual Therapy  Myofascial release    Manual therapy comments  Manual therapy completed prior to exercises.    Myofascial Release  Myofascial release and manaul  stretching completed to left upper arm, trapezius, and scapularis region to decrease fascial restrictions and increase joint mobility in a pain free zone.   Myofascial release and manual traction to cervical region and upper trapezius region to decrease pain and restrictions.                  OT Short Term Goals - 12/23/16 1057      OT SHORT TERM GOAL #1   Title  Pt will be provided with and independent in HEP to improve mobility required for ADL completion.     Time  3    Period  Weeks    Status  On-going      OT SHORT TERM GOAL #2   Title  Pt will improve LUE A/ROM to WNL to increase ability to reach behind back when bathing.     Time  6    Period  Weeks    Status  On-going      OT SHORT TERM GOAL #3   Title  Pt will decrease LUE fascial restrictions from mod to min amounts or less to improve mobility required for overhead reaching tasks.     Time  6    Period  Weeks    Status  On-going      OT SHORT TERM GOAL #4   Title  Pt will decrease pain in LUE to 2/10 or less to improve ability to perform ADLs using LUE as non-dominant.     Time  6    Period  Weeks    Status  On-going      OT SHORT TERM GOAL #5   Title  Pt will improve LUE strength to 5/5 to increase ability to drive motorcycle.     Time  6    Period  Weeks    Status  On-going      OT SHORT TERM GOAL #6   Title  Pt will return to highest level of functioning and independence in B/IADL completion using LUE as non-dominant.     Time  6    Period  Weeks    Status  On-going               Plan - 01/17/17 1029    Clinical Impression Statement  A:  Patient with increased pain x 4 days in medial scapular area, thus treatment focused on manual therapy and added interferential estim for pain control.      Plan  P:  Follow up on pain relief with estim, continue if needed, resume missed exercises and add hughston exercises 2, 4 6       Patient will benefit from skilled therapeutic intervention in order to improve the following deficits and impairments:  Impaired perceived  functional ability, Decreased strength, Impaired flexibility, Decreased range of motion, Pain, Impaired UE functional use, Increased fascial restrictions  Visit Diagnosis: Acute pain of left  shoulder  Other symptoms and signs involving the musculoskeletal system    Problem List Patient Active Problem List   Diagnosis Date Noted  . Complete tear of right rotator cuff 02/05/2016  . Complete rotator cuff tear of left shoulder 02/05/2016    Shirlean MylarBethany H. Murray, AlaskaMHA, OTR/L [phone]  01/17/2017, 10:32 AM  [facility] [address] [place], [place], [address] Phone: [phone]   Fax:  [phone]  Name: [name] Kelley MRN: [id number] Date of Birth: [birth year]

## 2017-01-17 NOTE — Telephone Encounter (Signed)
Patient is leaving town and will not be back until later that day.

## 2017-01-19 ENCOUNTER — Encounter (HOSPITAL_COMMUNITY): Payer: 59 | Admitting: Specialist

## 2017-01-24 ENCOUNTER — Ambulatory Visit (HOSPITAL_COMMUNITY): Payer: 59 | Admitting: Specialist

## 2017-01-25 ENCOUNTER — Telehealth (HOSPITAL_COMMUNITY): Payer: Self-pay

## 2017-01-25 NOTE — Telephone Encounter (Signed)
Reschedule for 01/26/17 per patient.

## 2017-01-26 ENCOUNTER — Ambulatory Visit (HOSPITAL_COMMUNITY): Payer: 59

## 2017-01-26 ENCOUNTER — Encounter (HOSPITAL_COMMUNITY): Payer: Self-pay

## 2017-01-26 DIAGNOSIS — R29898 Other symptoms and signs involving the musculoskeletal system: Secondary | ICD-10-CM

## 2017-01-26 DIAGNOSIS — M25512 Pain in left shoulder: Secondary | ICD-10-CM | POA: Diagnosis not present

## 2017-01-26 NOTE — Patient Instructions (Signed)
 (  Home) Extension: Isometric / Bilateral Arm Retraction - Sitting   Facing anchor, hold hands and elbow at shoulder height, with elbow bent.  Pull arms back to squeeze shoulder blades together. Repeat 10-15 times. 1-3 times/day.   (Clinic) Extension / Flexion (Assist)   Face anchor, pull arms back, keeping elbow straight, and squeze shoulder blades together. Repeat 10-15 times. 1-3 times/day.   ELASTIC BAND - HORIZONTAL ABDUCTION  Start by holding an elastic band or tubing with your arm out-stretched in front of you and across your body towards the opposite side.  Next, pull the elastic band or cord horizontally and outward as shown.   Your elbow should be straight or slightly bent the entire time.    Copyright  VHI. All rights reserved.   (Home) Retraction: Row - Bilateral (Anchor)   Facing anchor, arms reaching forward, pull hands toward stomach, keeping elbows bent and at your sides and pinching shoulder blades together. Repeat 10-15 times. 1-3 times/day.   Copyright  VHI. All rights reserved.    ELASTIC BAND SHOULDER EXTERNAL ROTATION - ER  While holding an elastic band at your side with your elbow bent, start with your hand near your stomach and then pull the band away. Keep your elbow at your side the entire time.  10-15 repetitions.

## 2017-01-26 NOTE — Therapy (Signed)
[facility] [address] [place], [place], [address] Phone: [phone]   Fax:  [phone]  Occupational Therapy Treatment  Patient Details  Name: [name] Kelley MRN: [id number] Date of Birth: 1985-11-30 Referring Provider (Historical): Dr. Cammy CopaGregory Scott Kelley   Encounter Date: 01/26/2017  OT End of Session - 01/26/17 0949    Visit Number  8    Number of Visits  12    Date for OT Re-Evaluation  02/04/17    Authorization Type  UMR    Authorization Time Period  24 visit limit    Authorization - Visit Number  8    Authorization - Number of Visits  24    OT Start Time  0900    OT Stop Time  0942    OT Time Calculation (min)  42 min    Activity Tolerance  Patient tolerated treatment well    Behavior During Therapy  [facility] for tasks assessed/performed       Past Medical History:  Diagnosis Date  . Medical history non-contributory     Past Surgical History:  Procedure Laterality Date  . Arm Manipulation Right 1995?  . INCISION AND DRAINAGE ABSCESS N/A 01/05/2013   Procedure: INCISION AND DRAINAGE PERIANAL  ABSCESS;  Surgeon: [name] HeadingMark A Jenkins, MD;  Location: [facility];  Service: General;  Laterality: N/A;  . SHOULDER ARTHROSCOPY WITH OPEN ROTATOR CUFF REPAIR AND DISTAL CLAVICLE ACROMINECTOMY Left 11/02/2016   Procedure: LEFT SHOULDER ARTHROSCOPY WITH MINI OPEN ROTATOR CUFF REPAIR, OPEN BICEPS TENODESIS, AND DEBRIDEMENT;  Surgeon: [name] Kelley, [name] Scott, MD;  Location: [facility];  Service: Orthopedics;  Laterality: Left;  . SHOULDER ARTHROSCOPY WITH SUBACROMIAL DECOMPRESSION, ROTATOR CUFF REPAIR AND BICEP TENDON REPAIR Right 12/09/2015   Procedure: SHOULDER DIAGNOSTIC OPERATIVE ARTHROSCOPY, SUBACROMIAL DECOMPRESSION, DISTAL CLAVICLE EXCISION, MINI-OPEN ROTATOR CUFF REPAIR AND BICEP TENODESIS.;  Surgeon: [name] CopaScott [name] Dean, MD;  Location: [facility] OR;  Service: Orthopedics;  Laterality: Right;  . widsom teeth  2017    There were no vitals filed for this  visit.  Subjective Assessment - 01/26/17 0921    Subjective   S: I'm not having as much pain as I was last time.    Currently in Pain?  Yes    Pain Score  2     Pain Location  Scapula    Pain Orientation  Left    Pain Descriptors / Indicators  Sharp    Pain Type  Acute pain    Pain Onset  In the past 7 days    Pain Frequency  Occasional every day    Aggravating Factors   No reason    Pain Relieving Factors  nothing    Effect of Pain on Daily Activities  moderate    Multiple Pain Sites  No         [facility] OT Assessment - 01/26/17 0923      Assessment   Medical Diagnosis  S/P left mini-open RC, debridement, SAD, bicep tendon release    Referring Provider  Dr. Cammy CopaGregory Scott Kelley    Onset Date/Surgical Date  11/02/16      Precautions   Precautions  Shoulder    Type of Shoulder Precautions  ROM and strengthening progress as tolerated               OT Treatments/Exercises (OP) - 01/26/17 0925      Exercises   Exercises  Shoulder      Shoulder Exercises: Supine   Protraction  PROM;5 reps  Horizontal ABduction  PROM;5 reps    External Rotation  PROM;5 reps    Internal Rotation  PROM;5 reps    Flexion  PROM;5 reps    ABduction  PROM;5 reps      Shoulder Exercises: Prone   Other Prone Exercises  Hughston exercises; A/ROM; 10X, H1, H3, H4      Shoulder Exercises: Sidelying   External Rotation  Strengthening;12 reps    External Rotation Weight (lbs)  2    Internal Rotation  Strengthening;12 reps    Internal Rotation Weight (lbs)  2    Flexion  Strengthening;12 reps    Flexion Weight (lbs)  2    ABduction  Strengthening;12 reps    ABduction Weight (lbs)  2      Shoulder Exercises: Standing   Horizontal ABduction  Theraband;15 reps    Theraband Level (Shoulder Horizontal ABduction)  Level 2 (Red)    External Rotation  Theraband;15 reps    Theraband Level (Shoulder External Rotation)  Level 2 (Red)    Extension  Theraband;15 reps    Theraband Level (Shoulder  Extension)  Level 2 (Red)    Row  Theraband;15 reps    Theraband Level (Shoulder Row)  Level 2 (Red)    Retraction  Theraband;15 reps    Theraband Level (Shoulder Retraction)  Level 2 (Red)      Manual Therapy   Manual Therapy  Myofascial release    Manual therapy comments  Manual therapy completed prior to exercises.    Myofascial Release  Myofascial release and manaul  stretching completed to left upper arm, trapezius, and scapularis region to decrease fascial restrictions and increase joint mobility in a pain free zone.   Myofascial release and manual traction to cervical region and upper trapezius region to decrease pain and restrictions.               OT Education - 01/26/17 [phone]    Education provided  Yes    Education Details  red/green band scapular and shoulder strengthening exercises.    Person(s) Educated  Patient    Methods  Explanation;Demonstration;Verbal cues;Handout    Comprehension  Returned demonstration;Verbalized understanding       OT Short Term Goals - 12/23/16 1057      OT SHORT TERM GOAL #1   Title  Pt will be provided with and independent in HEP to improve mobility required for ADL completion.     Time  3    Period  Weeks    Status  On-going      OT SHORT TERM GOAL #2   Title  Pt will improve LUE A/ROM to WNL to increase ability to reach behind back when bathing.     Time  6    Period  Weeks    Status  On-going      OT SHORT TERM GOAL #3   Title  Pt will decrease LUE fascial restrictions from mod to min amounts or less to improve mobility required for overhead reaching tasks.     Time  6    Period  Weeks    Status  On-going      OT SHORT TERM GOAL #4   Title  Pt will decrease pain in LUE to 2/10 or less to improve ability to perform ADLs using LUE as non-dominant.     Time  6    Period  Weeks    Status  On-going      OT SHORT TERM GOAL #5  Title  Pt will improve LUE strength to 5/5 to increase ability to drive motorcycle.     Time  6     Period  Weeks    Status  On-going      OT SHORT TERM GOAL #6   Title  Pt will return to highest level of functioning and independence in B/IADL completion using LUE as non-dominant.     Time  6    Period  Weeks    Status  On-going               Plan - 01/26/17 0949    Clinical Impression Statement  A: Patient with significant popping in left shoulder during the descent of shoulder flexion (extension). Added prone hughston exercises and sidelying strengthening to focus on scapular strength and stability. VC for form and technique.     Plan  P: Continue with scapular and shoulder strengthening.       Patient will benefit from skilled therapeutic intervention in order to improve the following deficits and impairments:  Impaired perceived functional ability, Decreased strength, Impaired flexibility, Decreased range of motion, Pain, Impaired UE functional use, Increased fascial restrictions  Visit Diagnosis: Other symptoms and signs involving the musculoskeletal system  Acute pain of left shoulder    Problem List Patient Active Problem List   Diagnosis Date Noted  . Complete tear of right rotator cuff 02/05/2016  . Complete rotator cuff tear of left shoulder 02/05/2016   Limmie PatriciaLaura Essenmacher, OTR/L,CBIS  [phone]  01/26/2017, 9:52 AM  [facility] [address] [place], [place], [address] Phone: [phone]   Fax:  [phone]  Name: [name] CablesDennis J Mccathern MRN: [id number] Date of Birth: 27-Apr-1985

## 2017-01-27 ENCOUNTER — Ambulatory Visit (HOSPITAL_COMMUNITY): Payer: 59

## 2017-01-27 ENCOUNTER — Encounter (HOSPITAL_COMMUNITY): Payer: Self-pay

## 2017-01-27 DIAGNOSIS — M25512 Pain in left shoulder: Secondary | ICD-10-CM | POA: Diagnosis not present

## 2017-01-27 DIAGNOSIS — R29898 Other symptoms and signs involving the musculoskeletal system: Secondary | ICD-10-CM

## 2017-01-27 NOTE — Therapy (Signed)
[facility] [address] [place], [place], [address] Phone: [phone]   Fax:  [phone]  Occupational Therapy Treatment  Patient Details  Name: [name] Kelley MRN: [id number] Date of Birth: [birth year] Referring Provider (Historical): Dr. Cammy CopaGregory Scott Dean   Encounter Date: 01/27/2017  OT End of Session - 01/27/17 1148    Visit Number  9    Number of Visits  12    Date for OT Re-Evaluation  02/04/17    Authorization Type  UMR    Authorization Time Period  24 visit limit    Authorization - Visit Number  9    Authorization - Number of Visits  24    OT Start Time  [phone]    OT Stop Time  0945    OT Time Calculation (min)  40 min    Activity Tolerance  Patient tolerated treatment well    Behavior During Therapy  [facility] for tasks assessed/performed       Past Medical History:  Diagnosis Date  . Medical history non-contributory     Past Surgical History:  Procedure Laterality Date  . Arm Manipulation Right 1995?  . INCISION AND DRAINAGE ABSCESS N/A 01/05/2013   Procedure: INCISION AND DRAINAGE PERIANAL  ABSCESS;  Surgeon: Dalia HeadingMark A Jenkins, MD;  Location: [facility];  Service: General;  Laterality: N/A;  . SHOULDER ARTHROSCOPY WITH OPEN ROTATOR CUFF REPAIR AND DISTAL CLAVICLE ACROMINECTOMY Left 11/02/2016   Procedure: LEFT SHOULDER ARTHROSCOPY WITH MINI OPEN ROTATOR CUFF REPAIR, OPEN BICEPS TENODESIS, AND DEBRIDEMENT;  Surgeon: Cammy Copaean, Gregory Scott, MD;  [facility];  Service: Orthopedics;  Laterality: Left;  . SHOULDER ARTHROSCOPY WITH SUBACROMIAL DECOMPRESSION, ROTATOR CUFF REPAIR AND BICEP TENDON REPAIR Right 12/09/2015   Procedure: SHOULDER DIAGNOSTIC OPERATIVE ARTHROSCOPY, SUBACROMIAL DECOMPRESSION, DISTAL CLAVICLE EXCISION, MINI-OPEN ROTATOR CUFF REPAIR AND BICEP TENODESIS.;  Surgeon: Cammy CopaScott Gregory Dean, MD;  Location: [facility];  Service: Orthopedics;  Laterality: Right;  . widsom teeth  2017    There were no vitals filed for this  visit.  Subjective Assessment - 01/27/17 0927    Subjective   S: I was feeling it yesterday. It feels ok now.     Currently in Pain?  No/denies         [facility] - 01/27/17 0923      Assessment   Medical Diagnosis  S/P left mini-open RC, debridement, SAD, bicep tendon release      Precautions   Precautions  Shoulder    Type of Shoulder Precautions  ROM and strengthening progress as tolerated               OT Treatments/Exercises (OP) - 01/27/17 0923      Exercises   Exercises  Shoulder      Shoulder Exercises: Supine   Protraction  PROM;5 reps    Horizontal ABduction  PROM;5 reps    External Rotation  PROM;5 reps    Internal Rotation  PROM;5 reps    Flexion  PROM;5 reps    ABduction  PROM;5 reps      Shoulder Exercises: Prone   Other Prone Exercises  Hughston exercises; A/ROM; 10X, H1, H3, H4      Shoulder Exercises: Sidelying   External Rotation  Strengthening;12 reps    External Rotation Weight (lbs)  2    Internal Rotation  Strengthening;12 reps    Internal Rotation Weight (lbs)  2    Flexion  Strengthening;12 reps    Flexion Weight (lbs)  2  ABduction  Strengthening;12 reps    ABduction Weight (lbs)  2    Other Sidelying Exercises  Protraction; 12X; 2#    Other Sidelying Exercises  horizontal abduction; 12X; 2#      Shoulder Exercises: Standing   Horizontal ABduction  Theraband;15 reps    Theraband Level (Shoulder Horizontal ABduction)  Level 2 (Red)    External Rotation  Theraband;15 reps    Theraband Level (Shoulder External Rotation)  Level 2 (Red)    Flexion  Theraband;12 reps    Theraband Level (Shoulder Flexion)  Level 2 (Red)    ABduction  Theraband;15 reps    Theraband Level (Shoulder ABduction)  Level 2 (Red)    Extension  Theraband;15 reps    Theraband Level (Shoulder Extension)  Level 3 (Green)    Row  Constellation Energyheraband;15 reps    Theraband Level (Shoulder Row)  Level 3 (Green)    Retraction  Theraband;15 reps    Theraband Level  (Shoulder Retraction)  Level 2 (Red)      Shoulder Exercises: ROM/Strengthening   Rebounder  4 minutes reverse  1.0    X to V Arms  10X with 2#      Manual Therapy   Manual Therapy  Myofascial release    Manual therapy comments  Manual therapy completed prior to exercises.    Myofascial Release  Myofascial release and manaul  stretching completed to left upper arm, trapezius, and scapularis region to decrease fascial restrictions and increase joint mobility in a pain free zone.   Myofascial release and manual traction to cervical region and upper trapezius region to decrease pain and restrictions.               OT Education - 01/26/17 [phone]    Education provided  Yes    Education Details  red/green band scapular and shoulder strengthening exercises.    Person(s) Educated  Patient    Methods  Explanation;Demonstration;Verbal cues;Handout    Comprehension  Returned demonstration;Verbalized understanding       OT Short Term Goals - 12/23/16 1057      OT SHORT TERM GOAL #1   Title  Pt will be provided with and independent in HEP to improve mobility required for ADL completion.     Time  3    Period  Weeks    Status  On-going      OT SHORT TERM GOAL #2   Title  Pt will improve LUE A/ROM to WNL to increase ability to reach behind back when bathing.     Time  6    Period  Weeks    Status  On-going      OT SHORT TERM GOAL #3   Title  Pt will decrease LUE fascial restrictions from mod to min amounts or less to improve mobility required for overhead reaching tasks.     Time  6    Period  Weeks    Status  On-going      OT SHORT TERM GOAL #4   Title  Pt will decrease pain in LUE to 2/10 or less to improve ability to perform ADLs using LUE as non-dominant.     Time  6    Period  Weeks    Status  On-going      OT SHORT TERM GOAL #5   Title  Pt will improve LUE strength to 5/5 to increase ability to drive motorcycle.     Time  6    Period  Weeks  Status  On-going      OT  SHORT TERM GOAL #6   Title  Pt will return to highest level of functioning and independence in B/IADL completion using LUE as non-dominant.     Time  6    Period  Weeks    Status  On-going               Plan - 01/27/17 1149    Clinical Impression Statement  A: Continued to focus on shoulder and scapular strengthening and stability. VC for form form and technique.    Plan  P: Continue with scapular and shoulder strengthening.       Patient will benefit from skilled therapeutic intervention in order to improve the following deficits and impairments:  Impaired perceived functional ability, Decreased strength, Impaired flexibility, Decreased range of motion, Pain, Impaired UE functional use, Increased fascial restrictions  Visit Diagnosis: Other symptoms and signs involving the musculoskeletal system  Acute pain of left shoulder    Problem List Patient Active Problem List   Diagnosis Date Noted  . Complete tear of right rotator cuff 02/05/2016  . Complete rotator cuff tear of left shoulder 02/05/2016   Limmie Patricia, OTR/L,CBIS  [phone]  01/27/2017, 11:51 AM  [facility] [address] [place], [place], [address] Phone: [phone]   Fax:  [phone]  Name: [name] Kelley MRN: [id number] Date of Birth: 1985/04/21

## 2017-01-31 ENCOUNTER — Ambulatory Visit (HOSPITAL_COMMUNITY): Payer: 59

## 2017-01-31 ENCOUNTER — Encounter (HOSPITAL_COMMUNITY): Payer: Self-pay

## 2017-01-31 DIAGNOSIS — M25512 Pain in left shoulder: Secondary | ICD-10-CM

## 2017-01-31 DIAGNOSIS — R29898 Other symptoms and signs involving the musculoskeletal system: Secondary | ICD-10-CM | POA: Diagnosis not present

## 2017-01-31 NOTE — Therapy (Signed)
[facility] [address] [place], [place], [address] Phone: [phone]   Fax:  [phone]  Occupational Therapy Treatment  Patient Details  Name: [name] Kelley MRN: [id number] Date of Birth: Mar 26, 1985 Referring Provider (Historical): Dr. Cammy Copa   Encounter Date: 01/31/2017  OT End of Session - 01/31/17 0934    Visit Number  10    Number of Visits  12    Date for OT Re-Evaluation  02/04/17    Authorization Type  UMR    Authorization Time Period  24 visit limit    Authorization - Visit Number  10    Authorization - Number of Visits  24    OT Start Time  [phone]    OT Stop Time  0945    OT Time Calculation (min)  40 min    Activity Tolerance  Patient tolerated treatment well    Behavior During Therapy  [facility] for tasks assessed/performed       Past Medical History:  Diagnosis Date  . Medical history non-contributory     Past Surgical History:  Procedure Laterality Date  . Arm Manipulation Right 1995?  . INCISION AND DRAINAGE ABSCESS N/A 01/05/2013   Procedure: INCISION AND DRAINAGE PERIANAL  ABSCESS;  Surgeon: Dalia Heading, MD;  Location: [facility];  Service: General;  Laterality: N/A;  . SHOULDER ARTHROSCOPY WITH OPEN ROTATOR CUFF REPAIR AND DISTAL CLAVICLE ACROMINECTOMY Left 11/02/2016   Procedure: LEFT SHOULDER ARTHROSCOPY WITH MINI OPEN ROTATOR CUFF REPAIR, OPEN BICEPS TENODESIS, AND DEBRIDEMENT;  Surgeon: Cammy Copa, MD;  [facility]: [facility];  Service: Orthopedics;  Laterality: Left;  . SHOULDER ARTHROSCOPY WITH SUBACROMIAL DECOMPRESSION, ROTATOR CUFF REPAIR AND BICEP TENDON REPAIR Right 12/09/2015   Procedure: SHOULDER DIAGNOSTIC OPERATIVE ARTHROSCOPY, SUBACROMIAL DECOMPRESSION, DISTAL CLAVICLE EXCISION, MINI-OPEN ROTATOR CUFF REPAIR AND BICEP TENODESIS.;  Surgeon: Cammy Copa, MD;  Location: [facility];  Service: Orthopedics;  Laterality: Right;  . widsom teeth  2017    There were no vitals filed for this  visit.  Subjective Assessment - 01/31/17 0928    Subjective   S: Yesterday i had this stabbing feeling right in my shoulder.     Currently in Pain?  Yes    Pain Score  2     Pain Location  Shoulder    Pain Orientation  Left    Pain Descriptors / Indicators  Dull    Pain Type  Acute pain         [facility] OT Assessment - 01/31/17 0929      Assessment   Medical Diagnosis  S/P left mini-open RC, debridement, SAD, bicep tendon release      Precautions   Precautions  Shoulder    Type of Shoulder Precautions  ROM and strengthening progress as tolerated               OT Treatments/Exercises (OP) - 01/31/17 0929      Exercises   Exercises  Shoulder      Shoulder Exercises: Supine   Protraction  PROM;5 reps    Horizontal ABduction  PROM;5 reps    External Rotation  PROM;5 reps    Internal Rotation  PROM;5 reps    Flexion  PROM;5 reps    ABduction  PROM;5 reps      Shoulder Exercises: Prone   Other Prone Exercises  Hughston exercises; 1#; 10X, H1, H3, H4      Shoulder Exercises: Sidelying   External Rotation  Strengthening;15 reps  return to highest level of functioning and independence in B/IADL completion using LUE as non-dominant.     Time  6    Period  Weeks    Status  On-going                Plan - 01/31/17 0935    Clinical Impression Statement  A: Continued to focus on shoulder and scapular strengthening and stability. VC for form form and technique. Increased repetitions with sidelying.    Plan  P: Reassessment. Determine if it is time for discharge with HEP. Complete FOTO.     Consulted and Agree with Plan of Care  Patient       Patient will benefit from skilled therapeutic intervention in order to improve the following deficits and impairments:  Impaired perceived functional ability, Decreased strength, Impaired flexibility, Decreased range of motion, Pain, Impaired UE functional use, Increased fascial restrictions  Visit Diagnosis: Other symptoms and signs involving the musculoskeletal system  Acute pain of left shoulder    Problem List Patient Active Problem List   Diagnosis Date Noted  . Complete tear of right rotator cuff 02/05/2016  . Complete rotator cuff tear of left shoulder 02/05/2016   Limmie PatriciaLaura Adrean Heitz, OTR/L,CBIS  [phone]  01/31/2017, 11:35 AM  [facility] [address] [place], [place], [address] Phone: [phone]   Fax:  [phone]  Name: [name] Kelley MRN: [id number] Date of Birth: 17-Aug-1985  [facility] [address] [place], [place], [address] Phone: [phone]   Fax:  [phone]  Occupational Therapy Treatment  Patient Details  Name: [name] Kelley MRN: [id number] Date of Birth: Mar 26, 1985 Referring Provider (Historical): Dr. Cammy Copa   Encounter Date: 01/31/2017  OT End of Session - 01/31/17 0934    Visit Number  10    Number of Visits  12    Date for OT Re-Evaluation  02/04/17    Authorization Type  UMR    Authorization Time Period  24 visit limit    Authorization - Visit Number  10    Authorization - Number of Visits  24    OT Start Time  [phone]    OT Stop Time  0945    OT Time Calculation (min)  40 min    Activity Tolerance  Patient tolerated treatment well    Behavior During Therapy  [facility] for tasks assessed/performed       Past Medical History:  Diagnosis Date  . Medical history non-contributory     Past Surgical History:  Procedure Laterality Date  . Arm Manipulation Right 1995?  . INCISION AND DRAINAGE ABSCESS N/A 01/05/2013   Procedure: INCISION AND DRAINAGE PERIANAL  ABSCESS;  Surgeon: Dalia Heading, MD;  Location: [facility];  Service: General;  Laterality: N/A;  . SHOULDER ARTHROSCOPY WITH OPEN ROTATOR CUFF REPAIR AND DISTAL CLAVICLE ACROMINECTOMY Left 11/02/2016   Procedure: LEFT SHOULDER ARTHROSCOPY WITH MINI OPEN ROTATOR CUFF REPAIR, OPEN BICEPS TENODESIS, AND DEBRIDEMENT;  Surgeon: Cammy Copa, MD;  [facility];  Service: Orthopedics;  Laterality: Left;  . SHOULDER ARTHROSCOPY WITH SUBACROMIAL DECOMPRESSION, ROTATOR CUFF REPAIR AND BICEP TENDON REPAIR Right 12/09/2015   Procedure: SHOULDER DIAGNOSTIC OPERATIVE ARTHROSCOPY, SUBACROMIAL DECOMPRESSION, DISTAL CLAVICLE EXCISION, MINI-OPEN ROTATOR CUFF REPAIR AND BICEP TENODESIS.;  Surgeon: Cammy Copa, MD;  [facility]: [facility];  Service: Orthopedics;  Laterality: Right;  . widsom teeth  2017    There were no vitals filed for this  visit.  Subjective Assessment - 01/31/17 0928    Subjective   S: Yesterday i had this stabbing feeling right in my shoulder.     Currently in Pain?  Yes    Pain Score  2     Pain Location  Shoulder    Pain Orientation  Left    Pain Descriptors / Indicators  Dull    Pain Type  Acute pain         [facility] OT Assessment - 01/31/17 0929      Assessment   Medical Diagnosis  S/P left mini-open RC, debridement, SAD, bicep tendon release      Precautions   Precautions  Shoulder    Type of Shoulder Precautions  ROM and strengthening progress as tolerated               OT Treatments/Exercises (OP) - 01/31/17 0929      Exercises   Exercises  Shoulder      Shoulder Exercises: Supine   Protraction  PROM;5 reps    Horizontal ABduction  PROM;5 reps    External Rotation  PROM;5 reps    Internal Rotation  PROM;5 reps    Flexion  PROM;5 reps    ABduction  PROM;5 reps      Shoulder Exercises: Prone   Other Prone Exercises  Hughston exercises; 1#; 10X, H1, H3, H4      Shoulder Exercises: Sidelying   External Rotation  Strengthening;15 reps

## 2017-02-03 ENCOUNTER — Encounter (HOSPITAL_COMMUNITY): Payer: Self-pay | Admitting: Occupational Therapy

## 2017-02-03 ENCOUNTER — Ambulatory Visit (HOSPITAL_COMMUNITY): Payer: 59 | Admitting: Occupational Therapy

## 2017-02-03 ENCOUNTER — Telehealth (INDEPENDENT_AMBULATORY_CARE_PROVIDER_SITE_OTHER): Payer: Self-pay | Admitting: Orthopedic Surgery

## 2017-02-03 ENCOUNTER — Other Ambulatory Visit: Payer: Self-pay

## 2017-02-03 DIAGNOSIS — R29898 Other symptoms and signs involving the musculoskeletal system: Secondary | ICD-10-CM | POA: Diagnosis not present

## 2017-02-03 DIAGNOSIS — M25512 Pain in left shoulder: Secondary | ICD-10-CM

## 2017-02-03 NOTE — Therapy (Signed)
[facility] [address] [place], [place], [address] Phone: [phone]   Fax:  [phone]  Occupational Therapy Reassessment and Treatment  Patient Details  Name: [name] Kelley MRN: [id number] Date of Birth: December 09, 1985 Referring Provider (Historical): Dr. Meredith Pel   Encounter Date: 02/03/2017  OT End of Session - 02/03/17 1205    Visit Number  11    Number of Visits  15    Date for OT Re-Evaluation  03/05/17    Authorization Type  UMR    Authorization Time Period  24 visit limit    Authorization - Visit Number  11    Authorization - Number of Visits  24    OT Start Time  [phone]    OT Stop Time  1033    OT Time Calculation (min)  46 min    Activity Tolerance  Patient tolerated treatment well    Behavior During Therapy  [facility] for tasks assessed/performed       Past Medical History:  Diagnosis Date  . Medical history non-contributory     Past Surgical History:  Procedure Laterality Date  . Arm Manipulation Right 1995?  . INCISION AND DRAINAGE ABSCESS N/A 01/05/2013   Procedure: INCISION AND DRAINAGE PERIANAL  ABSCESS;  Surgeon: Jamesetta So, MD;  Location: [facility];  Service: General;  Laterality: N/A;  . SHOULDER ARTHROSCOPY WITH OPEN ROTATOR CUFF REPAIR AND DISTAL CLAVICLE ACROMINECTOMY Left 11/02/2016   Procedure: LEFT SHOULDER ARTHROSCOPY WITH MINI OPEN ROTATOR CUFF REPAIR, OPEN BICEPS TENODESIS, AND DEBRIDEMENT;  Surgeon: Meredith Pel, MD;  Location: [place];  Service: Orthopedics;  Laterality: Left;  . SHOULDER ARTHROSCOPY WITH SUBACROMIAL DECOMPRESSION, ROTATOR CUFF REPAIR AND BICEP TENDON REPAIR Right 12/09/2015   Procedure: SHOULDER DIAGNOSTIC OPERATIVE ARTHROSCOPY, SUBACROMIAL DECOMPRESSION, DISTAL CLAVICLE EXCISION, MINI-OPEN ROTATOR CUFF REPAIR AND BICEP TENODESIS.;  Surgeon: Meredith Pel, MD;  Location: [place];  Service: Orthopedics;  Laterality: Right;  . widsom teeth  2017    There were no vitals  filed for this visit.  Subjective Assessment - 02/03/17 0947    Subjective   S: I still can't sleep on it.     Currently in Pain?  Yes    Pain Score  1     Pain Location  Shoulder    Pain Orientation  Left    Pain Descriptors / Indicators  Dull    Pain Type  Acute pain    Pain Radiating Towards  none    Pain Onset  1 to 4 weeks ago    Pain Frequency  Occasional every day    Aggravating Factors   no specific reason    Pain Relieving Factors  occasional pain medication (on really bad days)    Effect of Pain on Daily Activities  moderate    Multiple Pain Sites  No         [facility] OT Assessment - 02/03/17 0947      Assessment   Medical Diagnosis  S/P left mini-open RC, debridement, SAD, bicep tendon release      Precautions   Precautions  Shoulder    Type of Shoulder Precautions  ROM and strengthening progress as tolerated      AROM   Overall AROM Comments  Assessed seated, er/IR adducted    AROM Assessment Site  Shoulder    Right/Left Shoulder  Left    Left Shoulder Flexion  170 Degrees 116 previous    Left Shoulder ABduction  180 Degrees  87 previous    Left Shoulder Internal Rotation  90 Degrees same as previous    Left Shoulder External Rotation  48 Degrees same as previous      PROM   Overall PROM   Within functional limits for tasks performed      Strength   Overall Strength Comments  Assessed seated, er/IR adducted    Strength Assessment Site  Shoulder    Right/Left Shoulder  Left    Left Shoulder Flexion  4/5 3/5 previous    Left Shoulder ABduction  4-/5 3-/5 previous    Left Shoulder Internal Rotation  4+/5 3/5 previous    Left Shoulder External Rotation  4/5 3/5 previous               OT Treatments/Exercises (OP) - 02/03/17 0954      Exercises   Exercises  Shoulder      Shoulder Exercises: Prone   Other Prone Exercises  Hughston exercises; 1#; 10X, H1, H3, H4      Shoulder Exercises: Standing   Protraction  Theraband;15 reps    Theraband Level  (Shoulder Protraction)  Level 2 (Red)    Horizontal ABduction  Theraband;15 reps    Theraband Level (Shoulder Horizontal ABduction)  Level 2 (Red)    External Rotation  Theraband;15 reps    Theraband Level (Shoulder External Rotation)  Level 2 (Red)    Flexion  Theraband;12 reps    Theraband Level (Shoulder Flexion)  Level 2 (Red)    ABduction  Theraband;10 reps    Theraband Level (Shoulder ABduction)  Level 2 (Red)      Shoulder Exercises: ROM/Strengthening   Ball on Wall  1' abduction and 1' flexion (abduction completed first)    Other ROM/Strengthening Exercises  arms on fire, 4 positions 15 seconds each, 2' total               OT Short Term Goals - 02/03/17 1002      OT SHORT TERM GOAL #1   Title  Pt will be provided with and independent in HEP to improve mobility required for ADL completion.     Time  3    Period  Weeks    Status  On-going      OT SHORT TERM GOAL #2   Title  Pt will improve LUE A/ROM to WNL to increase ability to reach behind back when bathing.     Time  6    Period  Weeks    Status  Achieved      OT SHORT TERM GOAL #3   Title  Pt will decrease LUE fascial restrictions from mod to min amounts or less to improve mobility required for overhead reaching tasks.     Time  6    Period  Weeks    Status  Achieved      OT SHORT TERM GOAL #4   Title  Pt will decrease pain in LUE to 2/10 or less to improve ability to perform ADLs using LUE as non-dominant.     Time  6    Period  Weeks    Status  Partially Met      OT SHORT TERM GOAL #5   Title  Pt will improve LUE strength to 5/5 to increase ability to drive motorcycle.     Time  6    Period  Weeks    Status  On-going      OT SHORT TERM GOAL #6   Title  Pt  will return to highest level of functioning and independence in B/IADL completion using LUE as non-dominant.     Time  6    Period  Weeks    Status  On-going               Plan - 02/03/17 1205    Clinical Impression Statement  A:  Reassessment completed today, pt has met 2/6 goals, partially met an additional goal, and is making progress towards remaining strength and functional use goals. Pt continues to have pain varying from 2/10 ot 8/10, no specific movement or reason for the increases in pain. Discussed progress and expected outcomes with pt, noting expected discomfort and limitations due to irrepairable supraspinatus tendon. Pt is agreeable to continue therapy for 4 additional weeks at 1x/week incorporating HEP as well, to focus on strengthening required for functional task completion. FOTO not completed as pt is not set up in system.     Occupational Profile and client history currently impacting functional performance  Pt is independent and is motivated to return to work    Occupational performance deficits (Please refer to evaluation for details):  ADL's;IADL's;Rest and Sleep;Work;Leisure    Rehab Potential  Good    OT Frequency  1x / week    OT Duration  4 weeks    OT Treatment/Interventions  Self-care/ADL training;Ultrasound;Patient/family education;Passive range of motion;Cryotherapy;Electrical Stimulation;Therapeutic activities;Manual Therapy;Therapeutic exercise;Moist Heat    Plan  P: Continue therapy 1x/week for 4 additional weeks focusing on functional strengthening and activity tolerance. Next session: continue with strengthening, add overhead lacing with wrist weight. Update HEP    OT Home Exercise Plan  11/6: shoulder stretches    Consulted and Agree with Plan of Care  Patient       Patient will benefit from skilled therapeutic intervention in order to improve the following deficits and impairments:  Impaired perceived functional ability, Decreased strength, Impaired flexibility, Decreased range of motion, Pain, Impaired UE functional use, Increased fascial restrictions  Visit Diagnosis: Other symptoms and signs involving the musculoskeletal system  Acute pain of left shoulder    Problem List Patient  Active Problem List   Diagnosis Date Noted  . Complete tear of right rotator cuff 02/05/2016  . Complete rotator cuff tear of left shoulder 02/05/2016   Guadelupe Sabin, OTR/L  [phone] 02/03/2017, 12:11 PM  [place], [place], [address] Phone: [phone]   Fax:  [phone]  Name: [name] Kelley MRN: [id number] Date of Birth: May 24, 1985

## 2017-02-03 NOTE — Telephone Encounter (Signed)
Patient would like to be put on a wait list to be seen before the beginning of the year due to insurance reasons. He would also like a call back he had a question for you. CB # [phone]

## 2017-02-04 NOTE — Telephone Encounter (Signed)
IC s/w patient and advised unfortunately we do not have cancellation list. He has appt for jan 9

## 2017-02-07 ENCOUNTER — Encounter (HOSPITAL_COMMUNITY): Payer: 59 | Admitting: Specialist

## 2017-02-09 ENCOUNTER — Encounter (HOSPITAL_COMMUNITY): Payer: Self-pay | Admitting: Occupational Therapy

## 2017-02-09 ENCOUNTER — Ambulatory Visit (HOSPITAL_COMMUNITY): Payer: 59 | Admitting: Occupational Therapy

## 2017-02-09 ENCOUNTER — Other Ambulatory Visit: Payer: Self-pay

## 2017-02-09 DIAGNOSIS — M25512 Pain in left shoulder: Secondary | ICD-10-CM

## 2017-02-09 DIAGNOSIS — R29898 Other symptoms and signs involving the musculoskeletal system: Secondary | ICD-10-CM | POA: Diagnosis not present

## 2017-02-09 NOTE — Patient Instructions (Signed)
Side Lying Exercises: Complete 15X each, 1-2x/day using 2.5lb weight.    1) Sidelying Flexion:   Lie on your side with your affected arm up. Start with the weight in your top hand by your side. Lift the arm forward and pull your shoulder blade down as the arm lifts up (like a seesaw).      2) Sidelying abduction:   Lie on your side with your arm down straight at your side.  Raise the arm up and overhead, keeping the elbow straight.  You may turn the palm forward and inward if you can.     3) Sidelying horizontal abduction:   Lie on your side with arm straight up towards ceiling. Lower the arm straight out to face the wall and bring back up towards ceiling.     4) Sidelying internal/external rotation:   Lie on side with elbow bent. Lower and raise forearm in direction of the ceiling, keeping elbow bent and by the side.

## 2017-02-09 NOTE — Therapy (Signed)
[place], [place], [address] Phone: [phone]   Fax:  [phone]  Occupational Therapy Treatment  Patient Details  Name: [name] Kelley MRN: [id number] Date of Birth: [birth year] Referring Provider (Historical): Dr. Meredith Pel   Encounter Date: 02/09/2017  OT End of Session - 02/09/17 1211    Visit Number  12    Number of Visits  15    Date for OT Re-Evaluation  03/05/17    Authorization Type  UMR    Authorization Time Period  24 visit limit    Authorization - Visit Number  12    Authorization - Number of Visits  24    OT Start Time  3662    OT Stop Time  1117    OT Time Calculation (min)  44 min    Activity Tolerance  Patient tolerated treatment well    Behavior During Therapy  [facility] for tasks assessed/performed       Past Medical History:  Diagnosis Date  . Medical history non-contributory     Past Surgical History:  Procedure Laterality Date  . Arm Manipulation Right 1995?  . INCISION AND DRAINAGE ABSCESS N/A 01/05/2013   Procedure: INCISION AND DRAINAGE PERIANAL  ABSCESS;  Surgeon: Jamesetta So, MD;  Location: [facility];  Service: General;  Laterality: N/A;  . SHOULDER ARTHROSCOPY WITH OPEN ROTATOR CUFF REPAIR AND DISTAL CLAVICLE ACROMINECTOMY Left 11/02/2016   Procedure: LEFT SHOULDER ARTHROSCOPY WITH MINI OPEN ROTATOR CUFF REPAIR, OPEN BICEPS TENODESIS, AND DEBRIDEMENT;  Surgeon: Meredith Pel, MD;  Location: [place];  Service: Orthopedics;  Laterality: Left;  . SHOULDER ARTHROSCOPY WITH SUBACROMIAL DECOMPRESSION, ROTATOR CUFF REPAIR AND BICEP TENDON REPAIR Right 12/09/2015   Procedure: SHOULDER DIAGNOSTIC OPERATIVE ARTHROSCOPY, SUBACROMIAL DECOMPRESSION, DISTAL CLAVICLE EXCISION, MINI-OPEN ROTATOR CUFF REPAIR AND BICEP TENODESIS.;  Surgeon: Meredith Pel, MD;  Location: [place];  Service: Orthopedics;  Laterality: Right;  . widsom teeth  2017    There were no vitals filed for this  visit.  Subjective Assessment - 02/09/17 1034    Subjective   S: It feels unstable sometimes like when I'm getting up from the floor.     Currently in Pain?  No/denies         [facility] OT Assessment - 02/09/17 1033      Assessment   Medical Diagnosis  S/P left mini-open RC, debridement, SAD, bicep tendon release      Precautions   Precautions  Shoulder    Type of Shoulder Precautions  ROM and strengthening progress as tolerated               OT Treatments/Exercises (OP) - 02/09/17 1035      Exercises   Exercises  Shoulder      Shoulder Exercises: Supine   Protraction  PROM;5 reps    Horizontal ABduction  PROM;5 reps    External Rotation  PROM;5 reps    Internal Rotation  PROM;5 reps    Flexion  PROM;5 reps    ABduction  PROM;5 reps      Shoulder Exercises: Prone   Other Prone Exercises  Hughston exercises; 2#; 15X, H1, H3, H4; forward elevation x2 positions, 15X, 2# weight    Other Prone Exercises  face down field goal, 10X, 2#      Shoulder Exercises: Sidelying   External Rotation  Strengthening;15 reps    External Rotation Weight (lbs)  3    Internal Rotation  Strengthening;15 reps  Internal Rotation Weight (lbs)  3    Flexion  Strengthening;15 reps    Flexion Weight (lbs)  3    Flexion Limitations  1 rest break    ABduction  Strengthening;15 reps    ABduction Weight (lbs)  3    Other Sidelying Exercises  Protraction; 15X; 3#    Other Sidelying Exercises  horizontal abduction; 15X; 3#      Shoulder Exercises: Standing   Other Standing Exercises  Medium sized green ball: chest press, arm circles 2 directions, flexion, 20X each, 1# wrist weight      Shoulder Exercises: ROM/Strengthening   X to V Arms  15X with 2#    Ball on Wall  1' abduction and 1' flexion (abduction completed first)    Other ROM/Strengthening Exercises  straight arm push ups on mat table at approximately 3 feet             OT Education - 02/09/17 1103    Education provided   Yes    Education Details  sidelying exercises    Person(s) Educated  Patient    Methods  Explanation;Demonstration;Handout    Comprehension  Verbalized understanding;Returned demonstration       OT Short Term Goals - 02/03/17 1002      OT SHORT TERM GOAL #1   Title  Pt will be provided with and independent in HEP to improve mobility required for ADL completion.     Time  3    Period  Weeks    Status  On-going      OT SHORT TERM GOAL #2   Title  Pt will improve LUE A/ROM to WNL to increase ability to reach behind back when bathing.     Time  6    Period  Weeks    Status  Achieved      OT SHORT TERM GOAL #3   Title  Pt will decrease LUE fascial restrictions from mod to min amounts or less to improve mobility required for overhead reaching tasks.     Time  6    Period  Weeks    Status  Achieved      OT SHORT TERM GOAL #4   Title  Pt will decrease pain in LUE to 2/10 or less to improve ability to perform ADLs using LUE as non-dominant.     Time  6    Period  Weeks    Status  Partially Met      OT SHORT TERM GOAL #5   Title  Pt will improve LUE strength to 5/5 to increase ability to drive motorcycle.     Time  6    Period  Weeks    Status  On-going      OT SHORT TERM GOAL #6   Title  Pt will return to highest level of functioning and independence in B/IADL completion using LUE as non-dominant.     Time  6    Period  Weeks    Status  On-going               Plan - 02/09/17 1211    Clinical Impression Statement  A: Session focusing on strengthening and shoulder stability. Added therapy ball exercises, straight arm push-ups, face down field goal, and increased sidelying weight to 3#. Pt requiring occasional verbal cuing for initiating new exercises, rest breaks provided as needed for fatigue. Updated HEP and reviewed with pt.     Plan  P: Continue working towards improved strength  and shoulder stabilization. Add overhead lacing with weight, attempt plank. Follow up on  HEP.        Patient will benefit from skilled therapeutic intervention in order to improve the following deficits and impairments:  Impaired perceived functional ability, Decreased strength, Impaired flexibility, Decreased range of motion, Pain, Impaired UE functional use, Increased fascial restrictions  Visit Diagnosis: Other symptoms and signs involving the musculoskeletal system  Acute pain of left shoulder    Problem List Patient Active Problem List   Diagnosis Date Noted  . Complete tear of right rotator cuff 02/05/2016  . Complete rotator cuff tear of left shoulder 02/05/2016   Guadelupe Sabin, OTR/L  [phone] 02/09/2017, 12:15 PM  [place], [place], [address] Phone: [phone]   Fax:  [phone]  Name: AMEYA KUTZ MRN: [id number] Date of Birth: 1985-07-17

## 2017-02-17 ENCOUNTER — Encounter (HOSPITAL_COMMUNITY): Payer: Self-pay | Admitting: Occupational Therapy

## 2017-02-17 ENCOUNTER — Ambulatory Visit (HOSPITAL_COMMUNITY): Payer: 59 | Attending: Orthopedic Surgery | Admitting: Occupational Therapy

## 2017-02-17 ENCOUNTER — Other Ambulatory Visit: Payer: Self-pay

## 2017-02-17 DIAGNOSIS — M25512 Pain in left shoulder: Secondary | ICD-10-CM | POA: Insufficient documentation

## 2017-02-17 DIAGNOSIS — R29898 Other symptoms and signs involving the musculoskeletal system: Secondary | ICD-10-CM | POA: Diagnosis not present

## 2017-02-17 NOTE — Patient Instructions (Signed)
Shoulder Stabilization Exercises  1) Wall slide: Place an elastic band around your arms at the level of your wrists as shown. Next, place your forearms and hands along a wall so that your elbows are bent and your arms point towards the ceiling.  Then, protract your shoulder blades forward and then slide your arms up the wall as shown.   2) Lateral Wall Walks: With hands against wall, walk or slide your hands to the side against resistance of the band.   3) Upward/Diagonal Wall Walks: Walk or slide your hand up the wall in a diagonal direction, going against the resistance of the band.  4) Modified Plank Plus: Perform a plank on your knees and elbows as shown and sustain the hold. While holding, protract your shoulder blades forward to raise up a few more inches and then return to original position.  5) Straight arm push-ups: Start in a push up position on your hands and leaning up against a table or counter top as shown. Maintain this position as you protract your shoulder blades forward to raise your body upward a few inches. Then, return to original position.                    progressing to-

## 2017-02-17 NOTE — Therapy (Signed)
[facility] [place], [place], [address] Phone: [phone]   Fax:  [phone]  Occupational Therapy Treatment  Patient Details  Name: [name] Kelley MRN: [id number] Date of Birth: 11-Dec-1985 Referring Provider (Historical): Dr. Meredith Pel   Encounter Date: 02/17/2017  OT End of Session - 02/17/17 1449    Visit Number  13    Number of Visits  15    Date for OT Re-Evaluation  03/05/17    Authorization Type  UMR    Authorization Time Period  24 visit limit    Authorization - Visit Number  13    Authorization - Number of Visits  24    OT Start Time  0086    OT Stop Time  1441    OT Time Calculation (min)  46 min    Activity Tolerance  Patient tolerated treatment well    Behavior During Therapy  [facility] for tasks assessed/performed       Past Medical History:  Diagnosis Date  . Medical history non-contributory     Past Surgical History:  Procedure Laterality Date  . Arm Manipulation Right 1995?  . INCISION AND DRAINAGE ABSCESS N/A 01/05/2013   Procedure: INCISION AND DRAINAGE PERIANAL  ABSCESS;  Surgeon: Jamesetta So, MD;  Location: [facility];  Service: General;  Laterality: N/A;  . SHOULDER ARTHROSCOPY WITH OPEN ROTATOR CUFF REPAIR AND DISTAL CLAVICLE ACROMINECTOMY Left 11/02/2016   Procedure: LEFT SHOULDER ARTHROSCOPY WITH MINI OPEN ROTATOR CUFF REPAIR, OPEN BICEPS TENODESIS, AND DEBRIDEMENT;  Surgeon: Meredith Pel, MD;  Location: [place];  Service: Orthopedics;  Laterality: Left;  . SHOULDER ARTHROSCOPY WITH SUBACROMIAL DECOMPRESSION, ROTATOR CUFF REPAIR AND BICEP TENDON REPAIR Right 12/09/2015   Procedure: SHOULDER DIAGNOSTIC OPERATIVE ARTHROSCOPY, SUBACROMIAL DECOMPRESSION, DISTAL CLAVICLE EXCISION, MINI-OPEN ROTATOR CUFF REPAIR AND BICEP TENODESIS.;  Surgeon: Meredith Pel, MD;  Location: [place];  Service: Orthopedics;  Laterality: Right;  . widsom teeth  2017    There were no vitals filed for this  visit.  Subjective Assessment - 02/17/17 1357    Subjective   S: I have trouble turning the steering wheel to the right.     Currently in Pain?  No/denies         [facility] OT Assessment - 02/17/17 1356      Assessment   Medical Diagnosis  S/P left mini-open RC, debridement, SAD, bicep tendon release      Precautions   Precautions  Shoulder    Type of Shoulder Precautions  ROM and strengthening progress as tolerated               OT Treatments/Exercises (OP) - 02/17/17 1357      Exercises   Exercises  Shoulder      Shoulder Exercises: Supine   External Rotation  PROM;5 reps    Internal Rotation  PROM;5 reps    Flexion  PROM;5 reps    Other Supine Exercises  Earthquake bar-5# each side, 15X      Shoulder Exercises: Prone   Other Prone Exercises  Hughston exercises; 3#; 15X, H1, H3, H4; forward elevation x2 positions, 15X, 3# weight    Other Prone Exercises  face down field goal, 10X, 3#      Shoulder Exercises: Sidelying   External Rotation  Strengthening;15 reps    External Rotation Weight (lbs)  3    Internal Rotation  Strengthening;15 reps    Internal Rotation Weight (lbs)  3    Flexion  Strengthening;15 reps    Flexion Weight (lbs)  3    ABduction  Strengthening;15 reps    ABduction Weight (lbs)  3    Other Sidelying Exercises  Protraction; 15X; 3#    Other Sidelying Exercises  horizontal abduction; 15X; 3#      Shoulder Exercises: Standing   Other Standing Exercises  With red theraband: wall slides in flexion, lateral wall walks, diagonal wall walks, 15X each    Other Standing Exercises  Medium sized green ball: chest press, arm circles 2 directions, flexion, 20X each, 2# wrist weight      Shoulder Exercises: ROM/Strengthening   Ball on Wall  1' abduction and 1' flexion with green ball (abduction completed first)    Other ROM/Strengthening Exercises  straight arm push ups on mat table at approximately 3 feet, 15X; modified plank plus-10X    Other  ROM/Strengthening Exercises  overhead carry, 10# weight, 3x down and back             OT Education - 02/17/17 1452    Education provided  Yes    Education Details  shoulder stabilization exercises    Person(s) Educated  Patient    Methods  Explanation;Demonstration;Handout    Comprehension  Verbalized understanding;Returned demonstration       OT Short Term Goals - 02/03/17 1002      OT SHORT TERM GOAL #1   Title  Pt will be provided with and independent in HEP to improve mobility required for ADL completion.     Time  3    Period  Weeks    Status  On-going      OT SHORT TERM GOAL #2   Title  Pt will improve LUE A/ROM to WNL to increase ability to reach behind back when bathing.     Time  6    Period  Weeks    Status  Achieved      OT SHORT TERM GOAL #3   Title  Pt will decrease LUE fascial restrictions from mod to min amounts or less to improve mobility required for overhead reaching tasks.     Time  6    Period  Weeks    Status  Achieved      OT SHORT TERM GOAL #4   Title  Pt will decrease pain in LUE to 2/10 or less to improve ability to perform ADLs using LUE as non-dominant.     Time  6    Period  Weeks    Status  Partially Met      OT SHORT TERM GOAL #5   Title  Pt will improve LUE strength to 5/5 to increase ability to drive motorcycle.     Time  6    Period  Weeks    Status  On-going      OT SHORT TERM GOAL #6   Title  Pt will return to highest level of functioning and independence in B/IADL completion using LUE as non-dominant.     Time  6    Period  Weeks    Status  On-going               Plan - 02/17/17 1449    Clinical Impression Statement  A: Session focusing on strengthening and shoulder stabilization, added earthquake bar, red theraband scapular exercises, and modified plank for scapular strengthening. Pt requiring occasional verbal cuing for form and rest breaks for fatigue. Pt reports min muscle fatigue at end of session, no  increase  in pain.     Plan  P: Continue working towards improved strengthening and shoulder stabilization an updating HEP. Add shoulder endurance tasks and strengthening horizontal abduction. Follow up on HEP    Consulted and Agree with Plan of Care  Patient       Patient will benefit from skilled therapeutic intervention in order to improve the following deficits and impairments:  Impaired perceived functional ability, Decreased strength, Impaired flexibility, Decreased range of motion, Pain, Impaired UE functional use, Increased fascial restrictions  Visit Diagnosis: Other symptoms and signs involving the musculoskeletal system  Acute pain of left shoulder    Problem List Patient Active Problem List   Diagnosis Date Noted  . Complete tear of right rotator cuff 02/05/2016  . Complete rotator cuff tear of left shoulder 02/05/2016   Guadelupe Sabin, OTR/L  [phone] 02/17/2017, 2:53 PM  [place], [place], [address] Phone: [phone]   Fax:  [phone]  Name: [name] Kelley MRN: [id number] Date of Birth: 1985/05/14

## 2017-02-23 ENCOUNTER — Other Ambulatory Visit: Payer: Self-pay

## 2017-02-23 ENCOUNTER — Ambulatory Visit (HOSPITAL_COMMUNITY): Payer: 59 | Admitting: Occupational Therapy

## 2017-02-23 ENCOUNTER — Encounter (HOSPITAL_COMMUNITY): Payer: Self-pay | Admitting: Occupational Therapy

## 2017-02-23 DIAGNOSIS — M25512 Pain in left shoulder: Secondary | ICD-10-CM | POA: Diagnosis not present

## 2017-02-23 DIAGNOSIS — R29898 Other symptoms and signs involving the musculoskeletal system: Secondary | ICD-10-CM

## 2017-02-23 NOTE — Patient Instructions (Signed)
1) PNF Extension with band While holding an elastic band across the upper half of your body, pull the band downward and across towards your other side. Your hand should start in the thumb-up position and end in the thumb-down position.    2) External Rotation/Flexion with band Start by holding an elastic band or sports cord with your arm up at 90 degrees flexed forward and elbow bent at 90 degrees. Your forearm should be directed towards the side in the beginning position as shown. Next, bring your forearm upward so that it points towards the ceiling as shown.     3) Alternating Shoulder Taps Place hands on an appropriate height such as a counter or for more advanced, a coffee table. Alternate shoulder taps without rotating trunk     4) Ball Flips:  Lying face down with your elbow straight and holding a small ball, raise your arm upward and towards the ceiling. Release the ball in the air and then catch the ball as you quickly bring your arms downward and back to the original position. OR can be completed in standing.      5) Shoulder ABCs While standing and holding a small ball or free weight, write out the alphabet in the air with your arm. Only your arm should be moving as you perform this.

## 2017-02-23 NOTE — Therapy (Addendum)
[place], [place], [address] Phone: [phone]   Fax:  [phone]  Occupational Therapy Treatment  Patient Details  Name: [name] Kelley MRN: [id number] Date of Birth: January 31, 1986 Referring Provider (Historical): Dr. Meredith Pel   Encounter Date: 02/23/2017  OT End of Session - 02/23/17 1224    Visit Number  14    Number of Visits  15    Date for OT Re-Evaluation  03/05/17    Authorization Type  UMR    Authorization Time Period  24 visit limit    Authorization - Visit Number  14    Authorization - Number of Visits  24    OT Start Time  6378    OT Stop Time  1115    OT Time Calculation (min)  40 min    Activity Tolerance  Patient tolerated treatment well    Behavior During Therapy  [facility] for tasks assessed/performed       Past Medical History:  Diagnosis Date  . Medical history non-contributory     Past Surgical History:  Procedure Laterality Date  . Arm Manipulation Right 1995?  . INCISION AND DRAINAGE ABSCESS N/A 01/05/2013   Procedure: INCISION AND DRAINAGE PERIANAL  ABSCESS;  Surgeon: Jamesetta So, MD;  Location: [facility];  Service: General;  Laterality: N/A;  . SHOULDER ARTHROSCOPY WITH OPEN ROTATOR CUFF REPAIR AND DISTAL CLAVICLE ACROMINECTOMY Left 11/02/2016   Procedure: LEFT SHOULDER ARTHROSCOPY WITH MINI OPEN ROTATOR CUFF REPAIR, OPEN BICEPS TENODESIS, AND DEBRIDEMENT;  Surgeon: Meredith Pel, MD;  Location: [place];  Service: Orthopedics;  Laterality: Left;  . SHOULDER ARTHROSCOPY WITH SUBACROMIAL DECOMPRESSION, ROTATOR CUFF REPAIR AND BICEP TENDON REPAIR Right 12/09/2015   Procedure: SHOULDER DIAGNOSTIC OPERATIVE ARTHROSCOPY, SUBACROMIAL DECOMPRESSION, DISTAL CLAVICLE EXCISION, MINI-OPEN ROTATOR CUFF REPAIR AND BICEP TENODESIS.;  Surgeon: Meredith Pel, MD;  Location: [place];  Service: Orthopedics;  Laterality: Right;  . widsom teeth  2017    There were no vitals filed for this  visit.  Subjective Assessment - 02/23/17 1040    Subjective   S: I was really sore last Thursday and Friday.     Currently in Pain?  No/denies         [facility] OT Assessment - 02/23/17 1218      Assessment   Medical Diagnosis  S/P left mini-open RC, debridement, SAD, bicep tendon release      Precautions   Precautions  Shoulder    Type of Shoulder Precautions  ROM and strengthening progress as tolerated               OT Treatments/Exercises (OP) - 02/23/17 1045      Exercises   Exercises  Shoulder      Shoulder Exercises: Supine   Protraction  PROM;5 reps    Horizontal ABduction  PROM;5 reps    External Rotation  PROM;5 reps    Internal Rotation  PROM;5 reps    Flexion  PROM;5 reps    Other Supine Exercises  Earthquake bar-5# dumbbell and 2# wrist weight (on PVC bar) each side, 15X      Shoulder Exercises: Prone   Other Prone Exercises  Hughston exercises; 3#; 15X, H1, H3, H4    Other Prone Exercises  prone ball flips using green weighted ball, 10X      Shoulder Exercises: Standing   Other Standing Exercises  Red theraband: PNF D1 extension, er/flexion 10X each    Other Standing Exercises  Ball drops using green weighted ball with shoulder in abduction at shoulder height, 10X      Shoulder Exercises: ROM/Strengthening   Other ROM/Strengthening Exercises  Alternating shoulder taps, on mat table at 2.5 foot height.     Other ROM/Strengthening Exercises  overhead carry, 10# weight, 3x down and back 2 hallways, 1 rest breaks with elbow bent and weight resting at shoulder height      Modalities   Modalities  Electrical Stimulation      Electrical Stimulation   Electrical Stimulation Location  left shoulder    Electrical Stimulation Action  interferential     Electrical Stimulation Parameters  22CV    Electrical Stimulation Goals  Pain             OT Education - 02/23/17 1234    Education provided  Yes    Education Details  shoulder stabilization II  exercises. Educated pt on alternating exericses and not completing all at one time. Also educated on taking a rest day to give his arm/shoulder a break. Educated on heating pad use    Person(s) Educated  Patient    Methods  Explanation;Demonstration;Handout    Comprehension  Verbalized understanding;Returned demonstration       OT Short Term Goals - 02/03/17 1002      OT SHORT TERM GOAL #1   Title  Pt will be provided with and independent in HEP to improve mobility required for ADL completion.     Time  3    Period  Weeks    Status  On-going      OT SHORT TERM GOAL #2   Title  Pt will improve LUE A/ROM to WNL to increase ability to reach behind back when bathing.     Time  6    Period  Weeks    Status  Achieved      OT SHORT TERM GOAL #3   Title  Pt will decrease LUE fascial restrictions from mod to min amounts or less to improve mobility required for overhead reaching tasks.     Time  6    Period  Weeks    Status  Achieved      OT SHORT TERM GOAL #4   Title  Pt will decrease pain in LUE to 2/10 or less to improve ability to perform ADLs using LUE as non-dominant.     Time  6    Period  Weeks    Status  Partially Met      OT SHORT TERM GOAL #5   Title  Pt will improve LUE strength to 5/5 to increase ability to drive motorcycle.     Time  6    Period  Weeks    Status  On-going      OT SHORT TERM GOAL #6   Title  Pt will return to highest level of functioning and independence in B/IADL completion using LUE as non-dominant.     Time  6    Period  Weeks    Status  On-going               Plan - 02/23/17 1224    Clinical Impression Statement  A: Pt reporting soreness and tenderness to the touch after previous session x2 days, then occasional random stabbing pain, no specific exercise on the HEP reproducing this pain per pt report. Pt has no pain today at beginning of session, continued with shoulder stabilization adding ball drops/flips in standing and prone, PNF  extension  and er/flexion with red theraband, alternating shoulder taps at raised mat table, and continued with earthquake bar, prone hughston exercises, and overhead carry. Did not complete exercises with therapy ball as suspect this potentially caused his increased pain in combination with new exercises during previous session. Pt does very well with new exercises, initally cuing for form, no reports of increased pain, only muscle fatigue. ES completed after session to relax arm and decrease soreness. Provided additional shoulder stabilization exercises and educated on spreading exercises out versus completing all at once and on taking a rest day.     Plan  P: Follow up on MD appt and HEP. Reassess and determine if ready for discharge.        Patient will benefit from skilled therapeutic intervention in order to improve the following deficits and impairments:  Impaired perceived functional ability, Decreased strength, Impaired flexibility, Decreased range of motion, Pain, Impaired UE functional use, Increased fascial restrictions  Visit Diagnosis: Other symptoms and signs involving the musculoskeletal system  Acute pain of left shoulder    Problem List Patient Active Problem List   Diagnosis Date Noted  . Complete tear of right rotator cuff 02/05/2016  . Complete rotator cuff tear of left shoulder 02/05/2016   Guadelupe Sabin, OTR/L  [phone] 02/23/2017, 12:35 PM  [place], [place], [address] Phone: [phone]   Fax:  [phone]  Name: [name] Kelley MRN: [id number] Date of Birth: [birth year]

## 2017-02-24 ENCOUNTER — Ambulatory Visit (INDEPENDENT_AMBULATORY_CARE_PROVIDER_SITE_OTHER): Payer: 59 | Admitting: Orthopedic Surgery

## 2017-02-24 ENCOUNTER — Ambulatory Visit (INDEPENDENT_AMBULATORY_CARE_PROVIDER_SITE_OTHER): Payer: 59

## 2017-02-24 ENCOUNTER — Encounter (INDEPENDENT_AMBULATORY_CARE_PROVIDER_SITE_OTHER): Payer: Self-pay | Admitting: Orthopedic Surgery

## 2017-02-24 DIAGNOSIS — M5412 Radiculopathy, cervical region: Secondary | ICD-10-CM

## 2017-02-24 DIAGNOSIS — M1812 Unilateral primary osteoarthritis of first carpometacarpal joint, left hand: Secondary | ICD-10-CM

## 2017-02-24 DIAGNOSIS — M79602 Pain in left arm: Secondary | ICD-10-CM

## 2017-02-24 DIAGNOSIS — M75122 Complete rotator cuff tear or rupture of left shoulder, not specified as traumatic: Secondary | ICD-10-CM | POA: Diagnosis not present

## 2017-02-25 ENCOUNTER — Encounter (INDEPENDENT_AMBULATORY_CARE_PROVIDER_SITE_OTHER): Payer: Self-pay | Admitting: Orthopedic Surgery

## 2017-02-25 NOTE — Progress Notes (Signed)
Office Visit Note   Patient: [name] Kelley           Date of Birth: 01-Feb-1986           MRN: [id number] Visit Date: 02/24/2017 Requested by: Elfredia NevinsFusco, [place], MD [address] [place], [place] [id number] PCP: Elfredia NevinsFusco, [place], MD  Subjective: Chief Complaint  Patient presents with  . Left Shoulder - Follow-up    HPI: [name] Kelley is a patient who presents with multiple complaints today.  He underwent left shoulder arthroscopy and mini open rotator cuff repair September 18.  The rotator cuff tear was not repairable.  He still has some limitations and is doing therapy 2 times a week plus a home exercise program.  He takes oxycodone as needed.  States that his shoulder is better than it was.  He does report some limited range of motion.  He cannot sleep on that side.  He is not working.  He also reports neck pain "24/7" he also describes left thumb pain with pinching.  He describes pain radiating into the shoulder blade on the left-hand side.  This is very debilitating for him.  This is been going on for several months on the left.              ROS: All systems reviewed are negative as they relate to the chief complaint within the history of present illness.  Patient denies  fevers or chills.   Assessment & Plan: Visit Diagnoses:  1. Left arm pain   2. Complete rotator cuff tear of left shoulder   3. Arthritis of carpometacarpal (CMC) joint of left thumb   4. Radiculopathy, cervical region     Plan: Impression is left shoulder pain with irreparable supraspinatus tendon tear and some functional limitation at this time.  He is plateauing in physical therapy.  I think he is going to need to have superior capsular reconstruction performed at some time in the future.  In regards to his thumb he has positive grind test on the left and likely has some degree of CMC arthritis present there.  He does have pain with pinching in that area.  I think the patient may also have some cervical  radiculopathy.  He is having pain in the shoulder blade region which radiates down the posterior aspect of his arm.  The bothering him constant basis and is been ongoing for several months.  Plan for this is MRI scan of the cervical spine with likely ESI to follow.  Decrease physical therapy to 1 time a week only plus a home exercise program.  Follow-up after the scan.  Follow-Up Instructions: Return for after MRI.   Orders:  Orders Placed This Encounter  Procedures  . XR Cervical Spine 2 or 3 views  . MR Cervical Spine w/o contrast   No orders of the defined types were placed in this encounter.     Procedures: No procedures performed   Clinical Data: No additional findings.  Objective: Vital Signs: There were no vitals taken for this visit.  Physical Exam:   Constitutional: Patient appears well-developed HEENT:  Head: Normocephalic Eyes:EOM are normal Neck: Normal range of motion Cardiovascular: Normal rate Pulmonary/chest: Effort normal Neurologic: Patient is alert Skin: Skin is warm Psychiatric: Patient has normal mood and affect    Ortho Exam: Orthopedic exam demonstrates flexion and abduction above 90 degrees on that left shoulder but he does have some weakness with supraspinatus testing as expected.  There is some coarseness with passive range  of motion of the left shoulder.  Radial pulses intact.  He has positive grind test on the left and pain with pitching on the left.  EPL FPL function is intact.  Radial pulses intact.  Reflexes symmetric bilateral biceps and triceps.  Neck range of motion is full but there is some pain with rotation to the left.  No scapular dyskinesia or crepitus with protraction of the scapula.  Specialty Comments:  No specialty comments available.  Imaging: No results found.   PMFS History: Patient Active Problem List   Diagnosis Date Noted  . Complete tear of right rotator cuff 02/05/2016  . Complete rotator cuff tear of left  shoulder 02/05/2016   Past Medical History:  Diagnosis Date  . Medical history non-contributory     History reviewed. No pertinent family history.  Past Surgical History:  Procedure Laterality Date  . Arm Manipulation Right 1995?  . INCISION AND DRAINAGE ABSCESS N/A 01/05/2013   Procedure: INCISION AND DRAINAGE PERIANAL  ABSCESS;  Surgeon: Dalia Heading, MD;  Location: [facility];  Service: General;  Laterality: N/A;  . SHOULDER ARTHROSCOPY WITH OPEN ROTATOR CUFF REPAIR AND DISTAL CLAVICLE ACROMINECTOMY Left 11/02/2016   Procedure: LEFT SHOULDER ARTHROSCOPY WITH MINI OPEN ROTATOR CUFF REPAIR, OPEN BICEPS TENODESIS, AND DEBRIDEMENT;  Surgeon: Cammy Copa, MD;  Location: [facility];  Service: Orthopedics;  Laterality: Left;  . SHOULDER ARTHROSCOPY WITH SUBACROMIAL DECOMPRESSION, ROTATOR CUFF REPAIR AND BICEP TENDON REPAIR Right 12/09/2015   Procedure: SHOULDER DIAGNOSTIC OPERATIVE ARTHROSCOPY, SUBACROMIAL DECOMPRESSION, DISTAL CLAVICLE EXCISION, MINI-OPEN ROTATOR CUFF REPAIR AND BICEP TENODESIS.;  Surgeon: Cammy Copa, MD;  Location: [facility];  Service: Orthopedics;  Laterality: Right;  . widsom teeth  2017   Social History   Occupational History  . Not on file  Tobacco Use  . Smoking status: Never Smoker  . Smokeless tobacco: Never Used  Substance and Sexual Activity  . Alcohol use: No  . Drug use: No  . Sexual activity: Not on file

## 2017-03-04 ENCOUNTER — Telehealth (HOSPITAL_COMMUNITY): Payer: Self-pay | Admitting: Internal Medicine

## 2017-03-04 ENCOUNTER — Ambulatory Visit (HOSPITAL_COMMUNITY): Payer: 59 | Admitting: Occupational Therapy

## 2017-03-04 NOTE — Telephone Encounter (Signed)
03/04/17  cx because he said the stomach bug was in the house

## 2017-03-04 NOTE — Telephone Encounter (Signed)
03/04/17  Pt called and cx because everyone in the house has the stomach bug.  Verlon AuLeslie asked that we reschedule the appt and make it with her.  I called patient back and left a message asking him to call when he could and we would reschedule him.

## 2017-03-09 ENCOUNTER — Ambulatory Visit (HOSPITAL_COMMUNITY): Payer: 59 | Admitting: Occupational Therapy

## 2017-03-09 ENCOUNTER — Encounter (HOSPITAL_COMMUNITY): Payer: Self-pay | Admitting: Occupational Therapy

## 2017-03-09 DIAGNOSIS — R29898 Other symptoms and signs involving the musculoskeletal system: Secondary | ICD-10-CM | POA: Diagnosis not present

## 2017-03-09 DIAGNOSIS — M25512 Pain in left shoulder: Secondary | ICD-10-CM | POA: Diagnosis not present

## 2017-03-09 NOTE — Therapy (Signed)
[place], [place], [address] Phone: [phone]   Fax:  [phone]  Occupational Therapy Reassessment, Treatment, and Discharge  Patient Details  Name: [name] Kelley MRN: [id number] Date of Birth: [birth year] Referring Provider: Dr. Meredith Pel   Encounter Date: 03/09/2017  OT End of Session - 03/09/17 1753    Visit Number  15    Number of Visits  15    Date for OT Re-Evaluation  03/05/17    Authorization Type  [facility]    Authorization Time Period  24 visit limit    Authorization - Visit Number  15    Authorization - Number of Visits  24    OT Start Time  9373    OT Stop Time  1648    OT Time Calculation (min)  56 min    Activity Tolerance  Patient tolerated treatment well    Behavior During Therapy  [facility] for tasks assessed/performed       Past Medical History:  Diagnosis Date  . Medical history non-contributory     Past Surgical History:  Procedure Laterality Date  . Arm Manipulation Right 1995?  . INCISION AND DRAINAGE ABSCESS N/A 01/05/2013   Procedure: INCISION AND DRAINAGE PERIANAL  ABSCESS;  Surgeon: Jamesetta So, MD;  Location: [facility];  Service: General;  Laterality: N/A;  . SHOULDER ARTHROSCOPY WITH OPEN ROTATOR CUFF REPAIR AND DISTAL CLAVICLE ACROMINECTOMY Left 11/02/2016   Procedure: LEFT SHOULDER ARTHROSCOPY WITH MINI OPEN ROTATOR CUFF REPAIR, OPEN BICEPS TENODESIS, AND DEBRIDEMENT;  Surgeon: Meredith Pel, MD;  Location: [place];  Service: Orthopedics;  Laterality: Left;  . SHOULDER ARTHROSCOPY WITH SUBACROMIAL DECOMPRESSION, ROTATOR CUFF REPAIR AND BICEP TENDON REPAIR Right 12/09/2015   Procedure: SHOULDER DIAGNOSTIC OPERATIVE ARTHROSCOPY, SUBACROMIAL DECOMPRESSION, DISTAL CLAVICLE EXCISION, MINI-OPEN ROTATOR CUFF REPAIR AND BICEP TENODESIS.;  Surgeon: Meredith Pel, MD;  Location: [place];  Service: Orthopedics;  Laterality: Right;  . widsom teeth  2017    There were no vitals  filed for this visit.  Subjective Assessment - 03/09/17 1551    Subjective   S: I have a constant dull ache along my neck and back of my shoulder blade.     Currently in Pain?  No/denies         [facility] OT Assessment - 03/09/17 1551      Assessment   Medical Diagnosis  S/P left mini-open RC, debridement, SAD, bicep tendon release      Precautions   Precautions  Shoulder    Type of Shoulder Precautions  ROM and strengthening progress as tolerated      AROM   Overall AROM   Within functional limits for tasks performed    Overall AROM Comments  Assessed seated, er/IR adducted    AROM Assessment Site  Shoulder    Right/Left Shoulder  Left    Left Shoulder Flexion  175 Degrees 170 previous    Left Shoulder ABduction  180 Degrees same as previous    Left Shoulder Internal Rotation  90 Degrees same as previous    Left Shoulder External Rotation  55 Degrees 48 previous      PROM   Overall PROM   Within functional limits for tasks performed      Strength   Overall Strength Comments  Assessed seated, er/IR adducted; supraspinatus strength is 4-/5    Strength Assessment Site  Shoulder    Right/Left Shoulder  Left    Left Shoulder Flexion  4+/5 4/5 previous    Left Shoulder ABduction  4-/5 4-/5 previous    Left Shoulder Internal Rotation  5/5 4+/5 previous    Left Shoulder External Rotation  4+/5 4/5 previous               OT Treatments/Exercises (OP) - 03/09/17 1607      Exercises   Exercises  Shoulder      Shoulder Exercises: Prone   Other Prone Exercises  low plank, high plank-30 seconds holds, bird dogs alternating arms-10 second holds      Shoulder Exercises: Standing   Other Standing Exercises  With green theraband: wall slides in flexion, lateral wall walks, diagonal wall walks, 15X each    Other Standing Exercises  Ball drops using green weighted ball with shoulder in abduction at shoulder height, 1'      Shoulder Exercises: ROM/Strengthening   Other  ROM/Strengthening Exercises  Alternating shoulder taps, 10X each arm, on mat table at 2.5 foot height; bent over rows, 10#, 10X    Other ROM/Strengthening Exercises  overhead carry, 10# weight, 3x down and back 2 hallways, 1 rest breaks with elbow bent and weight resting at shoulder height      Modalities   Modalities  Electrical Stimulation      Electrical Stimulation   Electrical Stimulation Location  left shoulder    Electrical Stimulation Action  interferential    Electrical Stimulation Parameters  24CV    Electrical Stimulation Goals  Pain             OT Education - 03/09/17 1752    Education provided  Yes    Education Details  shoulder stabilization III exercises. Provided information on TENS unit purchase. Reviewed frequency of exercise completion and educated on ability to do other workouts (gym, equipment) and progressing as tolerated.     Person(s) Educated  Patient    Methods  Explanation;Demonstration;Handout    Comprehension  Verbalized understanding;Returned demonstration       OT Short Term Goals - 03/09/17 1757      OT SHORT TERM GOAL #1   Title  Pt will be provided with and independent in HEP to improve mobility required for ADL completion.     Time  3    Period  Weeks    Status  Achieved      OT SHORT TERM GOAL #2   Title  Pt will improve LUE A/ROM to WNL to increase ability to reach behind back when bathing.     Time  6    Period  Weeks    Status  Achieved      OT SHORT TERM GOAL #3   Title  Pt will decrease LUE fascial restrictions from mod to min amounts or less to improve mobility required for overhead reaching tasks.     Time  6    Period  Weeks    Status  Achieved      OT SHORT TERM GOAL #4   Title  Pt will decrease pain in LUE to 2/10 or less to improve ability to perform ADLs using LUE as non-dominant.     Time  6    Period  Weeks    Status  Achieved      OT SHORT TERM GOAL #5   Title  Pt will improve LUE strength to 5/5 to increase  ability to drive motorcycle.     Time  6    Period  Weeks    Status  Partially Met      OT SHORT TERM GOAL #6   Title  Pt will return to highest level of functioning and independence in B/IADL completion using LUE as non-dominant.     Time  6    Period  Weeks    Status  Partially Met               Plan - 03/09/17 1756    Clinical Impression Statement  A: Pt reports MD suspects impingement in cervical neck, regarding shoulder has follow up appt in 4 months to determine if bridge procedure will be necessary. Reassessment completed today, pt has made good progress meeting 4/6 goals and partially meeting remaining 2 goals. Pt has expected weakness with supraspinatus testing, otherwise strength is functional at 4/5 to 5/5 with formal testing. Pt continues to have pain in the left arm at times, as well as constant dull ache in neck down to shoulder blade-has upcoming MRI to determine if nerve impingment is causing additional pain. At this time pt is competent in provided strengthening and shoulder stabilization exercises performing with good form, and is able to progress to HEP. Pt is agreeable to discharge.     Plan  P: Discharge pt    Consulted and Agree with Plan of Care  Patient       Patient will benefit from skilled therapeutic intervention in order to improve the following deficits and impairments:  Impaired perceived functional ability, Decreased strength, Impaired flexibility, Decreased range of motion, Pain, Impaired UE functional use, Increased fascial restrictions  Visit Diagnosis: Other symptoms and signs involving the musculoskeletal system  Acute pain of left shoulder    Problem List Patient Active Problem List   Diagnosis Date Noted  . Complete tear of right rotator cuff 02/05/2016  . Complete rotator cuff tear of left shoulder 02/05/2016    Guadelupe Sabin, OTR/L  [phone] 03/09/2017, 6:02 PM  [place], [place], [address] Phone: [phone]   Fax:  [phone]  Name: [name] Kelley MRN: [id number] Date of Birth: 18-Mar-1985   OCCUPATIONAL THERAPY DISCHARGE SUMMARY  Visits from Start of Care: 15  Current functional level related to goals / functional outcomes: See above. Pt has met goals and is able to use LUE as non-dominant.    Remaining deficits: Pt continues to have deficits with LUE strength, which is expected due to full supraspinatus tear. Occasional shoulder pain and/or soreness with exercises.    Education / Equipment: HEP updated throughout therapy Plan: Patient agrees to discharge.  Patient goals were met. Patient is being discharged due to being pleased with the current functional level.  ?????

## 2017-03-09 NOTE — Patient Instructions (Signed)
1) Low plank-30 seconds holds  While lying face down, lift your body up on your elbows and toes. Try and maintain a straight spine. Do not allow your hips or pelvis on either side to drop. Maintain pelvic neutral position the entire time.   2) High plank-30 seconds holds Start in a push up position on your hands and toes with elbows fully extended as shown. Try and maintain a straight spine. Do not allow your hips or pelvis on either side to drop. Maintain pelvic neutral position the entire time.   3) [place]oka LintBird Dogs-30 second holds (each arm) Hold a plank position in full elbow extension position with your legs spread slightly apart as shown. Do not let your back arch down. While holding this position, raise one arm up and then set it back down. Then perform on the opposite arm and repeat.        4) Bent over rows-with green band or weights; 10X  Hold one end of the elastic band in each hand. Lay the band across the floor and step on it with feet shoulder width apart. Hinge at the hips to maintain a quarter squat.  Pull your hands towards your hips, squeezing the shoulder blades together. Do not let your shoulder rise up towards your elbows. Slowly return to starting position and repeat.                            5) Neck stretch: hold for 10 seconds, complete 3x Stand up straight. Place one arm behind your back. Turn your head the other direction and use hand to gently pull chin towards armpit. Hold a gentle stretch.

## 2017-03-10 NOTE — Addendum Note (Signed)
Addended by: Ezra SitesROXLER, LESLIE A on: 03/10/2017 02:09 PM   Modules accepted: Orders

## 2017-03-13 ENCOUNTER — Ambulatory Visit
Admission: RE | Admit: 2017-03-13 | Discharge: 2017-03-13 | Disposition: A | Discharge status: Home / Self Care | Payer: 59 | Source: Ambulatory Visit | Attending: Orthopedic Surgery | Admitting: Orthopedic Surgery

## 2017-03-13 DIAGNOSIS — M79602 Pain in left arm: Secondary | ICD-10-CM

## 2017-03-13 DIAGNOSIS — M5023 Other cervical disc displacement, cervicothoracic region: Secondary | ICD-10-CM | POA: Diagnosis not present

## 2017-06-24 ENCOUNTER — Ambulatory Visit (INDEPENDENT_AMBULATORY_CARE_PROVIDER_SITE_OTHER): Payer: 59 | Admitting: Orthopedic Surgery

## 2017-07-04 ENCOUNTER — Ambulatory Visit (INDEPENDENT_AMBULATORY_CARE_PROVIDER_SITE_OTHER): Payer: 59

## 2017-07-04 ENCOUNTER — Encounter (INDEPENDENT_AMBULATORY_CARE_PROVIDER_SITE_OTHER): Payer: Self-pay | Admitting: Orthopedic Surgery

## 2017-07-04 ENCOUNTER — Ambulatory Visit (INDEPENDENT_AMBULATORY_CARE_PROVIDER_SITE_OTHER): Payer: 59 | Admitting: Orthopedic Surgery

## 2017-07-04 DIAGNOSIS — M545 Low back pain, unspecified: Secondary | ICD-10-CM

## 2017-07-09 ENCOUNTER — Encounter (INDEPENDENT_AMBULATORY_CARE_PROVIDER_SITE_OTHER): Payer: Self-pay | Admitting: Orthopedic Surgery

## 2017-07-09 NOTE — Progress Notes (Signed)
Office Visit Note   Patient: [name] Kelley           Date of Birth: 26-Aug-1985           MRN: [id number] Visit Date: 07/04/2017 Requested by: Elfredia Nevins, MD [address] [place], [place] [address] PCP: Elfredia Nevins, MD  Subjective: Chief Complaint  Patient presents with  . Left Shoulder - Follow-up  . Neck - Follow-up  . Lower Back - Pain    HPI: [name] Kelley is a patient with left shoulder and neck pain.  He does have a C5-6 disc with left greater than right-sided compression.  He also has an irreparable supraspinatus rotator cuff tear.  Still reports some coarse grinding and limited range of motion on that side.  He also describes 6-week history of right-sided low back pain which is intermittent.  Denies any radiation.  He did see a chiropractor once but did not help.  Takes oxycodone occasionally which is left over from his surgery.  Has not yet been back to work.  Hurts him more in his lumbar spine with extension.  He does do a lot of pushing and pulling at work.  He is now about 8 months out from left shoulder arthroscopy and debridement and the rotator cuff was not repairable at that time              ROS: All systems reviewed are negative as they relate to the chief complaint within the history of present illness.  Patient denies  fevers or chills.   Assessment & Plan: Visit Diagnoses:  1. Acute right-sided low back pain without sciatica     Plan: Impression is functional left shoulder which may require superior capsular reconstruction in the future as a salvage procedure.  More problematic now is at C5-6 disc which does show some compression.  I think that is still symptomatic for him.  Out of work 2 more months due to the neck and then I think we could potentially return him to work but he may not be fully functional due to the left shoulder.  Radiographs of the L-spine unremarkable as is the exam.  Do not think he really warrants lumbar spine MRI yet  Follow-Up  Instructions: Return in about 8 weeks (around 08/29/2017).   Orders:  Orders Placed This Encounter  Procedures  . XR Lumbar Spine 2-3 Views   No orders of the defined types were placed in this encounter.     Procedures: No procedures performed   Clinical Data: No additional findings.  Objective: Vital Signs: There were no vitals taken for this visit.  Physical Exam:   Constitutional: Patient appears well-developed HEENT:  Head: Normocephalic Eyes:EOM are normal Neck: Normal range of motion Cardiovascular: Normal rate Pulmonary/chest: Effort normal Neurologic: Patient is alert Skin: Skin is warm Psychiatric: Patient has normal mood and affect    Ortho Exam: Orthopedic exam demonstrates some weakness and grinding with left shoulder passive and active range of motion.  He does have forward flexion and abduction above 90 degrees.  Neck range of motion is tender with rotation to the left.  Reflexes symmetric bilateral biceps triceps radial pulse intact bilaterally.  No masses lymphadenopathy or skin changes noted in that shoulder girdle region or neck region.  No nerve root tension signs in the lower extremities.  Not much pain with forward and lateral bending.  No paresthesias L1 S1 bilaterally.  Pedal pulses intact.  Negative clonus bilateral.  No muscle atrophy in the legs.  Specialty Comments:  No specialty comments available.  Imaging: No results found.   PMFS History: Patient Active Problem List   Diagnosis Date Noted  . Complete tear of right rotator cuff 02/05/2016  . Complete rotator cuff tear of left shoulder 02/05/2016   Past Medical History:  Diagnosis Date  . Medical history non-contributory     History reviewed. No pertinent family history.  Past Surgical History:  Procedure Laterality Date  . Arm Manipulation Right 1995?  . INCISION AND DRAINAGE ABSCESS N/A 01/05/2013   Procedure: INCISION AND DRAINAGE PERIANAL  ABSCESS;  Surgeon: Dalia Heading,  MD;  Location: [facility];  Service: General;  Laterality: N/A;  . SHOULDER ARTHROSCOPY WITH OPEN ROTATOR CUFF REPAIR AND DISTAL CLAVICLE ACROMINECTOMY Left 11/02/2016   Procedure: LEFT SHOULDER ARTHROSCOPY WITH MINI OPEN ROTATOR CUFF REPAIR, OPEN BICEPS TENODESIS, AND DEBRIDEMENT;  Surgeon: Cammy Copa, MD;  Location: [facility];  Service: Orthopedics;  Laterality: Left;  . SHOULDER ARTHROSCOPY WITH SUBACROMIAL DECOMPRESSION, ROTATOR CUFF REPAIR AND BICEP TENDON REPAIR Right 12/09/2015   Procedure: SHOULDER DIAGNOSTIC OPERATIVE ARTHROSCOPY, SUBACROMIAL DECOMPRESSION, DISTAL CLAVICLE EXCISION, MINI-OPEN ROTATOR CUFF REPAIR AND BICEP TENODESIS.;  Surgeon: Cammy Copa, MD;  Location: [facility];  Service: Orthopedics;  Laterality: Right;  . widsom teeth  2017   Social History   Occupational History  . Not on file  Tobacco Use  . Smoking status: Never Smoker  . Smokeless tobacco: Never Used  Substance and Sexual Activity  . Alcohol use: No  . Drug use: No  . Sexual activity: Not on file

## 2017-07-15 DIAGNOSIS — G4700 Insomnia, unspecified: Secondary | ICD-10-CM | POA: Diagnosis not present

## 2017-07-15 DIAGNOSIS — M545 Low back pain: Secondary | ICD-10-CM | POA: Diagnosis not present

## 2017-07-15 DIAGNOSIS — M542 Cervicalgia: Secondary | ICD-10-CM | POA: Diagnosis not present

## 2017-07-15 DIAGNOSIS — Z6837 Body mass index (BMI) 37.0-37.9, adult: Secondary | ICD-10-CM | POA: Diagnosis not present

## 2017-07-15 DIAGNOSIS — M75100 Unspecified rotator cuff tear or rupture of unspecified shoulder, not specified as traumatic: Secondary | ICD-10-CM | POA: Diagnosis not present

## 2017-07-18 ENCOUNTER — Telehealth (INDEPENDENT_AMBULATORY_CARE_PROVIDER_SITE_OTHER): Payer: Self-pay | Admitting: *Deleted

## 2017-07-18 ENCOUNTER — Other Ambulatory Visit (INDEPENDENT_AMBULATORY_CARE_PROVIDER_SITE_OTHER): Payer: Self-pay

## 2017-07-18 DIAGNOSIS — E782 Mixed hyperlipidemia: Secondary | ICD-10-CM | POA: Diagnosis not present

## 2017-07-18 DIAGNOSIS — I10 Essential (primary) hypertension: Secondary | ICD-10-CM | POA: Diagnosis not present

## 2017-07-18 DIAGNOSIS — M545 Low back pain, unspecified: Secondary | ICD-10-CM

## 2017-07-18 NOTE — Telephone Encounter (Signed)
I tried calling patient to discuss, no answer and VM is full. I did go ahead and put MRI of lumbar spine order in but per Dr August Saucerean no need to repeat cervical since just had one in January. He just saw Dr August Saucerean recently for his lumbar spine.

## 2017-07-18 NOTE — Telephone Encounter (Signed)
noted 

## 2017-07-18 NOTE — Telephone Encounter (Signed)
Back scan ok - mri done 1 19 no need to repeat - pls call him to get his story

## 2017-07-18 NOTE — Telephone Encounter (Signed)
Patient had MRI of his neck earlier this year? Maybe he was referring to lumbar spine, xrays of that done at last OV. Please advise. Thanks.

## 2017-07-18 NOTE — Telephone Encounter (Signed)
Pt called left message on my vm stating he was in to see Dr. August Saucerean couple weeks ago for neck pain and says now his pain has progressed and wants to know if he can go ahead and get the MRI ordered. Ok to order MRI? Please advise.  CB# [phone]

## 2017-07-19 NOTE — Telephone Encounter (Signed)
I tried calling pt again this am and no answer and voice mail full

## 2017-07-22 DIAGNOSIS — M75100 Unspecified rotator cuff tear or rupture of unspecified shoulder, not specified as traumatic: Secondary | ICD-10-CM | POA: Diagnosis not present

## 2017-07-22 DIAGNOSIS — E876 Hypokalemia: Secondary | ICD-10-CM | POA: Diagnosis not present

## 2017-07-22 DIAGNOSIS — D696 Thrombocytopenia, unspecified: Secondary | ICD-10-CM | POA: Diagnosis not present

## 2017-07-22 DIAGNOSIS — E782 Mixed hyperlipidemia: Secondary | ICD-10-CM | POA: Diagnosis not present

## 2017-07-22 DIAGNOSIS — G4700 Insomnia, unspecified: Secondary | ICD-10-CM | POA: Diagnosis not present

## 2017-07-22 DIAGNOSIS — M545 Low back pain: Secondary | ICD-10-CM | POA: Diagnosis not present

## 2017-07-22 DIAGNOSIS — M542 Cervicalgia: Secondary | ICD-10-CM | POA: Diagnosis not present

## 2017-07-22 DIAGNOSIS — Z0000 Encounter for general adult medical examination without abnormal findings: Secondary | ICD-10-CM | POA: Diagnosis not present

## 2017-08-23 ENCOUNTER — Ambulatory Visit
Admission: RE | Admit: 2017-08-23 | Discharge: 2017-08-23 | Disposition: A | Discharge status: Home / Self Care | Payer: 59 | Source: Ambulatory Visit | Attending: Orthopedic Surgery | Admitting: Orthopedic Surgery

## 2017-08-23 DIAGNOSIS — M545 Low back pain, unspecified: Secondary | ICD-10-CM

## 2017-08-23 DIAGNOSIS — M5126 Other intervertebral disc displacement, lumbar region: Secondary | ICD-10-CM | POA: Diagnosis not present

## 2017-08-23 DIAGNOSIS — M48061 Spinal stenosis, lumbar region without neurogenic claudication: Secondary | ICD-10-CM | POA: Diagnosis not present

## 2017-09-02 ENCOUNTER — Encounter (INDEPENDENT_AMBULATORY_CARE_PROVIDER_SITE_OTHER): Payer: Self-pay | Admitting: Orthopedic Surgery

## 2017-09-02 ENCOUNTER — Ambulatory Visit (INDEPENDENT_AMBULATORY_CARE_PROVIDER_SITE_OTHER): Payer: 59 | Admitting: Orthopedic Surgery

## 2017-09-02 DIAGNOSIS — G8929 Other chronic pain: Secondary | ICD-10-CM | POA: Diagnosis not present

## 2017-09-02 DIAGNOSIS — M5442 Lumbago with sciatica, left side: Secondary | ICD-10-CM | POA: Diagnosis not present

## 2017-09-04 ENCOUNTER — Encounter (INDEPENDENT_AMBULATORY_CARE_PROVIDER_SITE_OTHER): Payer: Self-pay | Admitting: Orthopedic Surgery

## 2017-09-04 NOTE — Progress Notes (Signed)
Office Visit Note   Patient: [name] Kelley           Date of Birth: 11-Aug-1985           MRN: [id number] Visit Date: 09/02/2017 Requested by: Elfredia Nevins, MD [address] [place], [place] [address] PCP: Elfredia Nevins, MD  Subjective: Chief Complaint  Patient presents with  . Lower Back - Follow-up    HPI: [name] Kelley is a patient with low back pain.  He is here to review MRI scan of his lumbar spine.  Scan is reviewed with the patient.  He has noncompressive disc protrusions at L2-3 S1.  The most significant finding however is endplate edema at Q5-9 and to a lesser degree at L5-S1.  Disc hydration is less at these levels.  Currently he is not working.              ROS: All systems reviewed are negative as they relate to the chief complaint within the history of present illness.  Patient denies  fevers or chills.   Assessment & Plan: Visit Diagnoses:  1. Chronic bilateral low back pain with left-sided sciatica     Plan: Impression is low back pain with endplate edema and degeneration at L4-5 and L5-S1.  Plan is anti-inflammatories as needed along with a course of physical therapy.  If that does not help then we should proceed with epidural steroid injections.  If that fails to relieve his symptoms and he will need to consider operative intervention for this problem.  Does not look like discitis.  Does not look like any type of compression fracture.  Follow-Up Instructions: Return if symptoms worsen or fail to improve.   Orders:  No orders of the defined types were placed in this encounter.  No orders of the defined types were placed in this encounter.     Procedures: No procedures performed   Clinical Data: No additional findings.  Objective: Vital Signs: There were no vitals taken for this visit.  Physical Exam:   Constitutional: Patient appears well-developed HEENT:  Head: Normocephalic Eyes:EOM are normal Neck: Normal range of motion Cardiovascular:  Normal rate Pulmonary/chest: Effort normal Neurologic: Patient is alert Skin: Skin is warm Psychiatric: Patient has normal mood and affect    Ortho Exam: Ortho exam demonstrates no nerve root tension signs today and no groin pain.  Mild pain with extension but less pain with forward flexion.  Pedal pulses palpable  Specialty Comments:  No specialty comments available.  Imaging: No results found.   PMFS History: Patient Active Problem List   Diagnosis Date Noted  . Complete tear of right rotator cuff 02/05/2016  . Complete rotator cuff tear of left shoulder 02/05/2016   Past Medical History:  Diagnosis Date  . Medical history non-contributory     History reviewed. No pertinent family history.  Past Surgical History:  Procedure Laterality Date  . Arm Manipulation Right 1995?  . INCISION AND DRAINAGE ABSCESS N/A 01/05/2013   Procedure: INCISION AND DRAINAGE PERIANAL  ABSCESS;  Surgeon: Dalia Heading, MD;  Location: [facility];  Service: General;  Laterality: N/A;  . SHOULDER ARTHROSCOPY WITH OPEN ROTATOR CUFF REPAIR AND DISTAL CLAVICLE ACROMINECTOMY Left 11/02/2016   Procedure: LEFT SHOULDER ARTHROSCOPY WITH MINI OPEN ROTATOR CUFF REPAIR, OPEN BICEPS TENODESIS, AND DEBRIDEMENT;  Surgeon: Cammy Copa, MD;  Location: [facility];  Service: Orthopedics;  Laterality: Left;  . SHOULDER ARTHROSCOPY WITH SUBACROMIAL DECOMPRESSION, ROTATOR CUFF REPAIR AND BICEP TENDON REPAIR Right 12/09/2015   Procedure:  SHOULDER DIAGNOSTIC OPERATIVE ARTHROSCOPY, SUBACROMIAL DECOMPRESSION, DISTAL CLAVICLE EXCISION, MINI-OPEN ROTATOR CUFF REPAIR AND BICEP TENODESIS.;  Surgeon: Cammy Copa, MD;  Location: [facility];  Service: Orthopedics;  Laterality: Right;  . widsom teeth  2017   Social History   Occupational History  . Not on file  Tobacco Use  . Smoking status: Never Smoker  . Smokeless tobacco: Never Used  Substance and Sexual Activity  . Alcohol use: No  . Drug use: No  . Sexual activity:  Not on file

## 2018-03-29 ENCOUNTER — Encounter (INDEPENDENT_AMBULATORY_CARE_PROVIDER_SITE_OTHER): Payer: Self-pay | Admitting: Orthopedic Surgery

## 2018-03-29 ENCOUNTER — Ambulatory Visit (INDEPENDENT_AMBULATORY_CARE_PROVIDER_SITE_OTHER): Payer: 59 | Admitting: Orthopedic Surgery

## 2018-03-29 ENCOUNTER — Ambulatory Visit (INDEPENDENT_AMBULATORY_CARE_PROVIDER_SITE_OTHER): Payer: 59

## 2018-03-29 DIAGNOSIS — G8929 Other chronic pain: Secondary | ICD-10-CM

## 2018-03-29 DIAGNOSIS — M5412 Radiculopathy, cervical region: Secondary | ICD-10-CM

## 2018-03-29 DIAGNOSIS — M25572 Pain in left ankle and joints of left foot: Secondary | ICD-10-CM

## 2018-03-29 DIAGNOSIS — M25512 Pain in left shoulder: Secondary | ICD-10-CM

## 2018-03-29 MED ORDER — METHOCARBAMOL 500 MG PO TABS: 500.0000 mg | ORAL_TABLET | Freq: Four times a day (QID) | ORAL | 0 refills | Status: DC

## 2018-03-29 MED ORDER — METHYLPREDNISOLONE 4 MG PO TABS: ORAL_TABLET | ORAL | 0 refills | Status: DC

## 2018-03-29 MED ORDER — HYDROCODONE-ACETAMINOPHEN 5-325 MG PO TABS: 1.0000 | ORAL_TABLET | Freq: Three times a day (TID) | ORAL | 0 refills | Status: DC | PRN

## 2018-04-01 ENCOUNTER — Encounter (INDEPENDENT_AMBULATORY_CARE_PROVIDER_SITE_OTHER): Payer: Self-pay | Admitting: Orthopedic Surgery

## 2018-04-01 NOTE — Progress Notes (Signed)
Office Visit Note   Patient: [name] Kelley           Date of Birth: 14-Aug-1985           MRN: [id number] Visit Date: 03/29/2018 Requested by: [name] Nevins, MD [address] [place], [place] [address] PCP: [name] Nevins, MD  Subjective: Chief Complaint  Patient presents with  . Left Shoulder - Pain  . Neck - Pain  . Left Ankle - Pain    HPI: [name] Kelley is a patient who presents for evaluation of multiple orthopedic complaints.  He describes left neck pain for 3 weeks duration.  Pain radiates from the posterior neck into the left shoulder.  Does use a TENS unit for this.  Did have MRI cervical spine 03/13/2017 which showed central disc at C5-6.  He did not have epidural steroid injections for this.  Extension of the neck increases his pain.  Patient also reports left shoulder pain.  He had some bruising which came up in the left shoulder recently.  He does have a known history of irreparable supraspinatus tendon tear 11/02/2017 noted on left shoulder arthroscopy.  Biceps tenodesis performed at that time but the rotator cuff repair was not possible due to retraction of that supraspinatus tendon.  He did have intact subscapularis and infraspinatus at that time.  Patient also reports left ankle pain.  He states he usually sees Dr. Lajoyce Kelley for this problem.  Localizes the pain globally around the ankle joint.  Does have some difficulty with prolonged ambulation.  He is not taking much in the way of medication for any of these problems.              ROS: All systems reviewed are negative as they relate to the chief complaint within the history of present illness.  Patient denies  fevers or chills.   Assessment & Plan: Visit Diagnoses:  1. Radiculopathy, cervical region   2. Chronic left shoulder pain   3. Chronic pain of left ankle     Plan: Impression is neck pain with known central disc herniation from MRI scan done a year ago.  No ESI's were done at that time.  I like to try him on a  Medrol Dosepak and Robaxin for that problem.  Norco prescription 1 time provided with no refills.  In regards to that left shoulder he has an irreparable rotator cuff tear with gradual narrowing of the acromiohumeral distance.  I think for pain relief and function he may benefit from superior capsular reconstruction when he gets to that point.  I think his infraspinatus and supraspinatus suspension bridge cable system is intact giving him some degree of functionality in that shoulder with restoration of the anterior posterior force couple.  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  I will refer him to see Dr. Lajoyce Kelley for that problem.  Follow-up with me as needed.  I think in general he is in the mode of waiting most of these problems out.  Follow-Up Instructions: No follow-ups on file.   Orders:  Orders Placed This Encounter  Procedures  . XR Cervical Spine 2 or 3 views  . XR Shoulder Left  . XR Ankle Complete Left   Meds ordered this encounter  Medications  . methocarbamol (ROBAXIN) 500 MG tablet    Sig: Take 1 tablet (500 mg total) by mouth 4 (four) times daily.    Dispense:  30 tablet  Refill:  0  . methylPREDNISolone (MEDROL) 4 MG tablet    Sig: Take as directed, per insert medrol dosepak    Dispense:  21 tablet    Refill:  0  . HYDROcodone-acetaminophen (NORCO) 5-325 MG tablet    Sig: Take 1 tablet by mouth every 8 (eight) hours as needed for moderate pain.    Dispense:  20 tablet    Refill:  0      Procedures: No procedures performed   Clinical Data: No additional findings.  Objective: Vital Signs: There were no vitals taken for this visit.  Physical Exam:   Constitutional: Patient appears well-developed HEENT:  Head: Normocephalic Eyes:EOM are normal Neck: Normal range of motion Cardiovascular: Normal rate Pulmonary/chest: Effort normal Neurologic: Patient is alert Skin: Skin is warm Psychiatric: Patient  has normal mood and affect    Ortho Exam: Ortho exam demonstrates good cervical spine range of motion with 5 out of 5 grip EPL FPL interosseous wrist flexion extension bicep triceps and deltoid strength.  No paresthesias C5-T1.  Reflexes symmetric they are 1+ out of 4 bilateral biceps and and triceps.  No muscle atrophy in the shoulder girdle or arm region bilaterally.  Radial pulses intact bilaterally.  Shoulder range of motion bilaterally is good with forward flexion abduction external rotation.  Does have little bit of supraspinatus weakness on the left compared to the right along with some coarseness and grinding on the left compared to the right.  Negative apprehension relocation testing bilaterally.  Left ankle is examined.  Patient does have some restricted dorsiflexion plantarflexion with palpable intact nontender anterior to posterior to peroneal and Achilles tendons.  The ankle itself is stable to varus tilt testing and anterior drawer testing.  Specialty Comments:  No specialty comments available.  Imaging: No results found.   PMFS History: Patient Active Problem List   Diagnosis Date Noted  . Complete tear of right rotator cuff 02/05/2016  . Complete rotator cuff tear of left shoulder 02/05/2016   Past Medical History:  Diagnosis Date  . Medical history non-contributory     History reviewed. No pertinent family history.  Past Surgical History:  Procedure Laterality Date  . Arm Manipulation Right 1995?  . INCISION AND DRAINAGE ABSCESS N/A 01/05/2013   Procedure: INCISION AND DRAINAGE PERIANAL  ABSCESS;  Surgeon: [name] Heading, MD;  Location: [facility];  Service: General;  Laterality: N/A;  . SHOULDER ARTHROSCOPY WITH OPEN ROTATOR CUFF REPAIR AND DISTAL CLAVICLE ACROMINECTOMY Left 11/02/2016   Procedure: LEFT SHOULDER ARTHROSCOPY WITH MINI OPEN ROTATOR CUFF REPAIR, OPEN BICEPS TENODESIS, AND DEBRIDEMENT;  Surgeon: [name] Copa, MD;  Location: [facility];  Service: Orthopedics;   Laterality: Left;  . SHOULDER ARTHROSCOPY WITH SUBACROMIAL DECOMPRESSION, ROTATOR CUFF REPAIR AND BICEP TENDON REPAIR Right 12/09/2015   Procedure: SHOULDER DIAGNOSTIC OPERATIVE ARTHROSCOPY, SUBACROMIAL DECOMPRESSION, DISTAL CLAVICLE EXCISION, MINI-OPEN ROTATOR CUFF REPAIR AND BICEP TENODESIS.;  Surgeon: [name] Copa, MD;  Location: [facility];  Service: Orthopedics;  Laterality: Right;  . widsom teeth  2017   Social History   Occupational History  . Not on file  Tobacco Use  . Smoking status: Never Smoker  . Smokeless tobacco: Never Used  Substance and Sexual Activity  . Alcohol use: No  . Drug use: No  . Sexual activity: Not on file

## 2018-04-25 ENCOUNTER — Ambulatory Visit (INDEPENDENT_AMBULATORY_CARE_PROVIDER_SITE_OTHER): Payer: 59 | Admitting: Orthopedic Surgery

## 2018-05-01 ENCOUNTER — Ambulatory Visit (INDEPENDENT_AMBULATORY_CARE_PROVIDER_SITE_OTHER): Payer: 59 | Admitting: Orthopedic Surgery

## 2018-05-25 ENCOUNTER — Telehealth (INDEPENDENT_AMBULATORY_CARE_PROVIDER_SITE_OTHER): Payer: Self-pay

## 2018-05-25 NOTE — Telephone Encounter (Signed)
Called and lm on vm to have pt call back for COVID-19 prescreen questions.

## 2018-05-29 ENCOUNTER — Ambulatory Visit (INDEPENDENT_AMBULATORY_CARE_PROVIDER_SITE_OTHER): Payer: 59 | Admitting: Orthopedic Surgery

## 2018-07-12 DIAGNOSIS — G4700 Insomnia, unspecified: Secondary | ICD-10-CM | POA: Diagnosis not present

## 2018-07-12 DIAGNOSIS — M545 Low back pain: Secondary | ICD-10-CM | POA: Diagnosis not present

## 2018-07-12 DIAGNOSIS — M542 Cervicalgia: Secondary | ICD-10-CM | POA: Diagnosis not present

## 2018-07-12 DIAGNOSIS — D696 Thrombocytopenia, unspecified: Secondary | ICD-10-CM | POA: Diagnosis not present

## 2018-07-12 DIAGNOSIS — Z6837 Body mass index (BMI) 37.0-37.9, adult: Secondary | ICD-10-CM | POA: Diagnosis not present

## 2018-07-12 DIAGNOSIS — E782 Mixed hyperlipidemia: Secondary | ICD-10-CM | POA: Diagnosis not present

## 2018-07-12 DIAGNOSIS — E876 Hypokalemia: Secondary | ICD-10-CM | POA: Diagnosis not present

## 2018-07-12 DIAGNOSIS — Z0000 Encounter for general adult medical examination without abnormal findings: Secondary | ICD-10-CM | POA: Diagnosis not present

## 2018-07-12 DIAGNOSIS — M75100 Unspecified rotator cuff tear or rupture of unspecified shoulder, not specified as traumatic: Secondary | ICD-10-CM | POA: Diagnosis not present

## 2018-07-24 DIAGNOSIS — E785 Hyperlipidemia, unspecified: Secondary | ICD-10-CM | POA: Diagnosis not present

## 2018-07-24 DIAGNOSIS — D696 Thrombocytopenia, unspecified: Secondary | ICD-10-CM | POA: Diagnosis not present

## 2018-07-24 DIAGNOSIS — Z0000 Encounter for general adult medical examination without abnormal findings: Secondary | ICD-10-CM | POA: Diagnosis not present

## 2018-07-24 DIAGNOSIS — E875 Hyperkalemia: Secondary | ICD-10-CM | POA: Diagnosis not present

## 2018-07-24 DIAGNOSIS — Z0001 Encounter for general adult medical examination with abnormal findings: Secondary | ICD-10-CM | POA: Diagnosis not present

## 2018-07-24 DIAGNOSIS — E876 Hypokalemia: Secondary | ICD-10-CM | POA: Diagnosis not present

## 2018-07-24 DIAGNOSIS — M545 Low back pain: Secondary | ICD-10-CM | POA: Diagnosis not present

## 2018-07-24 DIAGNOSIS — Z6837 Body mass index (BMI) 37.0-37.9, adult: Secondary | ICD-10-CM | POA: Diagnosis not present

## 2018-07-24 DIAGNOSIS — M542 Cervicalgia: Secondary | ICD-10-CM | POA: Diagnosis not present

## 2018-07-24 DIAGNOSIS — E782 Mixed hyperlipidemia: Secondary | ICD-10-CM | POA: Diagnosis not present

## 2018-07-24 DIAGNOSIS — G4700 Insomnia, unspecified: Secondary | ICD-10-CM | POA: Diagnosis not present

## 2018-07-24 DIAGNOSIS — M75100 Unspecified rotator cuff tear or rupture of unspecified shoulder, not specified as traumatic: Secondary | ICD-10-CM | POA: Diagnosis not present

## 2018-07-26 DIAGNOSIS — D696 Thrombocytopenia, unspecified: Secondary | ICD-10-CM | POA: Diagnosis not present

## 2018-08-01 ENCOUNTER — Encounter (HOSPITAL_COMMUNITY): Payer: Self-pay | Admitting: *Deleted

## 2018-08-01 ENCOUNTER — Other Ambulatory Visit: Payer: Self-pay

## 2018-08-02 ENCOUNTER — Encounter (HOSPITAL_COMMUNITY): Payer: Self-pay | Admitting: Hematology

## 2018-08-02 ENCOUNTER — Inpatient Hospital Stay (HOSPITAL_COMMUNITY): Payer: 59

## 2018-08-02 ENCOUNTER — Inpatient Hospital Stay (HOSPITAL_COMMUNITY): Payer: 59 | Attending: Hematology | Admitting: Hematology

## 2018-08-02 VITALS — BP 134/87 | HR 91 | Temp 98.4°F | Resp 18 | Wt 264.0 lb

## 2018-08-02 DIAGNOSIS — R7989 Other specified abnormal findings of blood chemistry: Secondary | ICD-10-CM | POA: Insufficient documentation

## 2018-08-02 DIAGNOSIS — Z800 Family history of malignant neoplasm of digestive organs: Secondary | ICD-10-CM | POA: Insufficient documentation

## 2018-08-02 DIAGNOSIS — R140 Abdominal distension (gaseous): Secondary | ICD-10-CM | POA: Diagnosis not present

## 2018-08-02 DIAGNOSIS — R0609 Other forms of dyspnea: Secondary | ICD-10-CM | POA: Diagnosis not present

## 2018-08-02 DIAGNOSIS — D61818 Other pancytopenia: Secondary | ICD-10-CM | POA: Diagnosis not present

## 2018-08-02 DIAGNOSIS — D696 Thrombocytopenia, unspecified: Secondary | ICD-10-CM

## 2018-08-02 DIAGNOSIS — R630 Anorexia: Secondary | ICD-10-CM | POA: Diagnosis not present

## 2018-08-02 DIAGNOSIS — R5383 Other fatigue: Secondary | ICD-10-CM | POA: Diagnosis not present

## 2018-08-02 DIAGNOSIS — D72819 Decreased white blood cell count, unspecified: Secondary | ICD-10-CM | POA: Diagnosis not present

## 2018-08-02 DIAGNOSIS — Z79899 Other long term (current) drug therapy: Secondary | ICD-10-CM | POA: Insufficient documentation

## 2018-08-02 DIAGNOSIS — Z8042 Family history of malignant neoplasm of prostate: Secondary | ICD-10-CM | POA: Diagnosis not present

## 2018-08-02 DIAGNOSIS — R61 Generalized hyperhidrosis: Secondary | ICD-10-CM | POA: Insufficient documentation

## 2018-08-02 LAB — IRON AND TIBC
Iron: 83 ug/dL (ref 45–182)
Saturation Ratios: 28 % (ref 17.9–39.5)
TIBC: 298 ug/dL (ref 250–450)
UIBC: 215 ug/dL

## 2018-08-02 LAB — COMPREHENSIVE METABOLIC PANEL
ALT: 43 U/L (ref 0–44)
AST: 35 U/L (ref 15–41)
Albumin: 3.9 g/dL (ref 3.5–5.0)
Alkaline Phosphatase: 68 U/L (ref 38–126)
Anion gap: 6 (ref 5–15)
BUN: 18 mg/dL (ref 6–20)
CO2: 25 mmol/L (ref 22–32)
Calcium: 8.8 mg/dL — ABNORMAL LOW (ref 8.9–10.3)
Chloride: 109 mmol/L (ref 98–111)
Creatinine, Ser: 1.05 mg/dL (ref 0.61–1.24)
GFR calc Af Amer: >60 mL/min (ref >60)
GFR calc non Af Amer: >60 mL/min (ref >60)
Glucose, Bld: 118 mg/dL — ABNORMAL HIGH (ref 70–99)
Potassium: 3.9 mmol/L (ref 3.5–5.1)
Sodium: 140 mmol/L (ref 135–145)
Total Bilirubin: 1 mg/dL (ref 0.3–1.2)
Total Protein: 6.8 g/dL (ref 6.5–8.1)

## 2018-08-02 LAB — CBC WITH DIFFERENTIAL/PLATELET
Abs Immature Granulocytes: 0.02 10*3/uL (ref 0.00–0.07)
Basophils Absolute: 0 10*3/uL (ref 0.0–0.1)
Basophils Relative: 1 %
Eosinophils Absolute: 0 10*3/uL (ref 0.0–0.5)
Eosinophils Relative: 3 %
HCT: 42.2 % (ref 39.0–52.0)
Hemoglobin: 13.5 g/dL (ref 13.0–17.0)
Immature Granulocytes: 1 %
Lymphocytes Relative: 46 %
Lymphs Abs: 0.7 10*3/uL (ref 0.7–4.0)
MCH: 28.9 pg (ref 26.0–34.0)
MCHC: 32 g/dL (ref 30.0–36.0)
MCV: 90.4 fL (ref 80.0–100.0)
Monocytes Absolute: 0.2 10*3/uL (ref 0.1–1.0)
Monocytes Relative: 11 %
Neutro Abs: 0.6 10*3/uL — ABNORMAL LOW (ref 1.7–7.7)
Neutrophils Relative %: 38 %
Platelets: 80 10*3/uL — ABNORMAL LOW (ref 150–400)
RBC: 4.67 MIL/uL (ref 4.22–5.81)
RDW: 16.4 % — ABNORMAL HIGH (ref 11.5–15.5)
WBC: 1.5 10*3/uL — ABNORMAL LOW (ref 4.0–10.5)
nRBC: 0 % (ref 0.0–0.2)

## 2018-08-02 LAB — C-REACTIVE PROTEIN: CRP: 1 mg/dL — ABNORMAL HIGH (ref <1.0)

## 2018-08-02 LAB — RETICULOCYTES
Immature Retic Fract: 7.7 % (ref 2.3–15.9)
RBC.: 4.67 MIL/uL (ref 4.22–5.81)
Retic Count, Absolute: 128 10*3/uL (ref 19.0–186.0)
Retic Ct Pct: 2.7 % (ref 0.4–3.1)

## 2018-08-02 LAB — LACTATE DEHYDROGENASE: LDH: 154 U/L (ref 98–192)

## 2018-08-02 LAB — SEDIMENTATION RATE: Sed Rate: 1 mm/hr (ref 0–16)

## 2018-08-02 LAB — FERRITIN: Ferritin: 104 ng/mL (ref 24–336)

## 2018-08-02 LAB — VITAMIN B12: Vitamin B-12: 669 pg/mL (ref 180–914)

## 2018-08-02 LAB — FOLATE: Folate: 12.2 ng/mL (ref >5.9)

## 2018-08-02 NOTE — Patient Instructions (Signed)
[place] at [facility] Discharge Instructions  You were seen today by Dr. Delton Coombes. He went over your history, family history and how you've been feeling lately. He will have blood work done today. He will see you back in 2 weeks for follow up.   Thank you for choosing [place] at [facility] to provide your oncology and hematology care.  To afford each patient quality time with our provider, please arrive at least 15 minutes before your scheduled appointment time.   If you have a lab appointment with the [facility] please come in thru the  Main Entrance and check in at the main information desk  You need to re-schedule your appointment should you arrive 10 or more minutes late.  We strive to give you quality time with our providers, and arriving late affects you and other patients whose appointments are after yours.  Also, if you no show three or more times for appointments you may be dismissed from the clinic at the providers discretion.     Again, thank you for choosing [facility].  Our hope is that these requests will decrease the amount of time that you wait before being seen by our physicians.       _____________________________________________________________  Should you have questions after your visit to [facility], please contact our office at [phone] between the hours of 8:00 a.m. and 4:30 p.m.  Voicemails left after 4:00 p.m. will not be returned until the following business day.  For prescription refill requests, have your pharmacy contact our office and allow 72 hours.    Cancer Center Support Programs:   > Cancer Support Group  2nd Tuesday of the month 1pm-2pm, Journey Room

## 2018-08-02 NOTE — Assessment & Plan Note (Addendum)
1.  Severe neutropenia and moderate thrombocytopenia: - Routine labs done at Dr. Juel Burrow office on 07/12/2018 showed white count of 2.8, platelet count of 72 with ANC of 700.  Hemoglobin was normal.  LFTs were mildly elevated. -Follow-up CBC on 07/24/2018 shows white count of 1.6 with hemoglobin of 12.7 and platelet count of 72.  Aggregation of platelets was reported.  ANC was 0.6. -Follow-up CBC on 07/26/2018 shows white count of 1.5 with platelet count of 67 with ANC of 200.  Monocytes were 21%. -He denies starting any new medications in the past 6 months. -He reported lack of energy since 06/25/2018.  He also reported feeling bloated and decreased appetite.  He did not lose any weight. -He reported low-grade fevers since that time and had about 3-5 intense night sweats which were drenching.  He had minor night sweats more frequently.  He denies any new onset pains. -He denies any bleeding episodes or easy bruising.  Denies any tick bites.  He has a Programmer, systems which is an outside dog. 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  Denies any direct exposure to chemicals. -Family history was significant for father with prostate cancer and paternal grandfather with stomach cancer. -We will repeat his CBC with differential today and review his smear.  We will also check his LFTs to see if they have normalized.  We will check his reticulocyte count, LDH.  We will rule out nutritional deficiencies by checking B12, methylmalonic acid, folic acid and copper.  We will check for ANA and rheumatoid factor.  We will also check EBV serology, HIV, tickborne illness serology. -We will see him back in 1 week after the labs.  He does not have any palpable adenopathy or splenomegaly on physical exam. - If the above tests are nonconclusive, he will likely require bone marrow aspiration and biopsy. -I have reviewed previous CBC on epic.  He had mild thrombocytopenia since 2014,  however platelet clumping was seen.  Hence I will request a blue top tube for platelet count.

## 2018-08-02 NOTE — Progress Notes (Signed)
CONSULT NOTE  Patient Care Team: Elfredia NevinsFusco, Lawrence, MD as PCP - General (Internal Medicine)  CHIEF COMPLAINTS/PURPOSE OF CONSULTATION:  Severe neutropenia and moderate thrombocytopenia.  HISTORY OF PRESENTING ILLNESS:  [name] Kelley 33 y.o. male is seen in consultation today for further work-up and management of pancytopenia.  He reported lack of energy, increased bloating and decreased appetite since 06/25/2018.  He does not report taking any new medications in the last 6 months.  He did have some fevers which are low-grade and intense night sweats at least 3-5 episodes, requiring changing clothes.  Otherwise he had minimal night sweats on and off.  No weight loss was reported.  No new onset pains.  Routine blood work on 07/12/2018 at Dr. Scharlene GlossHall's office showed white count of 2.8, platelet count of 72 with ANC of 700.  Hemoglobin was normal.  AST and ALT and total bilirubin were mildly elevated.  He had repeat labs on 07/24/2018 which showed white count of 1.6 with platelet count of 72 and ANC of 600.  CBC on 07/26/2018 showed white count of 1.5, hemoglobin 13, platelet count 67 with ANC of 200.  He denies any bleeding problems or easy bruising.  He denies any recent tick bites.  He has 1 pet dog which lives outside.  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  He is helping people with home improvement lately.  Denies any direct exposure to chemicals.  Denies any personal or family history of connective tissue disorders.  Father had prostate cancer and paternal grandfather had stomach cancer.  Denies any infections in the past 6 months.  Appetite is 75%.  Energy levels are 25%.    MEDICAL HISTORY:  Past Medical History:  Diagnosis Date  . Medical history non-contributory     SURGICAL HISTORY: Past Surgical History:  Procedure Laterality Date  . Arm Manipulation Right 1995?  . INCISION AND DRAINAGE ABSCESS N/A 01/05/2013   Procedure: INCISION  AND DRAINAGE PERIANAL  ABSCESS;  Surgeon: Dalia HeadingMark A Jenkins, MD;  Location: [facility];  Service: General;  Laterality: N/A;  . ROTATOR CUFF REPAIR    . SHOULDER ARTHROSCOPY WITH OPEN ROTATOR CUFF REPAIR AND DISTAL CLAVICLE ACROMINECTOMY Left 11/02/2016   Procedure: LEFT SHOULDER ARTHROSCOPY WITH MINI OPEN ROTATOR CUFF REPAIR, OPEN BICEPS TENODESIS, AND DEBRIDEMENT;  Surgeon: Cammy Copaean, Gregory Scott, MD;  Location: [facility];  Service: Orthopedics;  Laterality: Left;  . SHOULDER ARTHROSCOPY WITH SUBACROMIAL DECOMPRESSION, ROTATOR CUFF REPAIR AND BICEP TENDON REPAIR Right 12/09/2015   Procedure: SHOULDER DIAGNOSTIC OPERATIVE ARTHROSCOPY, SUBACROMIAL DECOMPRESSION, DISTAL CLAVICLE EXCISION, MINI-OPEN ROTATOR CUFF REPAIR AND BICEP TENODESIS.;  Surgeon: Cammy CopaScott Gregory Dean, MD;  Location: [facility];  Service: Orthopedics;  Laterality: Right;  . widsom teeth  2017    SOCIAL HISTORY: Social History   Socioeconomic History  . Marital status: Married    Spouse name: Not on file  . Number of children: Not on file  . Years of education: Not on file  . Highest education level: Not on file  Occupational History  . Not on file  Social Needs  . Financial resource strain: Not on file  . Food insecurity    Worry: Not on file    Inability: Not on file  . Transportation needs    Medical: Not on file    Non-medical: Not on file  Tobacco Use  . Smoking status: Never Smoker  . Smokeless tobacco: Never Used  Substance and Sexual Activity  .  Alcohol use: No  . Drug use: No  . Sexual activity: Not on file  Lifestyle  . Physical activity    Days per week: Not on file    Minutes per session: Not on file  . Stress: Not on file  Relationships  . Social Musician on phone: Not on file    Gets together: Not on file    Attends religious service: Not on file    Active member of club or organization: Not on file    Attends meetings of clubs or organizations: Not on file    Relationship status: Not on file  .  Intimate partner violence    Fear of current or ex partner: Not on file    Emotionally abused: Not on file    Physically abused: Not on file    Forced sexual activity: Not on file  Other Topics Concern  . Not on file  Social History Narrative  . Not on file    FAMILY HISTORY: Family History  Problem Relation Age of Onset  . Prostate cancer Father     ALLERGIES:  is allergic to hydrocodone.  MEDICATIONS:  Current Outpatient Medications  Medication Sig Dispense Refill  . zolpidem (AMBIEN) 10 MG tablet Take 10 mg by mouth as needed.     Marland Kitchen HYDROcodone-acetaminophen (NORCO) 5-325 MG tablet Take 1 tablet by mouth every 8 (eight) hours as needed for moderate pain. (Patient not taking: Reported on 08/02/2018) 20 tablet 0  . methocarbamol (ROBAXIN) 500 MG tablet Take 1 tablet (500 mg total) by mouth every 8 (eight) hours as needed for muscle spasms. (Patient not taking: Reported on 03/29/2018) 30 tablet 0  . methocarbamol (ROBAXIN) 500 MG tablet Take 1 tablet (500 mg total) by mouth 4 (four) times daily. (Patient not taking: Reported on 08/01/2018) 30 tablet 0  . methylPREDNISolone (MEDROL DOSEPAK) 4 MG TBPK tablet FPD    . oxyCODONE (OXY IR/ROXICODONE) 5 MG immediate release tablet Take 1 tablet (5 mg total) by mouth every 4 (four) hours as needed for severe pain. (Patient not taking: Reported on 03/29/2018) 60 tablet 0   No current facility-administered medications for this visit.     REVIEW OF SYSTEMS:   Constitutional: Denies fevers, chills or abnormal night sweats Eyes: Denies blurriness of vision, double vision or watery eyes Ears, nose, mouth, throat, and face: Denies mucositis or sore throat Respiratory: Reports shortness of breath with bending. Cardiovascular: Denies palpitation, chest discomfort or lower extremity swelling Gastrointestinal:  Denies heartburn or change in bowel habits.  Reports nausea with eating. Skin: Denies abnormal skin rashes Lymphatics: Denies new  lymphadenopathy or easy bruising Neurological:Denies numbness, tingling or new weaknesses Behavioral/Psych: Mood is stable, no new changes  All other systems were reviewed with the patient and are negative.  PHYSICAL EXAMINATION: ECOG PERFORMANCE STATUS: 0 - Asymptomatic  Vitals:   08/02/18 1334  BP: 134/87  Pulse: 91  Resp: 18  Temp: 98.4 F (36.9 C)  SpO2: 98%   Filed Weights   08/02/18 1334  Weight: 264 lb (119.7 kg)    GENERAL:alert, no distress and comfortable SKIN: skin color, texture, turgor are normal, no rashes or significant lesions EYES: normal, conjunctiva are pink and non-injected, sclera clear OROPHARYNX:no exudate, no erythema and lips, buccal mucosa, and tongue normal  NECK: supple, thyroid normal size, non-tender, without nodularity LYMPH:  no palpable lymphadenopathy in the cervical, axillary or inguinal LUNGS: clear to auscultation and percussion with normal breathing effort HEART: regular rate &  rhythm and no murmurs and no lower extremity edema ABDOMEN:abdomen soft, non-tender and normal bowel sounds Musculoskeletal:no cyanosis of digits and no clubbing  PSYCH: alert & oriented x 3 with fluent speech NEURO: no focal motor/sensory deficits  LABORATORY DATA:  I have reviewed the data as listed Recent Results (from the past 2160 hour(s))  CBC with Differential/Platelet     Status: Abnormal   Collection Time: 08/02/18  2:42 PM  Result Value Ref Range   WBC 1.5 (L) 4.0 - 10.5 K/uL    Comment: WHITE COUNT CONFIRMED ON SMEAR   RBC 4.67 4.22 - 5.81 MIL/uL   Hemoglobin 13.5 13.0 - 17.0 g/dL   HCT 40.942.2 81.139.0 - 91.452.0 %   MCV 90.4 80.0 - 100.0 fL   MCH 28.9 26.0 - 34.0 pg   MCHC 32.0 30.0 - 36.0 g/dL   RDW 78.216.4 (H) 95.611.5 - 21.315.5 %   Platelets 80 (L) 150 - 400 K/uL    Comment: PLATELET COUNT CONFIRMED BY SMEAR SPECIMEN CHECKED FOR CLOTS Immature Platelet Fraction may be clinically indicated, consider ordering this additional test [id number]    nRBC 0.0 0.0  - 0.2 %   Neutrophils Relative % 38 %   Neutro Abs 0.6 (L) 1.7 - 7.7 K/uL   Lymphocytes Relative 46 %   Lymphs Abs 0.7 0.7 - 4.0 K/uL   Monocytes Relative 11 %   Monocytes Absolute 0.2 0.1 - 1.0 K/uL   Eosinophils Relative 3 %   Eosinophils Absolute 0.0 0.0 - 0.5 K/uL   Basophils Relative 1 %   Basophils Absolute 0.0 0.0 - 0.1 K/uL   Immature Granulocytes 1 %   Abs Immature Granulocytes 0.02 0.00 - 0.07 K/uL    Comment: Performed at [facility], [address] [place], [place] [address]  Comprehensive metabolic panel     Status: Abnormal   Collection Time: 08/02/18  2:42 PM  Result Value Ref Range   Sodium 140 135 - 145 mmol/L   Potassium 3.9 3.5 - 5.1 mmol/L   Chloride 109 98 - 111 mmol/L   CO2 25 22 - 32 mmol/L   Glucose, Bld 118 (H) 70 - 99 mg/dL   BUN 18 6 - 20 mg/dL   Creatinine, Ser 2.841.05 0.61 - 1.24 mg/dL   Calcium 8.8 (L) 8.9 - 10.3 mg/dL   Total Protein 6.8 6.5 - 8.1 g/dL   Albumin 3.9 3.5 - 5.0 g/dL   AST 35 15 - 41 U/L   ALT 43 0 - 44 U/L   Alkaline Phosphatase 68 38 - 126 U/L   Total Bilirubin 1.0 0.3 - 1.2 mg/dL   GFR calc non Af Amer >60 >60 mL/min   GFR calc Af Amer >60 >60 mL/min   Anion gap 6 5 - 15    Comment: Performed at [facility], [address] [place], [place] [address]  Reticulocytes     Status: None   Collection Time: 08/02/18  2:42 PM  Result Value Ref Range   Retic Ct Pct 2.7 0.4 - 3.1 %   RBC. 4.67 4.22 - 5.81 MIL/uL   Retic Count, Absolute 128.0 19.0 - 186.0 K/uL   Immature Retic Fract 7.7 2.3 - 15.9 %    Comment: Performed at [facility], [address] [place], [place] [address]  Lactate dehydrogenase     Status: None   Collection Time: 08/02/18  2:42 PM  Result Value Ref Range   LDH 154 98 - 192 U/L    Comment: Performed  at [facility], [address], [place], [place] [address]  Sedimentation rate     Status: None   Collection Time: 08/02/18  2:42 PM  Result Value Ref Range   Sed Rate 1 0 - 16 mm/hr    Comment: Performed at  [facility], [address] [place], [place] [address]  Iron and TIBC     Status: None   Collection Time: 08/02/18  2:43 PM  Result Value Ref Range   Iron 83 45 - 182 ug/dL   TIBC 298 250 - 450 ug/dL   Saturation Ratios 28 17.9 - 39.5 %   UIBC 215 ug/dL    Comment: Performed at [facility], [address], [place], [place] [address]  Ferritin     Status: None   Collection Time: 08/02/18  2:43 PM  Result Value Ref Range   Ferritin 104 24 - 336 ng/mL    Comment: Performed at [facility], [address], [place], [place] [address]  Vitamin B12     Status: None   Collection Time: 08/02/18  2:43 PM  Result Value Ref Range   Vitamin B-12 669 180 - 914 pg/mL    Comment: (NOTE) This assay is not validated for testing neonatal or myeloproliferative syndrome specimens for Vitamin B12 levels. Performed at [facility], [address] [place], [place] [address]   Folate     Status: None   Collection Time: 08/02/18  2:43 PM  Result Value Ref Range   Folate 12.2 >5.9 ng/mL    Comment: Performed at [facility], [address] [place], [place] [address]  C-reactive protein     Status: Abnormal   Collection Time: 08/02/18  2:43 PM  Result Value Ref Range   CRP 1.0 (H) <1.0 mg/dL    Comment: Performed at [facility], [address], [place], [place] [address]    RADIOGRAPHIC STUDIES: I have personally reviewed the radiological images as listed and agreed with the findings in the report.  ASSESSMENT & PLAN:  Other pancytopenia ([place]) 1.  Severe neutropenia and moderate thrombocytopenia: - Routine labs done at Dr. Juel Burrow office on 07/12/2018 showed white count of 2.8, platelet count of 72 with ANC of 700.  Hemoglobin was normal.  LFTs were mildly elevated. -Follow-up CBC on 07/24/2018 shows white count of 1.6 with hemoglobin of 12.7 and platelet count of 72.  Aggregation of platelets was reported.  ANC was 0.6. -Follow-up CBC on 07/26/2018 shows white count of 1.5 with platelet count of 67 with  ANC of 200.  Monocytes were 21%. -He denies starting any new medications in the past 6 months. -He reported lack of energy since 06/25/2018.  He also reported feeling bloated and decreased appetite.  He did not lose any weight. -He reported low-grade fevers since that time and had about 3-5 intense night sweats which were drenching.  He had minor night sweats more frequently.  He denies any new onset pains. -He denies any bleeding episodes or easy bruising.  Denies any tick bites.  He has a Programmer, systems which is an outside dog. 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  Denies any direct exposure to chemicals. -Family history was significant for father with prostate cancer and paternal grandfather with stomach cancer. -We will repeat his CBC with differential today and review his smear.  We will also check his LFTs to see if they have normalized.  We will check his reticulocyte count, LDH.  We will rule out nutritional deficiencies by checking B12, methylmalonic acid, folic acid and copper.  We will check for ANA and rheumatoid factor.  We will also check EBV serology, HIV, tickborne illness serology. -We will see him back in 1 week after the labs.  He does not have any palpable adenopathy or splenomegaly on physical exam. - If the above tests are nonconclusive, he will likely require bone marrow aspiration and biopsy. -I have reviewed previous CBC on epic.  He had mild thrombocytopenia since 2014, however platelet clumping was seen.  Hence I will request a blue top tube for platelet count.     All questions were answered. The patient knows to call the clinic with any problems, questions or concerns.      Doreatha MassedSreedhar Toddrick Sanna, MD 08/02/18 4:58 PM

## 2018-08-03 LAB — ANTINUCLEAR ANTIBODIES, IFA: ANA Ab, IFA: NEGATIVE

## 2018-08-03 LAB — RHEUMATOID FACTOR: Rheumatoid fact SerPl-aCnc: <10 IU/mL (ref 0.0–13.9)

## 2018-08-03 LAB — PATHOLOGIST SMEAR REVIEW

## 2018-08-03 LAB — HIV ANTIBODY (ROUTINE TESTING W REFLEX): HIV Screen 4th Generation wRfx: NONREACTIVE

## 2018-08-04 LAB — COPPER, SERUM: Copper: 154 ug/dL (ref 72–166)

## 2018-08-05 LAB — METHYLMALONIC ACID, SERUM: Methylmalonic Acid, Quantitative: 219 nmol/L (ref 0–378)

## 2018-08-08 ENCOUNTER — Encounter (HOSPITAL_COMMUNITY): Payer: Self-pay | Admitting: Hematology

## 2018-08-08 ENCOUNTER — Other Ambulatory Visit: Payer: Self-pay

## 2018-08-08 ENCOUNTER — Inpatient Hospital Stay (HOSPITAL_BASED_OUTPATIENT_CLINIC_OR_DEPARTMENT_OTHER): Payer: 59 | Admitting: Hematology

## 2018-08-08 VITALS — BP 128/79 | HR 92 | Temp 98.5°F | Resp 18 | Wt 258.6 lb

## 2018-08-08 DIAGNOSIS — R0609 Other forms of dyspnea: Secondary | ICD-10-CM

## 2018-08-08 DIAGNOSIS — R140 Abdominal distension (gaseous): Secondary | ICD-10-CM

## 2018-08-08 DIAGNOSIS — R7989 Other specified abnormal findings of blood chemistry: Secondary | ICD-10-CM

## 2018-08-08 DIAGNOSIS — R630 Anorexia: Secondary | ICD-10-CM | POA: Diagnosis not present

## 2018-08-08 DIAGNOSIS — R61 Generalized hyperhidrosis: Secondary | ICD-10-CM | POA: Diagnosis not present

## 2018-08-08 DIAGNOSIS — D61818 Other pancytopenia: Secondary | ICD-10-CM | POA: Diagnosis not present

## 2018-08-08 DIAGNOSIS — Z8042 Family history of malignant neoplasm of prostate: Secondary | ICD-10-CM | POA: Diagnosis not present

## 2018-08-08 DIAGNOSIS — Z79899 Other long term (current) drug therapy: Secondary | ICD-10-CM | POA: Diagnosis not present

## 2018-08-08 DIAGNOSIS — R5383 Other fatigue: Secondary | ICD-10-CM

## 2018-08-08 NOTE — Progress Notes (Signed)
Patient Care Team: Redmond School, MD as PCP - General (Internal Medicine)  DIAGNOSIS:  Encounter Diagnosis  Name Primary?  . Other pancytopenia ([place]) Yes    SUMMARY OF ONCOLOGIC HISTORY: Oncology History   No history exists.    CHIEF COMPLIANT: Pancytopenia.  INTERVAL HISTORY: [name] Kelley is a 33 y.o. white male who is seen for follow-up of pancytopenia.  His lab results showed decrease in white count and platelet count since May 2020.  He denies any new medications.  No over-the-counter or herbal supplements.  Lack of energy since May 10 was reported.  Denies any tick bites.  Denies any bleeding or easy bruising.  No direct exposure to chemicals.  No family history of leukemias or lymphomas.  No recent ER visits or hospitalizations.  He does report some shortness of breath with activity.  REVIEW OF SYSTEMS:   Constitutional: Denies fevers, chills or abnormal weight loss Eyes: Denies blurriness of vision Ears, nose, mouth, throat, and face: Denies mucositis or sore throat Respiratory: Denies cough, dyspnea or wheezes.  Dyspnea on exertion present. Cardiovascular: Denies palpitation, chest discomfort Gastrointestinal:  Denies nausea, heartburn or change in bowel habits Skin: Denies abnormal skin rashes Lymphatics: Denies new lymphadenopathy or easy bruising Neurological:Denies numbness, tingling or new weaknesses Behavioral/Psych: Mood is stable, no new changes  Extremities: No lower extremity edema All other systems were reviewed with the patient and are negative.  I have reviewed the past medical history, past surgical history, social history and family history with the patient and they are unchanged from previous note.  ALLERGIES:  is allergic to hydrocodone.  MEDICATIONS:  Current Outpatient Medications  Medication Sig Dispense Refill  . zolpidem (AMBIEN) 10 MG tablet Take 10 mg by mouth as needed.     Marland Kitchen HYDROcodone-acetaminophen (NORCO) 5-325 MG tablet Take 1  tablet by mouth every 8 (eight) hours as needed for moderate pain. (Patient not taking: Reported on 08/02/2018) 20 tablet 0  . methocarbamol (ROBAXIN) 500 MG tablet Take 1 tablet (500 mg total) by mouth every 8 (eight) hours as needed for muscle spasms. (Patient not taking: Reported on 03/29/2018) 30 tablet 0  . methocarbamol (ROBAXIN) 500 MG tablet Take 1 tablet (500 mg total) by mouth 4 (four) times daily. (Patient not taking: Reported on 08/01/2018) 30 tablet 0  . oxyCODONE (OXY IR/ROXICODONE) 5 MG immediate release tablet Take 1 tablet (5 mg total) by mouth every 4 (four) hours as needed for severe pain. (Patient not taking: Reported on 03/29/2018) 60 tablet 0   No current facility-administered medications for this visit.     PHYSICAL EXAMINATION: ECOG PERFORMANCE STATUS: 1 - Symptomatic but completely ambulatory  Vitals:   08/08/18 1558  BP: 128/79  Pulse: 92  Resp: 18  Temp: 98.5 F (36.9 C)  SpO2: 98%   Filed Weights   08/08/18 1558  Weight: 258 lb 9.6 oz (117.3 kg)    GENERAL:alert, no distress and comfortable SKIN: skin color, texture, turgor are normal, no rashes or significant lesions EYES: normal, Conjunctiva are pink and non-injected, sclera clear OROPHARYNX:no mucositis, no erythema and lips, buccal mucosa, and tongue normal  NECK: supple, thyroid normal size, non-tender, without nodularity LYMPH:  no palpable lymphadenopathy in the cervical, axillary or inguinal LUNGS: clear to auscultation and percussion with normal breathing effort HEART: regular rate & rhythm and no murmurs and no lower extremity edema ABDOMEN:abdomen soft, non-tender and normal bowel sounds MUSCULOSKELETAL:no cyanosis of digits and no clubbing  EXTREMITIES: No lower extremity edema  LABORATORY DATA:  I have reviewed the data as listed CMP Latest Ref Rng & Units 08/02/2018 12/03/2015 01/04/2013  Glucose 70 - 99 mg/dL 118(H) 92 88  BUN 6 - 20 mg/dL _0 Creatinine 0.61 - 1.24 mg/dL 1.05  1.15 1.08  Sodium 135 - 145 mmol/L 140 138 139  Potassium 3.5 - 5.1 mmol/L 3.9 4.4 3.8  Chloride 98 - 111 mmol/L 109 106 104  CO2 22 - 32 mmol/L _1 Calcium 8.9 - 10.3 mg/dL 8.8(L) 9.0 9.3  Total Protein 6.5 - 8.1 g/dL 6.8 - -  Total Bilirubin 0.3 - 1.2 mg/dL 1.0 - -  Alkaline Phos 38 - 126 U/L 68 - -  AST 15 - 41 U/L 35 - -  ALT 0 - 44 U/L 43 - -   No results found for: POE423   Lab Results  Component Value Date   WBC 1.5 (L) 08/02/2018   HGB 13.5 08/02/2018   HCT 42.2 08/02/2018   MCV 90.4 08/02/2018   PLT 80 (L) 08/02/2018   NEUTROABS 0.6 (L) 08/02/2018    ASSESSMENT & PLAN:  Other pancytopenia ([place]) 1.  Severe neutropenia and moderate thrombocytopenia: - Routine labs done at Dr. Juel Burrow office on 07/12/2018 showed white count of 2.8, platelet count of 72 with ANC of 700.  Hemoglobin was normal.  LFTs were mildly elevated. -Follow-up CBC on 07/24/2018 shows white count of 1.6 with hemoglobin of 12.7 and platelet count of 72.  Aggregation of platelets was reported.  ANC was 0.6. -Follow-up CBC on 07/26/2018 shows white count of 1.5 with platelet count of 67 with ANC of 200.  Monocytes were 21%. -He denies starting any new medications in the past 6 months. -He reported lack of energy since 06/25/2018.  He also reported feeling bloated and decreased appetite.  He did not lose any weight. -He reported low-grade fevers since that time and had about 3-5 intense night sweats which were drenching.  He had minor night sweats more frequently.  He denies any new onset pains. -He denies any bleeding episodes or easy bruising.  Denies any tick bites.  He has a Programmer, systems which is an outside dog. 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  Denies any direct exposure to chemicals. -Family history was significant for father with prostate cancer and paternal grandfather with stomach cancer. -He does not have any palpable adenopathy or  splenomegaly on physical exam. - I have reviewed his blood work.  WBC is 1.5 with ANC of 600.  Platelet count is 80.  LDH, ESR and CRP were normal. -Connective tissue disorder work-up including ANA and rheumatoid factor were negative.  Nutritional work-up including N36, folic acid, methylmalonic acid, copper levels are normal. -We had a prolonged discussion including the neck step in the work-up with a bone marrow biopsy.  We will schedule it. - We will also send tickborne infections panel and EBV panel.     Orders Placed This Encounter  Procedures  . CBC with Differential    Standing Status:   Future    Standing Expiration Date:   08/08/2019   The patient has a good understanding of the overall plan. he agrees with it. he will call with any problems that may develop before the next visit here.  Derek Jack, MD 08/08/18

## 2018-08-08 NOTE — Assessment & Plan Note (Signed)
1.  Severe neutropenia and moderate thrombocytopenia: - Routine labs done at Dr. Juel Burrow office on 07/12/2018 showed white count of 2.8, platelet count of 72 with ANC of 700.  Hemoglobin was normal.  LFTs were mildly elevated. -Follow-up CBC on 07/24/2018 shows white count of 1.6 with hemoglobin of 12.7 and platelet count of 72.  Aggregation of platelets was reported.  ANC was 0.6. -Follow-up CBC on 07/26/2018 shows white count of 1.5 with platelet count of 67 with ANC of 200.  Monocytes were 21%. -He denies starting any new medications in the past 6 months. -He reported lack of energy since 06/25/2018.  He also reported feeling bloated and decreased appetite.  He did not lose any weight. -He reported low-grade fevers since that time and had about 3-5 intense night sweats which were drenching.  He had minor night sweats more frequently.  He denies any new onset pains. -He denies any bleeding episodes or easy bruising.  Denies any tick bites.  He has a Programmer, systems which is an outside dog. 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  Denies any direct exposure to chemicals. -Family history was significant for father with prostate cancer and paternal grandfather with stomach cancer. -He does not have any palpable adenopathy or splenomegaly on physical exam. - I have reviewed his blood work.  WBC is 1.5 with ANC of 600.  Platelet count is 80.  LDH, ESR and CRP were normal. -Connective tissue disorder work-up including ANA and rheumatoid factor were negative.  Nutritional work-up including W38, folic acid, methylmalonic acid, copper levels are normal. -We had a prolonged discussion including the neck step in the work-up with a bone marrow biopsy.  We will schedule it. - We will also send tickborne infections panel and EBV panel.

## 2018-08-09 ENCOUNTER — Inpatient Hospital Stay (HOSPITAL_COMMUNITY): Payer: 59

## 2018-08-09 ENCOUNTER — Other Ambulatory Visit (HOSPITAL_COMMUNITY): Payer: Self-pay | Admitting: Hematology

## 2018-08-09 ENCOUNTER — Other Ambulatory Visit (HOSPITAL_COMMUNITY): Payer: Self-pay | Admitting: *Deleted

## 2018-08-09 ENCOUNTER — Ambulatory Visit (HOSPITAL_COMMUNITY): Payer: 59 | Admitting: Hematology

## 2018-08-09 ENCOUNTER — Other Ambulatory Visit: Payer: Self-pay

## 2018-08-09 DIAGNOSIS — R61 Generalized hyperhidrosis: Secondary | ICD-10-CM | POA: Diagnosis not present

## 2018-08-09 DIAGNOSIS — D61818 Other pancytopenia: Secondary | ICD-10-CM | POA: Diagnosis not present

## 2018-08-09 DIAGNOSIS — Z8042 Family history of malignant neoplasm of prostate: Secondary | ICD-10-CM | POA: Diagnosis not present

## 2018-08-09 DIAGNOSIS — R7989 Other specified abnormal findings of blood chemistry: Secondary | ICD-10-CM | POA: Diagnosis not present

## 2018-08-09 DIAGNOSIS — R0609 Other forms of dyspnea: Secondary | ICD-10-CM | POA: Diagnosis not present

## 2018-08-09 DIAGNOSIS — D696 Thrombocytopenia, unspecified: Secondary | ICD-10-CM

## 2018-08-09 DIAGNOSIS — R140 Abdominal distension (gaseous): Secondary | ICD-10-CM | POA: Diagnosis not present

## 2018-08-09 DIAGNOSIS — R5383 Other fatigue: Secondary | ICD-10-CM | POA: Diagnosis not present

## 2018-08-09 DIAGNOSIS — Z79899 Other long term (current) drug therapy: Secondary | ICD-10-CM | POA: Diagnosis not present

## 2018-08-09 DIAGNOSIS — R630 Anorexia: Secondary | ICD-10-CM | POA: Diagnosis not present

## 2018-08-09 LAB — PLATELET COUNT: Platelets: 66 10*3/uL — ABNORMAL LOW (ref 150–400)

## 2018-08-09 NOTE — Addendum Note (Signed)
Addended by: Derek Jack on: 08/09/2018 08:09 AM   Modules accepted: Orders

## 2018-08-10 LAB — EPSTEIN-BARR VIRUS (EBV) ANTIBODY PROFILE
EBV NA IgG: >600 U/mL — ABNORMAL HIGH (ref 0.0–17.9)
EBV VCA IgG: >600 U/mL — ABNORMAL HIGH (ref 0.0–17.9)
EBV VCA IgM: 145 U/mL — ABNORMAL HIGH (ref 0.0–35.9)

## 2018-08-11 ENCOUNTER — Other Ambulatory Visit: Payer: Self-pay | Admitting: Physician Assistant

## 2018-08-11 LAB — COMP PANEL: LEUKEMIA/LYMPHOMA

## 2018-08-11 LAB — MISC LABCORP TEST (SEND OUT): Labcorp test code: 345992

## 2018-08-14 ENCOUNTER — Other Ambulatory Visit: Payer: Self-pay | Admitting: Radiology

## 2018-08-15 ENCOUNTER — Ambulatory Visit (HOSPITAL_COMMUNITY)
Admission: RE | Admit: 2018-08-15 | Discharge: 2018-08-15 | Disposition: A | Discharge status: Home / Self Care | Payer: 59 | Source: Ambulatory Visit | Attending: Hematology | Admitting: Hematology

## 2018-08-15 ENCOUNTER — Other Ambulatory Visit: Payer: Self-pay

## 2018-08-15 ENCOUNTER — Encounter (HOSPITAL_COMMUNITY): Payer: Self-pay

## 2018-08-15 DIAGNOSIS — D696 Thrombocytopenia, unspecified: Secondary | ICD-10-CM | POA: Diagnosis not present

## 2018-08-15 DIAGNOSIS — D61818 Other pancytopenia: Secondary | ICD-10-CM | POA: Diagnosis not present

## 2018-08-15 LAB — CBC WITH DIFFERENTIAL/PLATELET
Abs Immature Granulocytes: 0.01 10*3/uL (ref 0.00–0.07)
Basophils Absolute: 0 10*3/uL (ref 0.0–0.1)
Basophils Relative: 1 %
Eosinophils Absolute: 0.1 10*3/uL (ref 0.0–0.5)
Eosinophils Relative: 4 %
HCT: 43.6 % (ref 39.0–52.0)
Hemoglobin: 14.4 g/dL (ref 13.0–17.0)
Immature Granulocytes: 1 %
Lymphocytes Relative: 38 %
Lymphs Abs: 0.6 10*3/uL — ABNORMAL LOW (ref 0.7–4.0)
MCH: 29.6 pg (ref 26.0–34.0)
MCHC: 33 g/dL (ref 30.0–36.0)
MCV: 89.7 fL (ref 80.0–100.0)
Monocytes Absolute: 0.3 10*3/uL (ref 0.1–1.0)
Monocytes Relative: 17 %
Neutro Abs: 0.6 10*3/uL — ABNORMAL LOW (ref 1.7–7.7)
Neutrophils Relative %: 39 %
Platelets: 75 10*3/uL — ABNORMAL LOW (ref 150–400)
RBC: 4.86 MIL/uL (ref 4.22–5.81)
RDW: 15 % (ref 11.5–15.5)
WBC: 1.6 10*3/uL — ABNORMAL LOW (ref 4.0–10.5)
nRBC: 0 % (ref 0.0–0.2)

## 2018-08-15 LAB — PROTIME-INR
INR: 1.1 (ref 0.8–1.2)
Prothrombin Time: 13.7 seconds (ref 11.4–15.2)

## 2018-08-15 LAB — APTT: aPTT: 31 seconds (ref 24–36)

## 2018-08-15 MED ORDER — MIDAZOLAM HCL 2 MG/2ML IJ SOLN
INTRAMUSCULAR | Status: AC | PRN
  Administered 2018-08-15 (×2): 1 mg via INTRAVENOUS
  Administered 2018-08-15: 2 mg via INTRAVENOUS

## 2018-08-15 MED ORDER — FENTANYL CITRATE (PF) 100 MCG/2ML IJ SOLN
INTRAMUSCULAR | Status: AC
  Filled 2018-08-15: qty 2

## 2018-08-15 MED ORDER — MIDAZOLAM HCL 2 MG/2ML IJ SOLN
INTRAMUSCULAR | Status: AC
  Filled 2018-08-15: qty 4

## 2018-08-15 MED ORDER — FENTANYL CITRATE (PF) 100 MCG/2ML IJ SOLN
INTRAMUSCULAR | Status: AC | PRN
  Administered 2018-08-15 (×2): 50 ug via INTRAVENOUS

## 2018-08-15 MED ORDER — LIDOCAINE HCL (PF) 1 % IJ SOLN
INTRAMUSCULAR | Status: AC | PRN
  Administered 2018-08-15: 10 mL

## 2018-08-15 MED ORDER — SODIUM CHLORIDE 0.9 % IV SOLN
INTRAVENOUS | Status: DC
  Administered 2018-08-15: 08:00:00 via INTRAVENOUS

## 2018-08-15 NOTE — Discharge Instructions (Signed)
Bone Marrow Aspiration and Bone Marrow Biopsy, Adult, Care After °This sheet gives you information about how to care for yourself after your procedure. Your health care provider may also give you more specific instructions. If you have problems or questions, contact your health care provider. °What can I expect after the procedure? °After the procedure, it is common to have: °· Mild pain and tenderness. °· Swelling. °· Bruising. °Follow these instructions at home: °Puncture site care ° °  ° °· Follow instructions from your health care provider about how to take care of the puncture site. Make sure you: °? Wash your hands with soap and water before you change your bandage (dressing). If soap and water are not available, use hand sanitizer. °? Change your dressing as told by your health care provider. °· Check your puncture site every day for signs of infection. Check for: °? More redness, swelling, or pain. °? More fluid or blood. °? Warmth. °? Pus or a bad smell. °General instructions °· Take over-the-counter and prescription medicines only as told by your health care provider. °· Do not take baths, swim, or use a hot tub until your health care provider approves. Ask if you can take a shower or have a sponge bath. °· Return to your normal activities as told by your health care provider. Ask your health care provider what activities are safe for you. °· Do not drive for 24 hours if you were given a medicine to help you relax (sedative) during your procedure. °· Keep all follow-up visits as told by your health care provider. This is important. °Contact a health care provider if: °· Your pain is not controlled with medicine. °Get help right away if: °· You have a fever. °· You have more redness, swelling, or pain around the puncture site. °· You have more fluid or blood coming from the puncture site. °· Your puncture site feels warm to the touch. °· You have pus or a bad smell coming from the puncture site. °These  symptoms may represent a serious problem that is an emergency. Do not wait to see if the symptoms will go away. Get medical help right away. Call your local emergency services (911 in the [place] Do not drive yourself to the hospital. °Summary °· After the procedure, it is common to have mild pain, tenderness, swelling, and bruising. °· Follow instructions from your health care provider about how to take care of the puncture site. °· Get help right away if you have any symptoms of infection or if you have more blood or fluid coming from the puncture site. °This information is not intended to replace advice given to you by your health care provider. Make sure you discuss any questions you have with your health care provider. °Document Released: 08/21/2004 Document Revised: 05/17/2017 Document Reviewed: 07/16/2015 °Elsevier Patient Education © 2020 Elsevier Inc[employer] ° ° ° ° °Moderate Conscious Sedation, Adult, Care After °These instructions provide you with information about caring for yourself after your procedure. Your health care provider may also give you more specific instructions. Your treatment has been planned according to current medical practices, but problems sometimes occur. Call your health care provider if you have any problems or questions after your procedure. °What can I expect after the procedure? °After your procedure, it is common: °· To feel sleepy for several hours. °· To feel clumsy and have poor balance for several hours. °· To have poor judgment for several hours. °· To vomit if you eat too soon. °  Follow these instructions at home: °For at least 24 hours after the procedure: ° °· Do not: °? Participate in activities where you could fall or become injured. °? Drive. °? Use heavy machinery. °? Drink alcohol. °? Take sleeping pills or medicines that cause drowsiness. °? Make important decisions or sign legal documents. °? Take care of children on your own. °· Rest. °Eating and drinking °· Follow the  diet recommended by your health care provider. °· If you vomit: °? Drink water, juice, or soup when you can drink without vomiting. °? Make sure you have little or no nausea before eating solid foods. °General instructions °· Have a responsible adult stay with you until you are awake and alert. °· Take over-the-counter and prescription medicines only as told by your health care provider. °· If you smoke, do not smoke without supervision. °· Keep all follow-up visits as told by your health care provider. This is important. °Contact a health care provider if: °· You keep feeling nauseous or you keep vomiting. °· You feel light-headed. °· You develop a rash. °· You have a fever. °Get help right away if: °· You have trouble breathing. °This information is not intended to replace advice given to you by your health care provider. Make sure you discuss any questions you have with your health care provider. °Document Released: 11/22/2012 Document Revised: 01/14/2017 Document Reviewed: 05/24/2015 °Elsevier Patient Education © 2020 Elsevier Inc. ° °

## 2018-08-15 NOTE — Consult Note (Addendum)
Chief Complaint: Patient was seen in consultation today for CT guided bone marrow biopsy  Referring Physician(s): Katragadda,Sreedhar  Supervising Physician: Sandi Mariscal  Patient Status: [facility] - Out-pt  History of Present Illness: [name] Kelley is a 33 y.o. male with history of pancytopenia of unknown etiology who presents today for CT-guided bone marrow biopsy for further evaluation.  Past Medical History:  Diagnosis Date  . Medical history non-contributory     Past Surgical History:  Procedure Laterality Date  . Arm Manipulation Right 1995?  . INCISION AND DRAINAGE ABSCESS N/A 01/05/2013   Procedure: INCISION AND DRAINAGE PERIANAL  ABSCESS;  Surgeon: Jamesetta So, MD;  Location: [facility];  Service: General;  Laterality: N/A;  . ROTATOR CUFF REPAIR    . SHOULDER ARTHROSCOPY WITH OPEN ROTATOR CUFF REPAIR AND DISTAL CLAVICLE ACROMINECTOMY Left 11/02/2016   Procedure: LEFT SHOULDER ARTHROSCOPY WITH MINI OPEN ROTATOR CUFF REPAIR, OPEN BICEPS TENODESIS, AND DEBRIDEMENT;  Surgeon: Meredith Pel, MD;  Location: [place];  Service: Orthopedics;  Laterality: Left;  . SHOULDER ARTHROSCOPY WITH SUBACROMIAL DECOMPRESSION, ROTATOR CUFF REPAIR AND BICEP TENDON REPAIR Right 12/09/2015   Procedure: SHOULDER DIAGNOSTIC OPERATIVE ARTHROSCOPY, SUBACROMIAL DECOMPRESSION, DISTAL CLAVICLE EXCISION, MINI-OPEN ROTATOR CUFF REPAIR AND BICEP TENODESIS.;  Surgeon: Meredith Pel, MD;  Location: [place];  Service: Orthopedics;  Laterality: Right;  . widsom teeth  2017    Allergies: Hydrocodone  Medications: Prior to Admission medications   Medication Sig Start Date End Date Taking? Authorizing Provider  HYDROcodone-acetaminophen (NORCO) 5-325 MG tablet Take 1 tablet by mouth every 8 (eight) hours as needed for moderate pain. Patient not taking: Reported on 08/02/2018 03/29/18   Meredith Pel, MD  methocarbamol (ROBAXIN) 500 MG tablet Take 1 tablet (500 mg total) by mouth every 8 (eight)  hours as needed for muscle spasms. Patient not taking: Reported on 03/29/2018 11/02/16   Meredith Pel, MD  methocarbamol (ROBAXIN) 500 MG tablet Take 1 tablet (500 mg total) by mouth 4 (four) times daily. Patient not taking: Reported on 08/01/2018 03/29/18   Meredith Pel, MD  oxyCODONE (OXY IR/ROXICODONE) 5 MG immediate release tablet Take 1 tablet (5 mg total) by mouth every 4 (four) hours as needed for severe pain. Patient not taking: Reported on 03/29/2018 11/02/16   Meredith Pel, MD  zolpidem (AMBIEN) 10 MG tablet Take 10 mg by mouth as needed.  05/01/18   [provider]     Family History  Problem Relation Age of Onset  . Prostate cancer Father     Social History   Socioeconomic History  . Marital status: Married    Spouse name: Not on file  . Number of children: Not on file  . Years of education: Not on file  . Highest education level: Not on file  Occupational History  . Not on file  Social Needs  . Financial resource strain: Not on file  . Food insecurity    Worry: Not on file    Inability: Not on file  . Transportation needs    Medical: Not on file    Non-medical: Not on file  Tobacco Use  . Smoking status: Never Smoker  . Smokeless tobacco: Never Used  Substance and Sexual Activity  . Alcohol use: No  . Drug use: No  . Sexual activity: Not on file  Lifestyle  . Physical activity    Days per week: Not on file    Minutes per session: Not on file  . Stress:  Not on file  Relationships  . Social Herbalist on phone: Not on file    Gets together: Not on file    Attends religious service: Not on file    Active member of club or organization: Not on file    Attends meetings of clubs or organizations: Not on file    Relationship status: Not on file  Other Topics Concern  . Not on file  Social History Narrative  . Not on file      Review of Systems denies fever, headache, chest pain, dyspnea, cough, abdominal/back pain,  nausea, vomiting or bleeding.  He has had fatigue, occ night sweats.  Vital Signs: BP (!) 141/93 (BP Location: Right Arm)   Pulse 64   Temp 97.7 F (36.5 C) (Oral)   Resp 18   Ht _0  (1.854 m)   Wt 258 lb (117 kg)   SpO2 100%   BMI 34.04 kg/m   Physical Exam awake, alert.  Chest clear to auscultation bilaterally.  Heart with regular rate and rhythm.  Abdomen soft, positive bowel sounds, nontender.  No lower extremity edema.  Imaging: No results found.  Labs:  CBC: Recent Labs    08/02/18 1442 08/09/18 0927 08/15/18 0741  WBC 1.5*  --  1.6*  HGB 13.5  --  14.4  HCT 42.2  --  43.6  PLT 80* 66* 75*    COAGS: Recent Labs    08/15/18 0741  INR 1.1  APTT 31    BMP: Recent Labs    08/02/18 1442  NA 140  K 3.9  CL 109  CO2 25  GLUCOSE 118*  BUN 18  CALCIUM 8.8*  CREATININE 1.05  GFRNONAA >60  GFRAA >60    LIVER FUNCTION TESTS: Recent Labs    08/02/18 1442  BILITOT 1.0  AST 35  ALT 43  ALKPHOS 68  PROT 6.8  ALBUMIN 3.9    TUMOR MARKERS: No results for input(s): AFPTM, CEA, CA199, CHROMGRNA in the last 8760 hours.  Assessment and Plan: 33 y.o. male with history of pancytopenia of unknown etiology who presents today for CT-guided bone marrow biopsy for further evaluation.Risks and benefits of procedure was discussed with the patient  including, but not limited to bleeding, infection, damage to adjacent structures or low yield requiring additional tests.  All of the questions were answered and there is agreement to proceed.  Consent signed and in chart.     Thank you for this interesting consult.  I greatly enjoyed meeting [name] Kelley and look forward to participating in their care.  A copy of this report was sent to the requesting provider on this date.  Electronically Signed: D. Rowe Robert, PA-C 08/15/2018, 8:48 AM   I spent a total of 20 minutes in face to face in clinical consultation, greater than 50% of which was  counseling/coordinating care for CT-guided bone marrow biopsy

## 2018-08-15 NOTE — Procedures (Signed)
Pre-procedure Diagnosis: Pancytopenia Post-procedure Diagnosis: Same  Technically successful CT guided bone marrow aspiration and biopsy of left iliac crest.   Complications: None Immediate  EBL: None  Signed: Sandi Mariscal Pager: [phone] 08/15/2018, 10:03 AM

## 2018-08-16 ENCOUNTER — Ambulatory Visit (HOSPITAL_COMMUNITY): Payer: 59 | Admitting: Hematology

## 2018-08-16 ENCOUNTER — Other Ambulatory Visit (HOSPITAL_COMMUNITY): Payer: 59

## 2018-08-16 ENCOUNTER — Ambulatory Visit (HOSPITAL_COMMUNITY): Payer: 59

## 2018-08-16 ENCOUNTER — Encounter (HOSPITAL_COMMUNITY): Payer: 59 | Admitting: Hematology

## 2018-08-22 ENCOUNTER — Encounter (HOSPITAL_COMMUNITY): Payer: Self-pay | Admitting: Hematology

## 2018-08-30 ENCOUNTER — Encounter (HOSPITAL_COMMUNITY): Payer: Self-pay | Admitting: Hematology

## 2018-08-30 ENCOUNTER — Inpatient Hospital Stay (HOSPITAL_COMMUNITY): Payer: 59 | Attending: Hematology | Admitting: Hematology

## 2018-08-30 ENCOUNTER — Other Ambulatory Visit: Payer: Self-pay

## 2018-08-30 ENCOUNTER — Inpatient Hospital Stay (HOSPITAL_COMMUNITY): Payer: 59

## 2018-08-30 VITALS — BP 122/78 | HR 89 | Temp 98.2°F | Resp 16 | Wt 264.7 lb

## 2018-08-30 DIAGNOSIS — Z79899 Other long term (current) drug therapy: Secondary | ICD-10-CM

## 2018-08-30 DIAGNOSIS — Z8042 Family history of malignant neoplasm of prostate: Secondary | ICD-10-CM | POA: Diagnosis not present

## 2018-08-30 DIAGNOSIS — D61818 Other pancytopenia: Secondary | ICD-10-CM

## 2018-08-30 DIAGNOSIS — M19011 Primary osteoarthritis, right shoulder: Secondary | ICD-10-CM | POA: Diagnosis not present

## 2018-08-30 DIAGNOSIS — D696 Thrombocytopenia, unspecified: Secondary | ICD-10-CM

## 2018-08-30 DIAGNOSIS — R200 Anesthesia of skin: Secondary | ICD-10-CM

## 2018-08-30 LAB — CBC WITH DIFFERENTIAL/PLATELET
Abs Immature Granulocytes: 0.01 10*3/uL (ref 0.00–0.07)
Basophils Absolute: 0 10*3/uL (ref 0.0–0.1)
Basophils Relative: 1 %
Eosinophils Absolute: 0.1 10*3/uL (ref 0.0–0.5)
Eosinophils Relative: 4 %
HCT: 44.1 % (ref 39.0–52.0)
Hemoglobin: 14.6 g/dL (ref 13.0–17.0)
Immature Granulocytes: 1 %
Lymphocytes Relative: 37 %
Lymphs Abs: 0.8 10*3/uL (ref 0.7–4.0)
MCH: 29.5 pg (ref 26.0–34.0)
MCHC: 33.1 g/dL (ref 30.0–36.0)
MCV: 89.1 fL (ref 80.0–100.0)
Monocytes Absolute: 0.3 10*3/uL (ref 0.1–1.0)
Monocytes Relative: 13 %
Neutro Abs: 1 10*3/uL — ABNORMAL LOW (ref 1.7–7.7)
Neutrophils Relative %: 44 %
Platelets: 93 10*3/uL — ABNORMAL LOW (ref 150–400)
RBC: 4.95 MIL/uL (ref 4.22–5.81)
RDW: 14.2 % (ref 11.5–15.5)
WBC: 2.2 10*3/uL — ABNORMAL LOW (ref 4.0–10.5)
nRBC: 0 % (ref 0.0–0.2)

## 2018-08-30 LAB — LACTATE DEHYDROGENASE: LDH: 120 U/L (ref 98–192)

## 2018-08-30 NOTE — Patient Instructions (Addendum)
[place] at [facility] Discharge Instructions  You were seen today by Dr. Delton Coombes. He went over your recent lab and biopsy results. He will have blood work drawn today and will give you follow up after those are back.   Thank you for choosing [place] at [facility] to provide your oncology and hematology care.  To afford each patient quality time with our provider, please arrive at least 15 minutes before your scheduled appointment time.   If you have a lab appointment with the [place] please come in thru the  Main Entrance and check in at the main information desk  You need to re-schedule your appointment should you arrive 10 or more minutes late.  We strive to give you quality time with our providers, and arriving late affects you and other patients whose appointments are after yours.  Also, if you no show three or more times for appointments you may be dismissed from the clinic at the providers discretion.     Again, thank you for choosing [facility].  Our hope is that these requests will decrease the amount of time that you wait before being seen by our physicians.       _____________________________________________________________  Should you have questions after your visit to [facility], please contact our office at [phone] between the hours of 8:00 a.m. and 4:30 p.m.  Voicemails left after 4:00 p.m. will not be returned until the following business day.  For prescription refill requests, have your pharmacy contact our office and allow 72 hours.    Cancer Center Support Programs:   > Cancer Support Group  2nd Tuesday of the month 1pm-2pm, Journey Room

## 2018-08-30 NOTE — Progress Notes (Signed)
[place], [place] [address]   CLINIC:  Medical Oncology/Hematology  PCP:  [facility], [place] [address] [phone]   REASON FOR VISIT:  Follow-up for pancytopenia.   INTERVAL HISTORY:  Mr. Faniel 33 y.o. male seen for follow-up of neutropenia and thrombocytopenia.  Denies any fevers, night sweats or weight loss in the last 6 months.  Low energy levels have improved.  Denies any new medications.  Appetite is 75% and energy levels are 75%.  Shoulder arthritis is stable.  Denies any recurrent infections.  No ER visits or hospitalizations.  He underwent a bone marrow biopsy on 08/15/2018.  Mild numbness in the hands has been stable.    REVIEW OF SYSTEMS:  Review of Systems  Neurological: Positive for numbness.  All other systems reviewed and are negative.    PAST MEDICAL/SURGICAL HISTORY:  Past Medical History:  Diagnosis Date   Medical history non-contributory    Past Surgical History:  Procedure Laterality Date   Arm Manipulation Right 1995?   INCISION AND DRAINAGE ABSCESS N/A 01/05/2013   Procedure: INCISION AND DRAINAGE PERIANAL  ABSCESS;  Surgeon: Jamesetta So, MD;  Location: [facility];  Service: General;  Laterality: N/A;   ROTATOR CUFF REPAIR     SHOULDER ARTHROSCOPY WITH OPEN ROTATOR CUFF REPAIR AND DISTAL CLAVICLE ACROMINECTOMY Left 11/02/2016   Procedure: LEFT SHOULDER ARTHROSCOPY WITH MINI OPEN ROTATOR CUFF REPAIR, OPEN BICEPS TENODESIS, AND DEBRIDEMENT;  Surgeon: Meredith Pel, MD;  Location: [place];  Service: Orthopedics;  Laterality: Left;   SHOULDER ARTHROSCOPY WITH SUBACROMIAL DECOMPRESSION, ROTATOR CUFF REPAIR AND BICEP TENDON REPAIR Right 12/09/2015   Procedure: SHOULDER DIAGNOSTIC OPERATIVE ARTHROSCOPY, SUBACROMIAL DECOMPRESSION, DISTAL CLAVICLE EXCISION, MINI-OPEN ROTATOR CUFF REPAIR AND BICEP TENODESIS.;  Surgeon: Meredith Pel, MD;  Location: [place];  Service: Orthopedics;   Laterality: Right;   widsom teeth  2017     SOCIAL HISTORY:  Social History   Socioeconomic History   Marital status: Married    Spouse name: Not on file   Number of children: Not on file   Years of education: Not on file   Highest education level: Not on file  Occupational History   Not on file  Social Needs   Financial resource strain: Not on file   Food insecurity    Worry: Not on file    Inability: Not on file   Transportation needs    Medical: Not on file    Non-medical: Not on file  Tobacco Use   Smoking status: Never Smoker   Smokeless tobacco: Never Used  Substance and Sexual Activity   Alcohol use: No   Drug use: No   Sexual activity: Not on file  Lifestyle   Physical activity    Days per week: Not on file    Minutes per session: Not on file   Stress: Not on file  Relationships   Social connections    Talks on phone: Not on file    Gets together: Not on file    Attends religious service: Not on file    Active member of club or organization: Not on file    Attends meetings of clubs or organizations: Not on file    Relationship status: Not on file   Intimate partner violence    Fear of current or ex partner: Not on file    Emotionally abused: Not on file    Physically abused: Not on file  Forced sexual activity: Not on file  °Other Topics Concern  °• Not on file  °Social History Narrative  °• Not on file  ° ° °FAMILY HISTORY:  °Family History  °Problem Relation Age of Onset  °• Prostate cancer Father   ° ° °CURRENT MEDICATIONS:  °Outpatient Encounter Medications as of 08/30/2018  °Medication Sig  °• zolpidem (AMBIEN) 10 MG tablet Take 10 mg by mouth as needed.   °• diazepam (VALIUM) 10 MG tablet Take 10 mg by mouth as needed.   ° °No facility-administered encounter medications on file as of 08/30/2018.   ° ° °ALLERGIES:  °Allergies  °Allergen Reactions  °• Hydrocodone Swelling  °  Tongue swelling  ° ° ° °PHYSICAL EXAM:  °ECOG Performance  status: 1 ° °Vitals:  ° 08/30/18 1153  °BP: 122/78  °Pulse: 89  °Resp: 16  °Temp: 98.2 °F (36.8 °C)  °SpO2: 98%  ° °Filed Weights  ° 08/30/18 1153  °Weight: 264 lb 11.2 oz (120.1 kg)  ° ° °Physical Exam °Vitals signs reviewed.  °Constitutional:   °   Appearance: Normal appearance.  °Cardiovascular:  °   Rate and Rhythm: Normal rate and regular rhythm.  °   Heart sounds: Normal heart sounds.  °Pulmonary:  °   Effort: Pulmonary effort is normal.  °   Breath sounds: Normal breath sounds.  °Abdominal:  °   General: There is no distension.  °   Palpations: Abdomen is soft. There is no mass.  °Musculoskeletal:     °   General: No swelling.  °Lymphadenopathy:  °   Cervical: No cervical adenopathy.  °Skin: °   General: Skin is warm.  °Neurological:  °   General: No focal deficit present.  °   Mental Status: He is alert and oriented to person, place, and time.  °Psychiatric:     °   Mood and Affect: Mood normal.     °   Behavior: Behavior normal.  ° ° ° ° °LABORATORY DATA:  °I have reviewed the labs as listed.  °CBC °   °Component Value Date/Time  ° WBC 1.6 (L) 08/15/2018 0741  ° RBC 4.86 08/15/2018 0741  ° HGB 14.4 08/15/2018 0741  ° HCT 43.6 08/15/2018 0741  ° PLT 75 (L) 08/15/2018 0741  ° MCV 89.7 08/15/2018 0741  ° MCH 29.6 08/15/2018 0741  ° MCHC 33.0 08/15/2018 0741  ° RDW 15.0 08/15/2018 0741  ° LYMPHSABS 0.6 (L) 08/15/2018 0741  ° MONOABS 0.3 08/15/2018 0741  ° EOSABS 0.1 08/15/2018 0741  ° BASOSABS 0.0 08/15/2018 0741  ° °CMP Latest Ref Rng & Units 08/02/2018 12/03/2015 01/04/2013  °Glucose 70 - 99 mg/dL 118(H) 92 88  °BUN 6 - 20 mg/dL 18 12 11  °Creatinine 0.61 - 1.24 mg/dL 1.05 1.15 1.08  °Sodium 135 - 145 mmol/L 140 138 139  °Potassium 3.5 - 5.1 mmol/L 3.9 4.4 3.8  °Chloride 98 - 111 mmol/L 109 106 104  °CO2 22 - 32 mmol/L 25 27 25  °Calcium 8.9 - 10.3 mg/dL 8.8(L) 9.0 9.3  °Total Protein 6.5 - 8.1 g/dL 6.8 - -  °Total Bilirubin 0.3 - 1.2 mg/dL 1.0 - -  °Alkaline Phos 38 - 126 U/L 68 - -  °AST 15 - 41 U/L 35 - -    °ALT 0 - 44 U/L 43 - -  ° ° ° ° ° °DIAGNOSTIC IMAGING:  °I have independently reviewed the scans and discussed with the patient. ° ° °I have reviewed    Darnelle Maffucci LPN's note and agree with the documentation.  I personally performed a face-to-face visit, made revisions and my assessment and plan is as follows.    ASSESSMENT & PLAN:   Other pancytopenia ([place]) 1.  Severe neutropenia and moderate thrombocytopenia: - Labs done at Dr. Juel Burrow office on 07/12/2018 showed white count of 2.8, platelet count of 72 and ANC of 700.  Follow-up labs on 07/24/2018 also showed worsening leukopenia and stable thrombocytopenia. - Denies any B symptoms.  Denies any tick bites.  No travel outside the country. - Nutrition and CT disorder work-up was negative.  Because of his persistent leukopenia and thrombocytopenia, bone marrow biopsy was ordered. - Bone marrow biopsy on 07/19/2018 shows normocellular marrow for age with trilineage hematopoiesis.  Progressive maturation.  Chromosome analysis was normal. - Peripheral blood flow cytometry was normal. - Tick disease panel was normal.  EBV panel showed very increased VCA IgM and VCA IgG and EBNA IgG.  It is possible that he had a recent infection with EBV which might have contributed to his cytopenias. -We will repeat another CBC with differential today.  If there is no improvement, will consider scanning his abdomen with a CT for splenomegaly.  He reports that he was told to have bigger spleen when he was 33 years old.   Total time spent is 25 minutes with more than 50% of the time spent face-to-face discussing lab results, bone marrow biopsy results, further follow-up, counseling and coordination of care.  Orders placed this encounter:  Orders Placed This Encounter  Procedures   CBC with Differential   Lactate dehydrogenase      Derek Jack, MD [place] [phone]

## 2018-08-30 NOTE — Assessment & Plan Note (Signed)
1.  Severe neutropenia and moderate thrombocytopenia: - Labs done at Dr. Juel Burrow office on 07/12/2018 showed white count of 2.8, platelet count of 72 and ANC of 700.  Follow-up labs on 07/24/2018 also showed worsening leukopenia and stable thrombocytopenia. - Denies any B symptoms.  Denies any tick bites.  No travel outside the country. - Nutrition and CT disorder work-up was negative.  Because of his persistent leukopenia and thrombocytopenia, bone marrow biopsy was ordered. - Bone marrow biopsy on 07/19/2018 shows normocellular marrow for age with trilineage hematopoiesis.  Progressive maturation.  Chromosome analysis was normal. - Peripheral blood flow cytometry was normal. - Tick disease panel was normal.  EBV panel showed very increased VCA IgM and VCA IgG and EBNA IgG.  It is possible that he had a recent infection with EBV which might have contributed to his cytopenias. -We will repeat another CBC with differential today.  If there is no improvement, will consider scanning his abdomen with a CT for splenomegaly.  He reports that he was told to have bigger spleen when he was 33 years old.

## 2018-09-28 ENCOUNTER — Other Ambulatory Visit (HOSPITAL_COMMUNITY): Payer: 59

## 2018-09-28 ENCOUNTER — Ambulatory Visit (HOSPITAL_COMMUNITY): Payer: 59 | Admitting: Hematology

## 2018-10-02 ENCOUNTER — Ambulatory Visit (INDEPENDENT_AMBULATORY_CARE_PROVIDER_SITE_OTHER): Payer: 59

## 2018-10-02 ENCOUNTER — Ambulatory Visit (INDEPENDENT_AMBULATORY_CARE_PROVIDER_SITE_OTHER): Payer: 59 | Admitting: Orthopedic Surgery

## 2018-10-02 ENCOUNTER — Encounter: Payer: Self-pay | Admitting: Orthopedic Surgery

## 2018-10-02 VITALS — Ht 73.0 in | Wt 264.0 lb

## 2018-10-02 DIAGNOSIS — M25572 Pain in left ankle and joints of left foot: Secondary | ICD-10-CM

## 2018-10-02 DIAGNOSIS — G8929 Other chronic pain: Secondary | ICD-10-CM

## 2018-10-02 DIAGNOSIS — M25562 Pain in left knee: Secondary | ICD-10-CM

## 2018-10-04 ENCOUNTER — Encounter: Payer: Self-pay | Admitting: Orthopedic Surgery

## 2018-10-04 NOTE — Progress Notes (Signed)
Office Visit Note   Patient: [name] Kelley           Date of Birth: 1985/10/16           MRN: [id number] Visit Date: 10/02/2018              Requested by: Elfredia NevinsFusco, [place], MD [address] [place],  [place] [id number] PCP: Elfredia NevinsFusco, [place], MD  Chief Complaint  Patient presents with   Left Ankle - Pain   Left Knee - Pain      HPI: Patient is a 33 year old gentleman who presents with chronic left ankle and left knee pain.  Patient states that he previously has had x-rays with Dr. August Saucerean which showed arthritis and bone spurs.  Patient complains of pain with ambulation limping and decreased activities due to pain.  Patient also complains of left knee stiffness after prolonged rest with start up stiffness pain with squatting or stepping up into a truck pain anteriorly over the tibial tubercle.  Patient states that he originally injured his ankle while playing basketball and had extensive ecchymosis.  Assessment & Plan: Visit Diagnoses:  1. Pain in left ankle and joints of left foot   2. Chronic pain of left knee     Plan: The left ankle was injected patient was given instructions for Achilles stretching and this was demonstrated.  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  Follow-Up Instructions: Return in about 4 weeks (around 10/30/2018).   Ortho Exam  Patient is alert, oriented, no adenopathy, well-dressed, normal affect, normal respiratory effort. Examination of the left ankle patient has dorsiflexion only to neutral he is good pulses he is tender anteriorly over the joint line.  Anterior drawer is stable with no ligamentous laxity.  The posterior tibial tendon peroneal tendons and Achilles tendon are nontender to palpation.  Examination the left knee patient is point tender to palpation over  the insertion of the patella tendon at the tibial tubercle.  Collaterals and cruciates are stable there is no effusion.  Patient does have a palpable clunk with range of motion of the patella within the groove.  There is crepitation in the patellofemoral joint with range of motion.  Medial lateral joint lines are nontender.  Imaging: No results found. No images are attached to the encounter.  Labs: Lab Results  Component Value Date   ESRSEDRATE 1 08/02/2018   CRP 1.0 (H) 08/02/2018   REPTSTATUS 01/08/2013 FINAL 01/05/2013   REPTSTATUS 01/12/2013 FINAL 01/05/2013   GRAMSTAIN  01/05/2013    ABUNDANT WBC PRESENT,BOTH PMN AND MONONUCLEAR NO SQUAMOUS EPITHELIAL CELLS SEEN NO ORGANISMS SEEN Performed at [facility]   GRAMSTAIN  01/05/2013    ABUNDANT WBC PRESENT,BOTH PMN AND MONONUCLEAR NO SQUAMOUS EPITHELIAL CELLS SEEN NO ORGANISMS SEEN Performed at [facility]   CULT  01/05/2013    FEW ESCHERICHIA COLI Performed at [facility]   CULT  01/05/2013    MODERATE BACTEROIDES VULGATUS Note: BETA LACTAMASE POSITIVE Performed at [facility]   LABORGA ESCHERICHIA COLI 01/05/2013     Lab Results  Component Value Date   ALBUMIN 3.9 08/02/2018    No results found for: MG No results found for: VD25OH  No results found for: PREALBUMIN CBC EXTENDED Latest Ref Rng & Units 08/30/2018 08/15/2018 08/09/2018  WBC 4.0 - 10.5 K/uL 2.2(L) 1.6(L) -  RBC 4.22 -  5.81 MIL/uL 4.95 4.86 -  HGB 13.0 - 17.0 g/dL 14.6 14.4 -  HCT 39.0 - 52.0 % 44.1 43.6 -  PLT 150 - 400 K/uL 93(L) 75(L) 66(L)  NEUTROABS 1.7 - 7.7 K/uL 1.0(L) 0.6(L) -  LYMPHSABS 0.7 - 4.0 K/uL 0.8 0.6(L) -     Body mass index is 34.83 kg/m.  Orders:  Orders Placed This Encounter  Procedures   XR Ankle 2 Views Left   XR Knee 1-2 Views Left   No orders of the defined types were placed in this encounter.    Procedures: No procedures performed  Clinical Data: No additional  findings.  ROS:  All other systems negative, except as noted in the HPI. Review of Systems  Objective: Vital Signs: Ht 6\' 1"  (1.854 m)    Wt 264 lb (119.7 kg)    BMI 34.83 kg/m   Specialty Comments:  No specialty comments available.  PMFS History: Patient Active Problem List   Diagnosis Date Noted   Other pancytopenia ([place]) 08/02/2018   Complete tear of right rotator cuff 02/05/2016   Complete rotator cuff tear of left shoulder 02/05/2016   Past Medical History:  Diagnosis Date   Medical history non-contributory     Family History  Problem Relation Age of Onset   Prostate cancer Father     Past Surgical History:  Procedure Laterality Date   Arm Manipulation Right 1995?   INCISION AND DRAINAGE ABSCESS N/A 01/05/2013   Procedure: INCISION AND DRAINAGE PERIANAL  ABSCESS;  Surgeon: Jamesetta So, MD;  Location: [facility];  Service: General;  Laterality: N/A;   ROTATOR CUFF REPAIR     SHOULDER ARTHROSCOPY WITH OPEN ROTATOR CUFF REPAIR AND DISTAL CLAVICLE ACROMINECTOMY Left 11/02/2016   Procedure: LEFT SHOULDER ARTHROSCOPY WITH MINI OPEN ROTATOR CUFF REPAIR, OPEN BICEPS TENODESIS, AND DEBRIDEMENT;  Surgeon: Meredith Pel, MD;  Location: [facility];  Service: Orthopedics;  Laterality: Left;   SHOULDER ARTHROSCOPY WITH SUBACROMIAL DECOMPRESSION, ROTATOR CUFF REPAIR AND BICEP TENDON REPAIR Right 12/09/2015   Procedure: SHOULDER DIAGNOSTIC OPERATIVE ARTHROSCOPY, SUBACROMIAL DECOMPRESSION, DISTAL CLAVICLE EXCISION, MINI-OPEN ROTATOR CUFF REPAIR AND BICEP TENODESIS.;  Surgeon: Meredith Pel, MD;  Location: [facility];  Service: Orthopedics;  Laterality: Right;   widsom teeth  2017   Social History   Occupational History   Not on file  Tobacco Use   Smoking status: Never Smoker   Smokeless tobacco: Never Used  Substance and Sexual Activity   Alcohol use: No   Drug use: No   Sexual activity: Not on file

## 2018-10-30 ENCOUNTER — Ambulatory Visit: Payer: 59 | Admitting: Orthopedic Surgery

## 2018-11-09 ENCOUNTER — Encounter: Payer: Self-pay | Admitting: Orthopedic Surgery

## 2018-11-09 ENCOUNTER — Ambulatory Visit (INDEPENDENT_AMBULATORY_CARE_PROVIDER_SITE_OTHER): Payer: 59 | Admitting: Orthopedic Surgery

## 2018-11-09 VITALS — Ht 73.0 in | Wt 264.0 lb

## 2018-11-09 DIAGNOSIS — M25872 Other specified joint disorders, left ankle and foot: Secondary | ICD-10-CM

## 2018-11-09 NOTE — Progress Notes (Signed)
Office Visit Note   Patient: [name] Kelley           Date of Birth: 12-03-1985           MRN: [id number] Visit Date: 11/09/2018              Requested by: Elfredia Nevins, MD [address] [place],  [place] [address] PCP: Elfredia Nevins, MD  Chief Complaint  Patient presents with  . Left Knee - Follow-up  . Left Ankle - Follow-up      HPI: Patient is a 33 year old gentleman who presents in follow-up for impingement of his left ankle.  Patient states that he has had some temporary relief of pain however he still has sharp stabbing pain that comes and goes.  He states his pain is occasionally a 10/10 anteriorly over the ankle which he describes as a sharp knife pain.  Assessment & Plan: Visit Diagnoses:  1. Impingement syndrome of left ankle     Plan: Discussed with the patient his symptoms are consistent with impingement.  Discussed that we could proceed with arthroscopic debridement of the impingement.  Discussed that this generally provides good relief and 75% of the patients.  Risks and benefits were discussed including infection neurovascular injury persistent pain need for additional surgery.  Patient states he understands wished to proceed with left ankle arthroscopy and debridement.  Follow-Up Instructions: Return if symptoms worsen or fail to improve, for Follow-up postoperatively.   Ortho Exam  Patient is alert, oriented, no adenopathy, well-dressed, normal affect, normal respiratory effort. Examination patient has good pulses in the left foot he has no dystrophic symptoms anterior drawer is stable no tenderness to palpation over the ligaments tendons are nontender to palpation.  He is point tender to palpation anteriorly over the ankle.  Pain reproduced with maximal plantarflexion and dorsiflexion of the ankle.  Imaging: No results found. No images are attached to the encounter.  Labs: Lab Results  Component Value Date   ESRSEDRATE 1 08/02/2018   CRP 1.0 (H)  08/02/2018   REPTSTATUS 01/08/2013 FINAL 01/05/2013   REPTSTATUS 01/12/2013 FINAL 01/05/2013   GRAMSTAIN  01/05/2013    ABUNDANT WBC PRESENT,BOTH PMN AND MONONUCLEAR NO SQUAMOUS EPITHELIAL CELLS SEEN NO ORGANISMS SEEN Performed at [facility]   GRAMSTAIN  01/05/2013    ABUNDANT WBC PRESENT,BOTH PMN AND MONONUCLEAR NO SQUAMOUS EPITHELIAL CELLS SEEN NO ORGANISMS SEEN Performed at [facility]   CULT  01/05/2013    FEW ESCHERICHIA COLI Performed at [facility]   CULT  01/05/2013    MODERATE BACTEROIDES VULGATUS Note: BETA LACTAMASE POSITIVE Performed at [facility]   LABORGA ESCHERICHIA COLI 01/05/2013     Lab Results  Component Value Date   ALBUMIN 3.9 08/02/2018    No results found for: MG No results found for: VD25OH  No results found for: PREALBUMIN CBC EXTENDED Latest Ref Rng & Units 08/30/2018 08/15/2018 08/09/2018  WBC 4.0 - 10.5 K/uL 2.2(L) 1.6(L) -  RBC 4.22 - 5.81 MIL/uL 4.95 4.86 -  HGB 13.0 - 17.0 g/dL 11.9 41.7 -  HCT 40.8 - 52.0 % 44.1 43.6 -  PLT 150 - 400 K/uL 93(L) 75(L) 66(L)  NEUTROABS 1.7 - 7.7 K/uL 1.0(L) 0.6(L) -  LYMPHSABS 0.7 - 4.0 K/uL 0.8 0.6(L) -     Body mass index is 34.83 kg/m.  Orders:  No orders of the defined types were placed in this encounter.  No orders of the defined types were placed in this  encounter.    Procedures: No procedures performed  Clinical Data: No additional findings.  ROS:  All other systems negative, except as noted in the HPI. Review of Systems  Objective: Vital Signs: Ht 6\' 1"  (1.854 m)   Wt 264 lb (119.7 kg)   BMI 34.83 kg/m   Specialty Comments:  No specialty comments available.  PMFS History: Patient Active Problem List   Diagnosis Date Noted  . Other pancytopenia ([place]) 08/02/2018  . Complete tear of right rotator cuff 02/05/2016  . Complete rotator cuff tear of left shoulder 02/05/2016   Past Medical History:  Diagnosis Date  . Medical history  non-contributory     Family History  Problem Relation Age of Onset  . Prostate cancer Father     Past Surgical History:  Procedure Laterality Date  . Arm Manipulation Right 1995?  . INCISION AND DRAINAGE ABSCESS N/A 01/05/2013   Procedure: INCISION AND DRAINAGE PERIANAL  ABSCESS;  Surgeon: Jamesetta So, MD;  Location: [facility];  Service: General;  Laterality: N/A;  . ROTATOR CUFF REPAIR    . SHOULDER ARTHROSCOPY WITH OPEN ROTATOR CUFF REPAIR AND DISTAL CLAVICLE ACROMINECTOMY Left 11/02/2016   Procedure: LEFT SHOULDER ARTHROSCOPY WITH MINI OPEN ROTATOR CUFF REPAIR, OPEN BICEPS TENODESIS, AND DEBRIDEMENT;  Surgeon: Meredith Pel, MD;  Location: [facility];  Service: Orthopedics;  Laterality: Left;  . SHOULDER ARTHROSCOPY WITH SUBACROMIAL DECOMPRESSION, ROTATOR CUFF REPAIR AND BICEP TENDON REPAIR Right 12/09/2015   Procedure: SHOULDER DIAGNOSTIC OPERATIVE ARTHROSCOPY, SUBACROMIAL DECOMPRESSION, DISTAL CLAVICLE EXCISION, MINI-OPEN ROTATOR CUFF REPAIR AND BICEP TENODESIS.;  Surgeon: Meredith Pel, MD;  Location: [facility];  Service: Orthopedics;  Laterality: Right;  . widsom teeth  2017   Social History   Occupational History  . Not on file  Tobacco Use  . Smoking status: Never Smoker  . Smokeless tobacco: Never Used  Substance and Sexual Activity  . Alcohol use: No  . Drug use: No  . Sexual activity: Not on file

## 2018-11-27 DIAGNOSIS — M542 Cervicalgia: Secondary | ICD-10-CM | POA: Diagnosis not present

## 2018-11-27 DIAGNOSIS — R0981 Nasal congestion: Secondary | ICD-10-CM | POA: Diagnosis not present

## 2018-11-27 DIAGNOSIS — E876 Hypokalemia: Secondary | ICD-10-CM | POA: Diagnosis not present

## 2018-11-27 DIAGNOSIS — D696 Thrombocytopenia, unspecified: Secondary | ICD-10-CM | POA: Diagnosis not present

## 2018-11-27 DIAGNOSIS — Z0000 Encounter for general adult medical examination without abnormal findings: Secondary | ICD-10-CM | POA: Diagnosis not present

## 2018-11-27 DIAGNOSIS — R05 Cough: Secondary | ICD-10-CM | POA: Diagnosis not present

## 2018-11-27 DIAGNOSIS — E782 Mixed hyperlipidemia: Secondary | ICD-10-CM | POA: Diagnosis not present

## 2018-11-27 DIAGNOSIS — M545 Low back pain: Secondary | ICD-10-CM | POA: Diagnosis not present

## 2018-11-27 DIAGNOSIS — G4700 Insomnia, unspecified: Secondary | ICD-10-CM | POA: Diagnosis not present

## 2019-01-24 ENCOUNTER — Telehealth: Payer: Self-pay

## 2019-01-24 NOTE — Telephone Encounter (Signed)
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 Marlou Sa concerning disability peer to peer.  Asked her if she needed to speak him and she stated for him to give her a call back.  CB# [phone].  Please advise.  Thank you.

## 2019-01-24 NOTE — Telephone Encounter (Signed)
Please advise. Thanks.  

## 2019-02-01 DIAGNOSIS — D696 Thrombocytopenia, unspecified: Secondary | ICD-10-CM | POA: Diagnosis not present

## 2019-02-01 DIAGNOSIS — E876 Hypokalemia: Secondary | ICD-10-CM | POA: Diagnosis not present

## 2019-02-01 DIAGNOSIS — E782 Mixed hyperlipidemia: Secondary | ICD-10-CM | POA: Diagnosis not present

## 2019-02-01 DIAGNOSIS — R6882 Decreased libido: Secondary | ICD-10-CM | POA: Diagnosis not present

## 2019-02-01 DIAGNOSIS — R5383 Other fatigue: Secondary | ICD-10-CM | POA: Diagnosis not present

## 2019-02-13 DIAGNOSIS — M25572 Pain in left ankle and joints of left foot: Secondary | ICD-10-CM | POA: Diagnosis not present

## 2019-02-13 DIAGNOSIS — E875 Hyperkalemia: Secondary | ICD-10-CM | POA: Diagnosis not present

## 2019-02-13 DIAGNOSIS — M542 Cervicalgia: Secondary | ICD-10-CM | POA: Diagnosis not present

## 2019-02-13 DIAGNOSIS — D696 Thrombocytopenia, unspecified: Secondary | ICD-10-CM | POA: Diagnosis not present

## 2019-02-13 DIAGNOSIS — D709 Neutropenia, unspecified: Secondary | ICD-10-CM | POA: Diagnosis not present

## 2019-02-13 DIAGNOSIS — M545 Low back pain: Secondary | ICD-10-CM | POA: Diagnosis not present

## 2019-02-13 DIAGNOSIS — G4700 Insomnia, unspecified: Secondary | ICD-10-CM | POA: Diagnosis not present

## 2019-02-13 DIAGNOSIS — F338 Other recurrent depressive disorders: Secondary | ICD-10-CM | POA: Diagnosis not present

## 2019-02-13 DIAGNOSIS — E785 Hyperlipidemia, unspecified: Secondary | ICD-10-CM | POA: Diagnosis not present

## 2019-02-20 ENCOUNTER — Other Ambulatory Visit: Payer: Self-pay

## 2019-02-21 ENCOUNTER — Inpatient Hospital Stay (HOSPITAL_COMMUNITY): Payer: 59 | Attending: Hematology | Admitting: Hematology

## 2019-02-21 VITALS — BP 119/69 | HR 82 | Temp 97.1°F | Resp 18 | Wt 276.2 lb

## 2019-02-21 DIAGNOSIS — Z8619 Personal history of other infectious and parasitic diseases: Secondary | ICD-10-CM | POA: Diagnosis not present

## 2019-02-21 DIAGNOSIS — R161 Splenomegaly, not elsewhere classified: Secondary | ICD-10-CM | POA: Diagnosis not present

## 2019-02-21 DIAGNOSIS — R590 Localized enlarged lymph nodes: Secondary | ICD-10-CM | POA: Insufficient documentation

## 2019-02-21 DIAGNOSIS — R140 Abdominal distension (gaseous): Secondary | ICD-10-CM | POA: Insufficient documentation

## 2019-02-21 DIAGNOSIS — D696 Thrombocytopenia, unspecified: Secondary | ICD-10-CM | POA: Diagnosis not present

## 2019-02-21 DIAGNOSIS — R6881 Early satiety: Secondary | ICD-10-CM | POA: Diagnosis not present

## 2019-02-21 DIAGNOSIS — D61818 Other pancytopenia: Secondary | ICD-10-CM | POA: Insufficient documentation

## 2019-02-21 DIAGNOSIS — Z791 Long term (current) use of non-steroidal anti-inflammatories (NSAID): Secondary | ICD-10-CM | POA: Insufficient documentation

## 2019-02-21 DIAGNOSIS — R5383 Other fatigue: Secondary | ICD-10-CM | POA: Insufficient documentation

## 2019-02-21 DIAGNOSIS — Z79899 Other long term (current) drug therapy: Secondary | ICD-10-CM | POA: Diagnosis not present

## 2019-02-21 NOTE — Telephone Encounter (Signed)
no

## 2019-02-21 NOTE — Telephone Encounter (Signed)
Per Dr August Saucer he does not wish to discuss this with her. Stated she could refer to office notes.

## 2019-02-22 NOTE — Assessment & Plan Note (Addendum)
1.  Severe neutropenia and moderate thrombocytopenia: - Labs done at Dr. Juel Burrow office on 07/12/2018 showed white count of 2.8, platelet count of 72 and ANC of 700.  Follow-up labs on 07/24/2018 also showed worsening leukopenia and stable thrombocytopenia. - Denies any B symptoms.  Denies any tick bites.  No travel outside the country. - Nutrition and CT disorder work-up was negative.  Because of his persistent leukopenia and thrombocytopenia, bone marrow biopsy was ordered. - Bone marrow biopsy on 07/19/2018 shows normocellular marrow for age with trilineage hematopoiesis.  Progressive maturation.  Chromosome analysis was normal. - Peripheral blood flow cytometry was normal. - Tick disease panel was normal.  EBV panel showed very increased VCA IgM and VCA IgG and EBNA IgG.  It is possible that he had a recent infection with EBV which might have contributed to his cytopenias. -Patient had labs performed with his primary care provider.  White blood cell count still remains low at 2.1, absolute neutrophil count 1.5, hemoglobin within normal and platelets 85,000.  No new symptoms to report.  Due to continued cytopenias, have recommended that patient proceed with CT of abdomen to evaluate for splenomegaly. -We will plan to see the patient back in 3 weeks.

## 2019-02-22 NOTE — Progress Notes (Signed)
[place], [place] [address]   CLINIC:  Medical Oncology/Hematology  PCP:  [facility], [place] [address] [phone]   REASON FOR VISIT:  Follow-up for neutropenia and thrombocytopenia   CURRENT THERAPY: Clinical surveillance     INTERVAL HISTORY:  [name] Kelley 34 y.o. male presents today for follow up. Reports overall doing well. Reports mild fatigue. Reports he was diagnosed with Covid-19 approximately 2 mths ago. He has since recovered. He denies any fevers, chills, night sweats. No weight loss. Reports decreased appetite.  No change in bowle habits. He is here for repeat labs and office visit.   REVIEW OF SYSTEMS:  Review of Systems  Constitutional: Positive for fatigue.  HENT:  Negative.   Eyes: Negative.   Respiratory: Negative.   Cardiovascular: Negative.   Gastrointestinal: Negative.  Negative for abdominal pain.  Endocrine: Negative.   Genitourinary: Negative.    Musculoskeletal: Negative.   Skin: Negative.   Neurological: Negative.   Hematological: Negative.   Psychiatric/Behavioral: Negative.      PAST MEDICAL/SURGICAL HISTORY:  Past Medical History:  Diagnosis Date  . Medical history non-contributory    Past Surgical History:  Procedure Laterality Date  . Arm Manipulation Right 1995?  . INCISION AND DRAINAGE ABSCESS N/A 01/05/2013   Procedure: INCISION AND DRAINAGE PERIANAL  ABSCESS;  Surgeon: Jamesetta So, MD;  Location: [facility];  Service: General;  Laterality: N/A;  . ROTATOR CUFF REPAIR    . SHOULDER ARTHROSCOPY WITH OPEN ROTATOR CUFF REPAIR AND DISTAL CLAVICLE ACROMINECTOMY Left 11/02/2016   Procedure: LEFT SHOULDER ARTHROSCOPY WITH MINI OPEN ROTATOR CUFF REPAIR, OPEN BICEPS TENODESIS, AND DEBRIDEMENT;  Surgeon: Meredith Pel, MD;  Location: [facility];  Service: Orthopedics;  Laterality: Left;  . SHOULDER ARTHROSCOPY WITH SUBACROMIAL DECOMPRESSION, ROTATOR CUFF REPAIR AND BICEP  TENDON REPAIR Right 12/09/2015   Procedure: SHOULDER DIAGNOSTIC OPERATIVE ARTHROSCOPY, SUBACROMIAL DECOMPRESSION, DISTAL CLAVICLE EXCISION, MINI-OPEN ROTATOR CUFF REPAIR AND BICEP TENODESIS.;  Surgeon: Meredith Pel, MD;  Location: [facility];  Service: Orthopedics;  Laterality: Right;  . widsom teeth  2017     SOCIAL HISTORY:  Social History   Socioeconomic History  . Marital status: Married    Spouse name: Not on file  . Number of children: Not on file  . Years of education: Not on file  . Highest education level: Not on file  Occupational History  . Not on file  Tobacco Use  . Smoking status: Never Smoker  . Smokeless tobacco: Never Used  Substance and Sexual Activity  . Alcohol use: No  . Drug use: No  . Sexual activity: Not on file  Other Topics Concern  . Not on file  Social History Narrative  . Not on file   Social Determinants of Health   Financial Resource Strain:   . Difficulty of Paying Living Expenses: Not on file  Food Insecurity:   . Worried About Charity fundraiser in the Last Year: Not on file  . Ran Out of Food in the Last Year: Not on file  Transportation Needs:   . Lack of Transportation (Medical): Not on file  . Lack of Transportation (Non-Medical): Not on file  Physical Activity:   . Days of Exercise per Week: Not on file  . Minutes of Exercise per Session: Not on file  Stress:   . Feeling of Stress : Not on file  Social Connections:   . Frequency of Communication with Friends and  Family: Not on file  . Frequency of Social Gatherings with Friends and Family: Not on file  . Attends Religious Services: Not on file  . Active Member of Clubs or Organizations: Not on file  . Attends Archivist Meetings: Not on file  . Marital Status: Not on file  Intimate Partner Violence:   . Fear of Current or Ex-Partner: Not on file  . Emotionally Abused: Not on file  . Physically Abused: Not on file  . Sexually Abused: Not on file    FAMILY  HISTORY:  Family History  Problem Relation Age of Onset  . Prostate cancer Father     CURRENT MEDICATIONS:  Outpatient Encounter Medications as of 02/21/2019  Medication Sig  . buPROPion (WELLBUTRIN XL) 150 MG 24 hr tablet Take 150 mg by mouth daily.  . celecoxib (CELEBREX) 200 MG capsule Take 200 mg by mouth daily.  . [DISCONTINUED] diazepam (VALIUM) 10 MG tablet Take 10 mg by mouth as needed.   . [DISCONTINUED] zolpidem (AMBIEN) 10 MG tablet Take 10 mg by mouth as needed.    No facility-administered encounter medications on file as of 02/21/2019.    ALLERGIES:  Allergies  Allergen Reactions  . Hydrocodone Swelling    Tongue swelling     PHYSICAL EXAM:  ECOG Performance status: 0  Vitals:   02/21/19 0915  BP: 119/69  Pulse: 82  Resp: 18  Temp: (!) 97.1 F (36.2 C)  SpO2: 98%   Filed Weights   02/21/19 0915  Weight: 276 lb 3.2 oz (125.3 kg)    Physical Exam Constitutional:      Appearance: Normal appearance.  HENT:     Head: Normocephalic.     Right Ear: External ear normal.     Left Ear: External ear normal.     Nose: Nose normal.     Mouth/Throat:     Pharynx: Oropharynx is clear.  Eyes:     Conjunctiva/sclera: Conjunctivae normal.  Cardiovascular:     Rate and Rhythm: Normal rate and regular rhythm.     Pulses: Normal pulses.     Heart sounds: Normal heart sounds.  Pulmonary:     Effort: Pulmonary effort is normal.     Breath sounds: Normal breath sounds.  Abdominal:     General: Bowel sounds are normal.  Musculoskeletal:        General: Normal range of motion.     Cervical back: Normal range of motion.  Skin:    General: Skin is warm.  Neurological:     General: No focal deficit present.     Mental Status: He is alert and oriented to person, place, and time.  Psychiatric:        Mood and Affect: Mood normal.        Behavior: Behavior normal.      LABORATORY DATA:  I have reviewed the labs as listed.  CBC    Component Value Date/Time    WBC 2.2 (L) 08/30/2018 1222   RBC 4.95 08/30/2018 1222   HGB 14.6 08/30/2018 1222   HCT 44.1 08/30/2018 1222   PLT 93 (L) 08/30/2018 1222   MCV 89.1 08/30/2018 1222   MCH 29.5 08/30/2018 1222   MCHC 33.1 08/30/2018 1222   RDW 14.2 08/30/2018 1222   LYMPHSABS 0.8 08/30/2018 1222   MONOABS 0.3 08/30/2018 1222   EOSABS 0.1 08/30/2018 1222   BASOSABS 0.0 08/30/2018 1222   CMP Latest Ref Rng & Units 08/02/2018 12/03/2015 01/04/2013  Glucose 70 -  99 mg/dL 118(H) 92 88  BUN 6 - 20 mg/dL '18 12 11  '$ Creatinine 0.61 - 1.24 mg/dL 1.05 1.15 1.08  Sodium 135 - 145 mmol/L 140 138 139  Potassium 3.5 - 5.1 mmol/L 3.9 4.4 3.8  Chloride 98 - 111 mmol/L 109 106 104  CO2 22 - 32 mmol/L '25 27 25  '$ Calcium 8.9 - 10.3 mg/dL 8.8(L) 9.0 9.3  Total Protein 6.5 - 8.1 g/dL 6.8 - -  Total Bilirubin 0.3 - 1.2 mg/dL 1.0 - -  Alkaline Phos 38 - 126 U/L 68 - -  AST 15 - 41 U/L 35 - -  ALT 0 - 44 U/L 43 - -          ASSESSMENT & PLAN:   Other pancytopenia ([place]) 1.  Severe neutropenia and moderate thrombocytopenia: - Labs done at Dr. Juel Burrow office on 07/12/2018 showed white count of 2.8, platelet count of 72 and ANC of 700.  Follow-up labs on 07/24/2018 also showed worsening leukopenia and stable thrombocytopenia. - Denies any B symptoms.  Denies any tick bites.  No travel outside the country. - Nutrition and CT disorder work-up was negative.  Because of his persistent leukopenia and thrombocytopenia, bone marrow biopsy was ordered. - Bone marrow biopsy on 07/19/2018 shows normocellular marrow for age with trilineage hematopoiesis.  Progressive maturation.  Chromosome analysis was normal. - Peripheral blood flow cytometry was normal. - Tick disease panel was normal.  EBV panel showed very increased VCA IgM and VCA IgG and EBNA IgG.  It is possible that he had a recent infection with EBV which might have contributed to his cytopenias. -Patient had labs performed with his primary care provider.  White blood cell  count still remains low at 2.1, absolute neutrophil count 1.5, hemoglobin within normal and platelets 85,000.  No new symptoms to report.  Due to continued cytopenias, have recommended that patient proceed with CT of abdomen to evaluate for splenomegaly. -We will plan to see the patient back in 3 weeks.        Orders placed this encounter:  Orders Placed This Encounter  Procedures  . CT Abdomen Pelvis [place] [phone]

## 2019-03-02 ENCOUNTER — Other Ambulatory Visit (HOSPITAL_COMMUNITY): Payer: Self-pay

## 2019-03-02 DIAGNOSIS — D61818 Other pancytopenia: Secondary | ICD-10-CM

## 2019-03-02 DIAGNOSIS — D696 Thrombocytopenia, unspecified: Secondary | ICD-10-CM

## 2019-03-05 ENCOUNTER — Other Ambulatory Visit: Payer: Self-pay

## 2019-03-05 ENCOUNTER — Inpatient Hospital Stay (HOSPITAL_COMMUNITY): Payer: 59

## 2019-03-05 DIAGNOSIS — Z8619 Personal history of other infectious and parasitic diseases: Secondary | ICD-10-CM | POA: Diagnosis not present

## 2019-03-05 DIAGNOSIS — R5383 Other fatigue: Secondary | ICD-10-CM | POA: Diagnosis not present

## 2019-03-05 DIAGNOSIS — D61818 Other pancytopenia: Secondary | ICD-10-CM | POA: Diagnosis not present

## 2019-03-05 DIAGNOSIS — D696 Thrombocytopenia, unspecified: Secondary | ICD-10-CM

## 2019-03-05 DIAGNOSIS — R161 Splenomegaly, not elsewhere classified: Secondary | ICD-10-CM | POA: Diagnosis not present

## 2019-03-05 DIAGNOSIS — Z79899 Other long term (current) drug therapy: Secondary | ICD-10-CM | POA: Diagnosis not present

## 2019-03-05 DIAGNOSIS — R140 Abdominal distension (gaseous): Secondary | ICD-10-CM | POA: Diagnosis not present

## 2019-03-05 DIAGNOSIS — Z791 Long term (current) use of non-steroidal anti-inflammatories (NSAID): Secondary | ICD-10-CM | POA: Diagnosis not present

## 2019-03-05 DIAGNOSIS — R6881 Early satiety: Secondary | ICD-10-CM | POA: Diagnosis not present

## 2019-03-05 DIAGNOSIS — R590 Localized enlarged lymph nodes: Secondary | ICD-10-CM | POA: Diagnosis not present

## 2019-03-05 LAB — COMPREHENSIVE METABOLIC PANEL
ALT: 30 U/L (ref 0–44)
AST: 23 U/L (ref 15–41)
Albumin: 4.2 g/dL (ref 3.5–5.0)
Alkaline Phosphatase: 65 U/L (ref 38–126)
Anion gap: 6 (ref 5–15)
BUN: 18 mg/dL (ref 6–20)
CO2: 24 mmol/L (ref 22–32)
Calcium: 9.1 mg/dL (ref 8.9–10.3)
Chloride: 107 mmol/L (ref 98–111)
Creatinine, Ser: 1.09 mg/dL (ref 0.61–1.24)
GFR calc Af Amer: >60 mL/min (ref >60)
GFR calc non Af Amer: >60 mL/min (ref >60)
Glucose, Bld: 100 mg/dL — ABNORMAL HIGH (ref 70–99)
Potassium: 4.6 mmol/L (ref 3.5–5.1)
Sodium: 137 mmol/L (ref 135–145)
Total Bilirubin: 1.1 mg/dL (ref 0.3–1.2)
Total Protein: 7.3 g/dL (ref 6.5–8.1)

## 2019-03-05 LAB — CBC WITH DIFFERENTIAL/PLATELET
Abs Immature Granulocytes: 0.04 10*3/uL (ref 0.00–0.07)
Basophils Absolute: 0 10*3/uL (ref 0.0–0.1)
Basophils Relative: 1 %
Eosinophils Absolute: 0.1 10*3/uL (ref 0.0–0.5)
Eosinophils Relative: 7 %
HCT: 44.5 % (ref 39.0–52.0)
Hemoglobin: 14.8 g/dL (ref 13.0–17.0)
Immature Granulocytes: 2 %
Lymphocytes Relative: 36 %
Lymphs Abs: 0.7 10*3/uL (ref 0.7–4.0)
MCH: 29.7 pg (ref 26.0–34.0)
MCHC: 33.3 g/dL (ref 30.0–36.0)
MCV: 89.4 fL (ref 80.0–100.0)
Monocytes Absolute: 0.2 10*3/uL (ref 0.1–1.0)
Monocytes Relative: 13 %
Neutro Abs: 0.8 10*3/uL — ABNORMAL LOW (ref 1.7–7.7)
Neutrophils Relative %: 41 %
Platelets: 82 10*3/uL — ABNORMAL LOW (ref 150–400)
RBC: 4.98 MIL/uL (ref 4.22–5.81)
RDW: 13.4 % (ref 11.5–15.5)
WBC: 1.9 10*3/uL — ABNORMAL LOW (ref 4.0–10.5)
nRBC: 0 % (ref 0.0–0.2)

## 2019-03-07 ENCOUNTER — Ambulatory Visit (HOSPITAL_COMMUNITY)
Admission: RE | Admit: 2019-03-07 | Discharge: 2019-03-07 | Disposition: A | Discharge status: Home / Self Care | Payer: 59 | Source: Ambulatory Visit | Attending: Hematology | Admitting: Hematology

## 2019-03-07 ENCOUNTER — Other Ambulatory Visit: Payer: Self-pay

## 2019-03-07 DIAGNOSIS — D61818 Other pancytopenia: Secondary | ICD-10-CM | POA: Insufficient documentation

## 2019-03-07 DIAGNOSIS — D696 Thrombocytopenia, unspecified: Secondary | ICD-10-CM | POA: Diagnosis not present

## 2019-03-07 DIAGNOSIS — R161 Splenomegaly, not elsewhere classified: Secondary | ICD-10-CM | POA: Diagnosis not present

## 2019-03-07 MED ORDER — IOHEXOL 300 MG/ML  SOLN
100.0000 mL | Freq: Once | INTRAMUSCULAR | Status: AC | PRN
  Administered 2019-03-07: 100 mL via INTRAVENOUS

## 2019-03-14 ENCOUNTER — Inpatient Hospital Stay (HOSPITAL_BASED_OUTPATIENT_CLINIC_OR_DEPARTMENT_OTHER): Payer: 59 | Admitting: Hematology

## 2019-03-14 ENCOUNTER — Encounter (HOSPITAL_COMMUNITY): Payer: Self-pay | Admitting: Hematology

## 2019-03-14 DIAGNOSIS — R161 Splenomegaly, not elsewhere classified: Secondary | ICD-10-CM | POA: Diagnosis not present

## 2019-03-14 DIAGNOSIS — D61818 Other pancytopenia: Secondary | ICD-10-CM

## 2019-03-14 MED FILL — CELECOXIB 200 MG CAP: 200 | 90 days supply | Qty: 90 | Fill #0

## 2019-03-14 MED FILL — buPROPion HCL ER (XL) 150 M: 150 | 90 days supply | Qty: 90 | Fill #0

## 2019-03-14 NOTE — Progress Notes (Signed)
Virtual Visit via Telephone Note  I connected with [name] Kelley on 03/14/19 at  3:50 PM EST by telephone and verified that I am speaking with the correct person using two identifiers.   I discussed the limitations, risks, security and privacy concerns of performing an evaluation and management service by telephone and the availability of in person appointments. I also discussed with the patient that there may be a patient responsible charge related to this service. The patient expressed understanding and agreed to proceed.   History of Present Illness: He was evaluated at our clinic for severe low white count and platelet count since at least May 2020.  He had a bone marrow biopsy done on 07/19/2018 which showed normocellular marrow for age with trilineage hematopoiesis with progressive maturation.  EBV serology was positive consistent with a possible recent infection.   Observations/Objective: He denies any fevers, night sweats or weight loss.  Reports low energy level.  Reports no recent infections.  States that he easily gets full and bloated.  No family history of splenomegaly.  Assessment and Plan:  1.  Pancytopenia and splenomegaly: -Recent blood work on 03/05/2019 shows white count 1.9, absolute neutrophil count 800.  Platelet count is low at 82.  Hemoglobin normal at 14.8. -We reviewed CT of the abdomen and pelvis with contrast dated 03/07/2019 which showed marked splenomegaly measuring 24 cm in craniocaudal direction.  Mild mesenteric lymphadenopathy with largest lymph node measuring 1.6 cm with no other lymphadenopathy. -He reported that he was told to have enlarged spleen at age 34.  He was told not to play sports at that time briefly. -However his CBC on 03/2016 were completely normal.  Hence I think the splenomegaly is a new process in the last 1 to 2 years. -I have recommended doing a PET CT scan to further evaluate the splenomegaly as well as mesenteric lymphadenopathy.  I will see him  back after the PET scan.   Follow Up Instructions: RTC after PET scan.   I discussed the assessment and treatment plan with the patient. The patient was provided an opportunity to ask questions and all were answered. The patient agreed with the plan and demonstrated an understanding of the instructions.   The patient was advised to call back or seek an in-person evaluation if the symptoms worsen or if the condition fails to improve as anticipated.  I provided 11 minutes of non-face-to-face time during this encounter.   Derek Jack, MD

## 2019-03-19 DIAGNOSIS — G3184 Mild cognitive impairment, so stated: Secondary | ICD-10-CM | POA: Diagnosis not present

## 2019-03-19 DIAGNOSIS — D709 Neutropenia, unspecified: Secondary | ICD-10-CM | POA: Diagnosis not present

## 2019-03-19 DIAGNOSIS — F338 Other recurrent depressive disorders: Secondary | ICD-10-CM | POA: Diagnosis not present

## 2019-03-19 MED FILL — DESVENLAFAXINE SUC ER 25 MG: 25 | 90 days supply | Qty: 90 | Fill #0 | Status: TO

## 2019-03-20 MED FILL — DESVENLAFAXINE SUCCINATE ER: 25 | 30 days supply | Qty: 30 | Fill #0

## 2019-04-02 ENCOUNTER — Encounter (HOSPITAL_COMMUNITY)
Admission: RE | Admit: 2019-04-02 | Discharge: 2019-04-02 | Disposition: A | Discharge status: Home / Self Care | Payer: 59 | Source: Ambulatory Visit | Attending: Hematology | Admitting: Hematology

## 2019-04-02 ENCOUNTER — Other Ambulatory Visit: Payer: Self-pay

## 2019-04-02 DIAGNOSIS — D61818 Other pancytopenia: Secondary | ICD-10-CM

## 2019-04-02 DIAGNOSIS — R161 Splenomegaly, not elsewhere classified: Secondary | ICD-10-CM

## 2019-04-02 MED ORDER — FLUDEOXYGLUCOSE F - 18 (FDG) INJECTION
15.9300 | Freq: Once | INTRAVENOUS | Status: AC | PRN
  Administered 2019-04-02: 15.93 via INTRAVENOUS

## 2019-04-03 ENCOUNTER — Inpatient Hospital Stay (HOSPITAL_COMMUNITY): Payer: 59 | Attending: Hematology | Admitting: Hematology

## 2019-04-03 ENCOUNTER — Encounter (HOSPITAL_COMMUNITY): Payer: Self-pay | Admitting: Hematology

## 2019-04-03 VITALS — BP 131/70 | HR 78 | Temp 97.0°F | Resp 19 | Wt 283.6 lb

## 2019-04-03 DIAGNOSIS — R6881 Early satiety: Secondary | ICD-10-CM | POA: Insufficient documentation

## 2019-04-03 DIAGNOSIS — Z79899 Other long term (current) drug therapy: Secondary | ICD-10-CM | POA: Insufficient documentation

## 2019-04-03 DIAGNOSIS — R161 Splenomegaly, not elsewhere classified: Secondary | ICD-10-CM | POA: Diagnosis not present

## 2019-04-03 DIAGNOSIS — Z791 Long term (current) use of non-steroidal anti-inflammatories (NSAID): Secondary | ICD-10-CM | POA: Diagnosis not present

## 2019-04-03 DIAGNOSIS — G479 Sleep disorder, unspecified: Secondary | ICD-10-CM | POA: Diagnosis not present

## 2019-04-03 DIAGNOSIS — R1310 Dysphagia, unspecified: Secondary | ICD-10-CM | POA: Diagnosis not present

## 2019-04-03 DIAGNOSIS — D61818 Other pancytopenia: Secondary | ICD-10-CM | POA: Diagnosis not present

## 2019-04-03 DIAGNOSIS — D696 Thrombocytopenia, unspecified: Secondary | ICD-10-CM | POA: Diagnosis not present

## 2019-04-03 DIAGNOSIS — R5383 Other fatigue: Secondary | ICD-10-CM | POA: Insufficient documentation

## 2019-04-03 NOTE — Assessment & Plan Note (Signed)
1.  Pancytopenia and splenomegaly: -He had pancytopenia since May 2020.  Nutrition and connective tissue disorder work-up was negative. -Bone marrow biopsy on 07/19/2018 showed normocellular marrow for age with trilineage hematopoiesis.  Progressive maturation.  Chromosome analysis was normal. -Peripheral blood flow cytometry was normal. -Infectious disease work-up for tick related diseases was normal.  EBV panel from June 2020 showed very increased VCA IgM and VCA IgG and EBNA IgG.  Interpretation was possible recent infection. -Blood work on 03/05/2019 showed white count 1.9 with absolute neutrophil count of 800.  Platelet count was 82.  He had normal blood counts in 03/2016. -He reported that he was told at age 34 that he had slightly bigger spleen.  No family history of storage disorders. -CT AP on 03/07/2019 showed marked splenomegaly measuring 24 cm in craniocaudal direction.  Mild mesenteric lymphadenopathy with largest lymph node measuring 1.6 cm with no other lymphadenopathy. -We reviewed results of the PET scan dated 04/02/2019 which showed splenomegaly with no focal spleen lesions or abnormal hypermetabolic him.  No suspicious lymphadenopathy.  Small jejunal mesentery lymph nodes are non-FDG avid. -We do not know the exact etiology of splenomegaly at this time.  We discussed various options including splenectomy for both diagnosis and treatment.  However we will watch his counts in 4 months.  He was told to come back sooner should he develop any recurrent infections, fevers night sweats or weight loss. -He does report more fatigue towards the end of the day.  His appetite also decreased slightly. -He also reported occasional difficulty swallowing with spasms in the upper neck region when he tries to eat or drink.  This does not happen always.  He thinks it started when he had intubation for his shoulder surgery.  If this problem gets worse, we can look into it by ordering a swallow study.

## 2019-04-03 NOTE — Progress Notes (Signed)
[name], [name] [address]   CLINIC:  Medical Oncology/Hematology  PCP:  [name] Squibb, MD [place] [address] [phone]   REASON FOR VISIT:  Follow-up for pancytopenia.   INTERVAL HISTORY:  [name] Kelley 34 y.o. male seen for follow-up of pancytopenia.  Reports tiredness towards evening.  He had Covid infection in October and hand sinus infection-like symptoms.  No other infections noted in the past 6 months.  Appetite is 50%.  Energy levels are 25%.  Reports difficulty swallowing episodes with both solids and liquids but does not happen all the time.  His weight has been stable.  He does not have a great appetite but makes himself eat.  Some early satiety present.    REVIEW OF SYSTEMS:  Review of Systems  HENT:   Positive for trouble swallowing.   Psychiatric/Behavioral: Positive for sleep disturbance.  All other systems reviewed and are negative.    PAST MEDICAL/SURGICAL HISTORY:  Past Medical History:  Diagnosis Date  . Medical history non-contributory    Past Surgical History:  Procedure Laterality Date  . Arm Manipulation Right 1995?  . INCISION AND DRAINAGE ABSCESS N/A 01/05/2013   Procedure: INCISION AND DRAINAGE PERIANAL  ABSCESS;  Surgeon: Jamesetta So, MD;  Location: [facility];  Service: General;  Laterality: N/A;  . ROTATOR CUFF REPAIR    . SHOULDER ARTHROSCOPY WITH OPEN ROTATOR CUFF REPAIR AND DISTAL CLAVICLE ACROMINECTOMY Left 11/02/2016   Procedure: LEFT SHOULDER ARTHROSCOPY WITH MINI OPEN ROTATOR CUFF REPAIR, OPEN BICEPS TENODESIS, AND DEBRIDEMENT;  Surgeon: Meredith Pel, MD;  Location: [facility];  Service: Orthopedics;  Laterality: Left;  . SHOULDER ARTHROSCOPY WITH SUBACROMIAL DECOMPRESSION, ROTATOR CUFF REPAIR AND BICEP TENDON REPAIR Right 12/09/2015   Procedure: SHOULDER DIAGNOSTIC OPERATIVE ARTHROSCOPY, SUBACROMIAL DECOMPRESSION, DISTAL CLAVICLE EXCISION, MINI-OPEN ROTATOR CUFF REPAIR AND BICEP  TENODESIS.;  Surgeon: Meredith Pel, MD;  Location: [facility];  Service: Orthopedics;  Laterality: Right;  . widsom teeth  2017     SOCIAL HISTORY:  Social History   Socioeconomic History  . Marital status: Married    Spouse name: Not on file  . Number of children: Not on file  . Years of education: Not on file  . Highest education level: Not on file  Occupational History  . Not on file  Tobacco Use  . Smoking status: Never Smoker  . Smokeless tobacco: Never Used  Substance and Sexual Activity  . Alcohol use: No  . Drug use: No  . Sexual activity: Not on file  Other Topics Concern  . Not on file  Social History Narrative  . Not on file   Social Determinants of Health   Financial Resource Strain:   . Difficulty of Paying Living Expenses: Not on file  Food Insecurity:   . Worried About Charity fundraiser in the Last Year: Not on file  . Ran Out of Food in the Last Year: Not on file  Transportation Needs:   . Lack of Transportation (Medical): Not on file  . Lack of Transportation (Non-Medical): Not on file  Physical Activity:   . Days of Exercise per Week: Not on file  . Minutes of Exercise per Session: Not on file  Stress:   . Feeling of Stress : Not on file  Social Connections:   . Frequency of Communication with Friends and Family: Not on file  . Frequency of Social Gatherings with Friends and Family: Not on  file  . Attends Religious Services: Not on file  . Active Member of Clubs or Organizations: Not on file  . Attends Archivist Meetings: Not on file  . Marital Status: Not on file  Intimate Partner Violence:   . Fear of Current or Ex-Partner: Not on file  . Emotionally Abused: Not on file  . Physically Abused: Not on file  . Sexually Abused: Not on file    FAMILY HISTORY:  Family History  Problem Relation Age of Onset  . Prostate cancer Father     CURRENT MEDICATIONS:  Outpatient Encounter Medications as of 04/03/2019  Medication Sig  .  buPROPion (WELLBUTRIN XL) 150 MG 24 hr tablet Take 150 mg by mouth daily.  . celecoxib (CELEBREX) 200 MG capsule Take 200 mg by mouth daily.  Marland Kitchen Desvenlafaxine Succinate ER 25 MG TB24 Take 1 tablet by mouth daily.   No facility-administered encounter medications on file as of 04/03/2019.    ALLERGIES:  Allergies  Allergen Reactions  . Hydrocodone Swelling    Tongue swelling     PHYSICAL EXAM:  ECOG Performance status: 1  Vitals:   04/03/19 1542  BP: 131/70  Pulse: 78  Resp: 19  Temp: (!) 97 F (36.1 C)  SpO2: 99%   Filed Weights   04/03/19 1542  Weight: 283 lb 9.6 oz (128.6 kg)    Physical Exam Vitals reviewed.  Constitutional:      Appearance: Normal appearance.  Cardiovascular:     Rate and Rhythm: Normal rate and regular rhythm.  Pulmonary:     Effort: Pulmonary effort is normal.     Breath sounds: Normal breath sounds.  Abdominal:     General: There is no distension.     Palpations: Abdomen is soft. There is no mass.     Comments: Splenomegaly palpable below the level of the umbilicus.  Musculoskeletal:        General: No swelling.  Lymphadenopathy:     Cervical: No cervical adenopathy.  Skin:    General: Skin is warm.  Neurological:     General: No focal deficit present.     Mental Status: He is alert and oriented to person, place, and time.  Psychiatric:        Mood and Affect: Mood normal.        Behavior: Behavior normal.      LABORATORY DATA:  I have reviewed the labs as listed.  CBC    Component Value Date/Time   WBC 1.9 (L) 03/05/2019 0908   RBC 4.98 03/05/2019 0908   HGB 14.8 03/05/2019 0908   HCT 44.5 03/05/2019 0908   PLT 82 (L) 03/05/2019 0908   MCV 89.4 03/05/2019 0908   MCH 29.7 03/05/2019 0908   MCHC 33.3 03/05/2019 0908   RDW 13.4 03/05/2019 0908   LYMPHSABS 0.7 03/05/2019 0908   MONOABS 0.2 03/05/2019 0908   EOSABS 0.1 03/05/2019 0908   BASOSABS 0.0 03/05/2019 0908   CMP Latest Ref Rng & Units 03/05/2019 08/02/2018  12/03/2015  Glucose 70 - 99 mg/dL 100(H) 118(H) 92  BUN 6 - 20 mg/dL _0 Creatinine 0.61 - 1.24 mg/dL 1.09 1.05 1.15  Sodium 135 - 145 mmol/L 137 140 138  Potassium 3.5 - 5.1 mmol/L 4.6 3.9 4.4  Chloride 98 - 111 mmol/L 107 109 106  CO2 22 - 32 mmol/L _1 Calcium 8.9 - 10.3 mg/dL 9.1 8.8(L) 9.0  Total Protein 6.5 - 8.1 g/dL 7.3 6.8 -  Total Bilirubin 0.3 - 1.2 mg/dL 1.1 1.0 -  Alkaline Phos 38 - 126 U/L 65 68 -  AST 15 - 41 U/L 23 35 -  ALT 0 - 44 U/L 30 43 -       DIAGNOSTIC IMAGING:  I have independently reviewed the scans and discussed with the patient.   I have reviewed Venita Lick LPN's note and agree with the documentation.  I personally performed a face-to-face visit, made revisions and my assessment and plan is as follows.    ASSESSMENT & PLAN:   Other pancytopenia ([place]) 1.  Pancytopenia and splenomegaly: -He had pancytopenia since May 2020.  Nutrition and connective tissue disorder work-up was negative. -Bone marrow biopsy on 07/19/2018 showed normocellular marrow for age with trilineage hematopoiesis.  Progressive maturation.  Chromosome analysis was normal. -Peripheral blood flow cytometry was normal. -Infectious disease work-up for tick related diseases was normal.  EBV panel from June 2020 showed very increased VCA IgM and VCA IgG and EBNA IgG.  Interpretation was possible recent infection. -Blood work on 03/05/2019 showed white count 1.9 with absolute neutrophil count of 800.  Platelet count was 82.  He had normal blood counts in 03/2016. -He reported that he was told at age 65 that he had slightly bigger spleen.  No family history of storage disorders. -CT AP on 03/07/2019 showed marked splenomegaly measuring 24 cm in craniocaudal direction.  Mild mesenteric lymphadenopathy with largest lymph node measuring 1.6 cm with no other lymphadenopathy. -We reviewed results of the PET scan dated 04/02/2019 which showed splenomegaly with no focal spleen lesions or  abnormal hypermetabolic him.  No suspicious lymphadenopathy.  Small jejunal mesentery lymph nodes are non-FDG avid. -We do not know the exact etiology of splenomegaly at this time.  We discussed various options including splenectomy for both diagnosis and treatment.  However we will watch his counts in 4 months.  He was told to come back sooner should he develop any recurrent infections, fevers night sweats or weight loss. -He does report more fatigue towards the end of the day.  His appetite also decreased slightly. -He also reported occasional difficulty swallowing with spasms in the upper neck region when he tries to eat or drink.  This does not happen always.  He thinks it started when he had intubation for his shoulder surgery.  If this problem gets worse, we can look into it by ordering a swallow study.   Orders placed this encounter:  Orders Placed This Encounter  Procedures  . CBC with Differential/Platelet  . Lactate dehydrogenase      Derek Jack, MD [place] [phone]

## 2019-04-03 NOTE — Patient Instructions (Addendum)
[facility] at [facility] Discharge Instructions  You were seen today by Dr. Katragadda. He went over your recent lab results. He will see you back in 4 months for labs and follow up.   Thank you for choosing [facility] at [facility] to provide your oncology and hematology care.  To afford each patient quality time with our provider, please arrive at least 15 minutes before your scheduled appointment time.   If you have a lab appointment with the Cancer Center please come in thru the  Main Entrance and check in at the main information desk  You need to re-schedule your appointment should you arrive 10 or more minutes late.  We strive to give you quality time with our providers, and arriving late affects you and other patients whose appointments are after yours.  Also, if you no show three or more times for appointments you may be dismissed from the clinic at the providers discretion.     Again, thank you for choosing [facility].  Our hope is that these requests will decrease the amount of time that you wait before being seen by our physicians.       _____________________________________________________________  Should you have questions after your visit to [facility], please contact our office at [phone] between the hours of 8:00 a.m. and 4:30 p.m.  Voicemails left after 4:00 p.m. will not be returned until the following business day.  For prescription refill requests, have your pharmacy contact our office and allow 72 hours.    Cancer Center Support Programs:   > Cancer Support Group  2nd Tuesday of the month 1pm-2pm, Journey Room    

## 2019-04-19 MED FILL — DESVENLAFAXINE SUCCINATE ER: 25 | 30 days supply | Qty: 30 | Fill #1

## 2019-05-03 DIAGNOSIS — G3184 Mild cognitive impairment, so stated: Secondary | ICD-10-CM | POA: Diagnosis not present

## 2019-05-03 DIAGNOSIS — F338 Other recurrent depressive disorders: Secondary | ICD-10-CM | POA: Diagnosis not present

## 2019-05-03 DIAGNOSIS — E6609 Other obesity due to excess calories: Secondary | ICD-10-CM | POA: Diagnosis not present

## 2019-05-03 DIAGNOSIS — D709 Neutropenia, unspecified: Secondary | ICD-10-CM | POA: Diagnosis not present

## 2019-05-10 MED FILL — DESVENLAFAXINE SUC ER 50 MG: 50 | 90 days supply | Qty: 90 | Fill #0

## 2019-06-01 MED FILL — buPROPion HCL ER (XL) 150 M: 150 | 90 days supply | Qty: 90 | Fill #1

## 2019-07-31 ENCOUNTER — Inpatient Hospital Stay (HOSPITAL_COMMUNITY): Payer: 59

## 2019-07-31 ENCOUNTER — Other Ambulatory Visit: Payer: Self-pay

## 2019-07-31 DIAGNOSIS — D696 Thrombocytopenia, unspecified: Secondary | ICD-10-CM

## 2019-07-31 DIAGNOSIS — R1312 Dysphagia, oropharyngeal phase: Secondary | ICD-10-CM | POA: Diagnosis not present

## 2019-07-31 LAB — CBC WITH DIFFERENTIAL/PLATELET
Abs Immature Granulocytes: 0.03 10*3/uL (ref 0.00–0.07)
Basophils Absolute: 0 10*3/uL (ref 0.0–0.1)
Basophils Relative: 1 %
Eosinophils Absolute: 0.1 10*3/uL (ref 0.0–0.5)
Eosinophils Relative: 5 %
HCT: 43 % (ref 39.0–52.0)
Hemoglobin: 14.5 g/dL (ref 13.0–17.0)
Immature Granulocytes: 1 %
Lymphocytes Relative: 31 %
Lymphs Abs: 0.8 10*3/uL (ref 0.7–4.0)
MCH: 29.7 pg (ref 26.0–34.0)
MCHC: 33.7 g/dL (ref 30.0–36.0)
MCV: 88.1 fL (ref 80.0–100.0)
Monocytes Absolute: 0.3 10*3/uL (ref 0.1–1.0)
Monocytes Relative: 11 %
Neutro Abs: 1.3 10*3/uL — ABNORMAL LOW (ref 1.7–7.7)
Neutrophils Relative %: 51 %
Platelets: 79 10*3/uL — ABNORMAL LOW (ref 150–400)
RBC: 4.88 MIL/uL (ref 4.22–5.81)
RDW: 13.1 % (ref 11.5–15.5)
WBC: 2.6 10*3/uL — ABNORMAL LOW (ref 4.0–10.5)
nRBC: 0 % (ref 0.0–0.2)

## 2019-07-31 LAB — LACTATE DEHYDROGENASE: LDH: 125 U/L (ref 98–192)

## 2019-08-01 DIAGNOSIS — D61818 Other pancytopenia: Secondary | ICD-10-CM | POA: Diagnosis not present

## 2019-08-01 DIAGNOSIS — R1310 Dysphagia, unspecified: Secondary | ICD-10-CM | POA: Diagnosis not present

## 2019-08-01 DIAGNOSIS — G3184 Mild cognitive impairment, so stated: Secondary | ICD-10-CM | POA: Diagnosis not present

## 2019-08-01 DIAGNOSIS — E6609 Other obesity due to excess calories: Secondary | ICD-10-CM | POA: Diagnosis not present

## 2019-08-01 DIAGNOSIS — F338 Other recurrent depressive disorders: Secondary | ICD-10-CM | POA: Diagnosis not present

## 2019-08-01 DIAGNOSIS — D709 Neutropenia, unspecified: Secondary | ICD-10-CM | POA: Diagnosis not present

## 2019-08-01 DIAGNOSIS — D6959 Other secondary thrombocytopenia: Secondary | ICD-10-CM | POA: Diagnosis not present

## 2019-08-02 ENCOUNTER — Encounter (HOSPITAL_COMMUNITY): Payer: Self-pay | Admitting: Speech Pathology

## 2019-08-06 ENCOUNTER — Other Ambulatory Visit (HOSPITAL_COMMUNITY): Payer: Self-pay | Admitting: Specialist

## 2019-08-06 DIAGNOSIS — K224 Dyskinesia of esophagus: Secondary | ICD-10-CM

## 2019-08-07 ENCOUNTER — Ambulatory Visit (HOSPITAL_COMMUNITY): Payer: 59 | Admitting: Oncology

## 2019-08-13 ENCOUNTER — Encounter (HOSPITAL_COMMUNITY): Payer: Self-pay | Admitting: Speech Pathology

## 2019-08-13 ENCOUNTER — Other Ambulatory Visit: Payer: Self-pay

## 2019-08-13 ENCOUNTER — Inpatient Hospital Stay (HOSPITAL_BASED_OUTPATIENT_CLINIC_OR_DEPARTMENT_OTHER): Payer: 59 | Admitting: Nurse Practitioner

## 2019-08-13 ENCOUNTER — Ambulatory Visit (HOSPITAL_COMMUNITY): Payer: 59 | Attending: Internal Medicine | Admitting: Speech Pathology

## 2019-08-13 ENCOUNTER — Ambulatory Visit (HOSPITAL_COMMUNITY)
Admission: RE | Admit: 2019-08-13 | Discharge: 2019-08-13 | Disposition: A | Discharge status: Home / Self Care | Payer: 59 | Source: Ambulatory Visit | Attending: Internal Medicine | Admitting: Internal Medicine

## 2019-08-13 DIAGNOSIS — K224 Dyskinesia of esophagus: Secondary | ICD-10-CM | POA: Insufficient documentation

## 2019-08-13 DIAGNOSIS — D61818 Other pancytopenia: Secondary | ICD-10-CM

## 2019-08-13 DIAGNOSIS — R1312 Dysphagia, oropharyngeal phase: Secondary | ICD-10-CM | POA: Diagnosis not present

## 2019-08-13 NOTE — Assessment & Plan Note (Signed)
1.  Pancytopenia and splenomegaly: -He had pancytopenia since May 2020. -Nutrition and connective tissue disorder work-up was negative. -Bone marrow biopsy on 07/19/2018 showed normocellular marrow for age with trilineage hematopoiesis.  Progressive maturation.  Chromosome analysis was normal. -Peripheral blood flow cytometry was normal. -Infectious disease work-up for tick related diseases was normal.  EBV panel from June 2020 showed very increased VCA IgM and VCA IgG and EBNA IgG.  Interpretation was possible recent infection. -He reported that he was told at age 34 that he had slightly bigger spleen.  No family history of storage disorder. -CTAP on 03/07/2019 showed marked splenomegaly measuring 24 cm in craniocaudal direction.  Mild mesenteric lymphadenopathy with the largest lymph node measuring 1.6 cm and no other lymphadenopathy. -PET scan dated on 04/02/2019 showed splenomegaly with no focal spleen lesions or abnormal hypermetabolism.  No suspicious lymphadenopathy.  Small jejunal mesentery lymph nodes are non-FDG avid. -We do not know the exact etiology of splenomegaly at this time.  We have discussed various options including splenomegaly for both diagnosis and treatment. -He does report more fatigue towards the end of the day.  His appetite is also slightly decreased.  He feels full all the time. -He will come back sooner if he develops any recurrent infections, fevers night sweats or weight loss. -He will follow-up in 4 months with repeat labs.

## 2019-08-13 NOTE — Therapy (Signed)
[name] Kelley, [place], [address] Phone: [phone]   Fax:  [phone]  Modified Barium Swallow  Patient Details  Name: [name] Kelley MRN: [id number] Date of Birth: 1985-06-12 No data recorded  Encounter Date: 08/13/2019   End of Session - 08/13/19 1346    Visit Number 1    Number of Visits 1    Authorization Type [name] Kelley [employer]    SLP Start Time 1138    SLP Stop Time  1210    SLP Time Calculation (min) 32 min    Activity Tolerance Patient tolerated treatment well           Past Medical History:  Diagnosis Date  . Medical history non-contributory     Past Surgical History:  Procedure Laterality Date  . Arm Manipulation Right 1995?  . INCISION AND DRAINAGE ABSCESS N/A 01/05/2013   Procedure: INCISION AND DRAINAGE PERIANAL  ABSCESS;  Surgeon: [name] So, MD;  Location: [facility];  Service: General;  Laterality: N/A;  . ROTATOR CUFF REPAIR    . SHOULDER ARTHROSCOPY WITH OPEN ROTATOR CUFF REPAIR AND DISTAL CLAVICLE ACROMINECTOMY Left 11/02/2016   Procedure: LEFT SHOULDER ARTHROSCOPY WITH MINI OPEN ROTATOR CUFF REPAIR, OPEN BICEPS TENODESIS, AND DEBRIDEMENT;  Surgeon: [name] Pel, MD;  Location: [place];  Service: Orthopedics;  Laterality: Left;  . SHOULDER ARTHROSCOPY WITH SUBACROMIAL DECOMPRESSION, ROTATOR CUFF REPAIR AND BICEP TENDON REPAIR Right 12/09/2015   Procedure: SHOULDER DIAGNOSTIC OPERATIVE ARTHROSCOPY, SUBACROMIAL DECOMPRESSION, DISTAL CLAVICLE EXCISION, MINI-OPEN ROTATOR CUFF REPAIR AND BICEP TENODESIS.;  Surgeon: [name] Pel, MD;  Location: [place];  Service: Orthopedics;  Laterality: Right;  . widsom teeth  2017    There were no vitals filed for this visit.     General - 08/13/19 1339      General Information   Date of Onset 08/02/19    HPI [name] Kelley is a 34 yo male who was referred for MBSS by his PCP, Dr. Celene Kelley due to Pt reports of dysphagia. He is followed by Dr.  Delton Kelley for pancytopenia.  Reports tiredness towards evening.  He had Covid infection in October 2020. Appetite is 50%.  Energy levels are 25%.  Reports difficulty swallowing episodes with both solids and liquids but does not happen all the time.  His weight has been stable.  He does not have a great appetite but makes himself eat. EBV panel from June 2020 showed very increased VCA IgM and VCA IgG and EBNA IgG.  Interpretation was possible recent infection. [name] Kelley feels that his swallowing difficulties started after his shoulder surgery in 2019. He described an incident at a restaurant where he had to regurgitate foods in the bathroom.    Type of Study MBS-Modified Barium Swallow Study    Previous Swallow Assessment None on record    Diet Prior to this Study Regular;Thin liquids    Temperature Spikes Noted No    Respiratory Status Room air    History of Recent Intubation No    Behavior/Cognition Alert;Cooperative;Pleasant mood    Oral Cavity Assessment Within Functional Limits    Oral Care Completed by SLP No    Oral Cavity - Dentition Adequate natural dentition    Vision Functional for self feeding    Self-Feeding Abilities Able to feed self    Patient Positioning Upright in chair    Baseline Vocal Quality Normal    Volitional Cough Strong    Volitional Swallow Able to elicit  Anatomy Within functional limits    Pharyngeal Secretions Not observed secondary MBS              Oral Preparation/Oral Phase - 08/13/19 1340      Oral Preparation/Oral Phase   Oral Phase Within functional limits      Electrical stimulation - Oral Phase   Was Electrical Stimulation Used No            Pharyngeal Phase - 08/13/19 1340      Pharyngeal Phase   Pharyngeal Phase Within functional limits      Pharyngeal - Thin   Pharyngeal- Thin Teaspoon Within functional limits    Pharyngeal- Thin Cup Within functional limits;Penetration/Aspiration during swallow   flash penetration x1    Pharyngeal Material does not enter airway;Material enters airway, remains ABOVE vocal cords then ejected out    Pharyngeal- Thin Straw Within functional limits      Pharyngeal - Solids   Pharyngeal- Puree Within functional limits    Pharyngeal- Regular Within functional limits    Pharyngeal- Pill Within functional limits      Electrical Stimulation - Pharyngeal Phase   Was Electrical Stimulation Used No            Cricopharyngeal Phase - 08/13/19 1342      Cervical Esophageal Phase   Cervical Esophageal Phase Impaired      Cervical Esophageal Phase - Comment   Other Esophageal Phase Observations Esophageal sweep revealed patulous esophagus with some backflow of puree bolus             Plan - 08/13/19 1347    Clinical Impression Statement Pt presents with normal oropharyngeal swallow when assessed with barium tinged thin via tsp/cup/straw, puree, regular textures, and pill with cup thin. Swallow trigger was timely, one episode of flash penetration of thins via cup sip, and no significant residuals noted. Esophageal sweep completed in AP position revealed patulous esophagus with some backflow/retrograde bolus movement. Pharyngeal transit was noted to be bilateral when Pt was turned AP. Pt's symptoms may be due to esophageal component and may require GI consult and/or esophageal assessment. Pt also indicates Right pharyngeal "scratchiness" and globus at times and may benefit from ENT consult to visualize pharynx and larynx. Pt was encouraged to implement reflux precautions and try to see if warm liquids before meals may help to relax his esophagus and decrease his symptoms. He was also encouraged to keep a food log to list problematic foods/times of day to see if there is a pattern. No further SLP services indicated at this time.    Treatment/Interventions Patient/family education;SLP instruction and feedback    Potential to Achieve Goals Good    Consulted and Agree with Plan of Care  Patient           Patient will benefit from skilled therapeutic intervention in order to improve the following deficits and impairments:   Dysphagia, oropharyngeal phase     Recommendations/Treatment - 08/13/19 1343      Swallow Evaluation Recommendations   Recommended Consults Consider ENT evaluation;Consider GI evaluation    SLP Diet Recommendations Age appropriate regular;Thin    Liquid Administration via Cup;Straw    Medication Administration Whole meds with liquid    Supervision Patient able to self feed    Compensations Follow solids with liquid    Postural Changes Seated upright at 90 degrees;Remain upright for at least 30 minutes after feeds/meals            Prognosis - 08/13/19 1345  Prognosis   Prognosis for Safe Diet Advancement Good      Individuals Consulted   Consulted and Agree with Results and Recommendations Patient    Report Sent to  Referring physician           Problem List Patient Active Problem List   Diagnosis Date Noted  . Splenomegaly 04/03/2019  . Other pancytopenia ([facility]) 08/02/2018  . Complete tear of right rotator cuff 02/05/2016  . Complete rotator cuff tear of left shoulder 02/05/2016   Thank you,  Havery Moros, CCC-SLP [phone]  [facility] 08/13/2019, 1:50 PM  [facility] [address] [place], [place], [address] Phone: [phone]   Fax:  [phone]  Name: [name] Kelley MRN: [id number] Date of Birth: [birth year]

## 2019-08-13 NOTE — Progress Notes (Signed)
[facility] [address] [place] [address]   CLINIC:  Medical Oncology/Hematology  PCP:  Celene Squibb, MD [place] [address] [phone]   REASON FOR VISIT: Follow-up for pancytopenia  CURRENT THERAPY: Observation    INTERVAL HISTORY:  [name] Kelley 34 y.o. male returns for routine follow-up for pancytopenia.  Patient reports he is doing well since last visit.  He does report fatigue during the afternoons.  He reports extreme fatigue after a strenuous day at work. Denies any nausea, vomiting, or diarrhea. Denies any new pains. Had not noticed any recent bleeding such as epistaxis, hematuria or hematochezia. Denies recent chest pain on exertion, shortness of breath on minimal exertion, pre-syncopal episodes, or palpitations. Denies any numbness or tingling in hands or feet. Denies any recent fevers, infections, or recent hospitalizations. Patient reports appetite at 25% and energy level at 0%.    REVIEW OF SYSTEMS:  Review of Systems  Constitutional: Positive for fatigue.  Respiratory: Positive for cough and shortness of breath.   Cardiovascular: Positive for leg swelling.  Gastrointestinal: Positive for diarrhea, nausea and vomiting.  Neurological: Positive for dizziness, headaches and numbness.  Psychiatric/Behavioral: Positive for sleep disturbance.  All other systems reviewed and are negative.    PAST MEDICAL/SURGICAL HISTORY:  Past Medical History:  Diagnosis Date  . Medical history non-contributory    Past Surgical History:  Procedure Laterality Date  . Arm Manipulation Right 1995?  . INCISION AND DRAINAGE ABSCESS N/A 01/05/2013   Procedure: INCISION AND DRAINAGE PERIANAL  ABSCESS;  Surgeon: Jamesetta So, MD;  Location: [facility];  Service: General;  Laterality: N/A;  . ROTATOR CUFF REPAIR    . SHOULDER ARTHROSCOPY WITH OPEN ROTATOR CUFF REPAIR AND DISTAL CLAVICLE ACROMINECTOMY Left 11/02/2016   Procedure: LEFT SHOULDER  ARTHROSCOPY WITH MINI OPEN ROTATOR CUFF REPAIR, OPEN BICEPS TENODESIS, AND DEBRIDEMENT;  Surgeon: Meredith Pel, MD;  Location: [facility];  Service: Orthopedics;  Laterality: Left;  . SHOULDER ARTHROSCOPY WITH SUBACROMIAL DECOMPRESSION, ROTATOR CUFF REPAIR AND BICEP TENDON REPAIR Right 12/09/2015   Procedure: SHOULDER DIAGNOSTIC OPERATIVE ARTHROSCOPY, SUBACROMIAL DECOMPRESSION, DISTAL CLAVICLE EXCISION, MINI-OPEN ROTATOR CUFF REPAIR AND BICEP TENODESIS.;  Surgeon: Meredith Pel, MD;  Location: [facility];  Service: Orthopedics;  Laterality: Right;  . widsom teeth  2017     SOCIAL HISTORY:  Social History   Socioeconomic History  . Marital status: Married    Spouse name: Not on file  . Number of children: Not on file  . Years of education: Not on file  . Highest education level: Not on file  Occupational History  . Not on file  Tobacco Use  . Smoking status: Never Smoker  . Smokeless tobacco: Never Used  Vaping Use  . Vaping Use: Never used  Substance and Sexual Activity  . Alcohol use: No  . Drug use: No  . Sexual activity: Not on file  Other Topics Concern  . Not on file  Social History Narrative  . Not on file   Social Determinants of Health   Financial Resource Strain:   . Difficulty of Paying Living Expenses:   Food Insecurity:   . Worried About Charity fundraiser in the Last Year:   . Arboriculturist in the Last Year:   Transportation Needs:   . Film/video editor (Medical):   Marland Kitchen Lack of Transportation (Non-Medical):   Physical Activity:   . Days of Exercise per Week:   .  Minutes of Exercise per Session:   Stress:   . Feeling of Stress :   Social Connections:   . Frequency of Communication with Friends and Family:   . Frequency of Social Gatherings with Friends and Family:   . Attends Religious Services:   . Active Member of Clubs or Organizations:   . Attends Archivist Meetings:   Marland Kitchen Marital Status:   Intimate Partner Violence:   . Fear of  Current or Ex-Partner:   . Emotionally Abused:   Marland Kitchen Physically Abused:   . Sexually Abused:     FAMILY HISTORY:  Family History  Problem Relation Age of Onset  . Prostate cancer Father     CURRENT MEDICATIONS:  Outpatient Encounter Medications as of 08/13/2019  Medication Sig  . buPROPion (WELLBUTRIN XL) 150 MG 24 hr tablet Take 150 mg by mouth daily.  . celecoxib (CELEBREX) 200 MG capsule Take 200 mg by mouth daily.  Marland Kitchen Desvenlafaxine Succinate ER 25 MG TB24 Take 1 tablet by mouth daily.   No facility-administered encounter medications on file as of 08/13/2019.    ALLERGIES:  Allergies  Allergen Reactions  . Hydrocodone Swelling    Tongue swelling     PHYSICAL EXAM:  ECOG Performance status: 1  Vitals:   08/13/19 0952  BP: 120/77  Pulse: 76  Resp: 20  Temp: 97.7 F (36.5 C)  SpO2: 100%   Filed Weights   08/13/19 0952  Weight: 272 lb 8 oz (123.6 kg)   Physical Exam Constitutional:      Appearance: Normal appearance. He is normal weight.  Musculoskeletal:        General: Normal range of motion.  Skin:    General: Skin is warm.  Neurological:     Mental Status: He is alert and oriented to person, place, and time. Mental status is at baseline.  Psychiatric:        Mood and Affect: Mood normal.        Behavior: Behavior normal.        Thought Content: Thought content normal.        Judgment: Judgment normal.      LABORATORY DATA:  I have reviewed the labs as listed.  CBC    Component Value Date/Time   WBC 2.6 (L) 07/31/2019 1328   RBC 4.88 07/31/2019 1328   HGB 14.5 07/31/2019 1328   HCT 43.0 07/31/2019 1328   PLT 79 (L) 07/31/2019 1328   MCV 88.1 07/31/2019 1328   MCH 29.7 07/31/2019 1328   MCHC 33.7 07/31/2019 1328   RDW 13.1 07/31/2019 1328   LYMPHSABS 0.8 07/31/2019 1328   MONOABS 0.3 07/31/2019 1328   EOSABS 0.1 07/31/2019 1328   BASOSABS 0.0 07/31/2019 1328   CMP Latest Ref Rng & Units 03/05/2019 08/02/2018 12/03/2015  Glucose 70 - 99  mg/dL 100(H) 118(H) 92  BUN 6 - 20 mg/dL '18 18 12  '$ Creatinine 0.61 - 1.24 mg/dL 1.09 1.05 1.15  Sodium 135 - 145 mmol/L 137 140 138  Potassium 3.5 - 5.1 mmol/L 4.6 3.9 4.4  Chloride 98 - 111 mmol/L 107 109 106  CO2 22 - 32 mmol/L '24 25 27  '$ Calcium 8.9 - 10.3 mg/dL 9.1 8.8(L) 9.0  Total Protein 6.5 - 8.1 g/dL 7.3 6.8 -  Total Bilirubin 0.3 - 1.2 mg/dL 1.1 1.0 -  Alkaline Phos 38 - 126 U/L 65 68 -  AST 15 - 41 U/L 23 35 -  ALT 0 - 44 U/L 30 43 -  All questions were answered to patient's stated satisfaction. Encouraged patient to call with any new concerns or questions before his next visit to the cancer center and we can certain see him sooner, if needed.     ASSESSMENT & PLAN:  Other pancytopenia ([place]) 1.  Pancytopenia and splenomegaly: -He had pancytopenia since May 2020. -Nutrition and connective tissue disorder work-up was negative. -Bone marrow biopsy on 07/19/2018 showed normocellular marrow for age with trilineage hematopoiesis.  Progressive maturation.  Chromosome analysis was normal. -Peripheral blood flow cytometry was normal. -Infectious disease work-up for tick related diseases was normal.  EBV panel from June 2020 showed very increased VCA IgM and VCA IgG and EBNA IgG.  Interpretation was possible recent infection. -He reported that he was told at age 64 that he had slightly bigger spleen.  No family history of storage disorder. -CTAP on 03/07/2019 showed marked splenomegaly measuring 24 cm in craniocaudal direction.  Mild mesenteric lymphadenopathy with the largest lymph node measuring 1.6 cm and no other lymphadenopathy. -PET scan dated on 04/02/2019 showed splenomegaly with no focal spleen lesions or abnormal hypermetabolism.  No suspicious lymphadenopathy.  Small jejunal mesentery lymph nodes are non-FDG avid. -We do not know the exact etiology of splenomegaly at this time.  We have discussed various options including splenomegaly for both diagnosis and treatment. -He does  report more fatigue towards the end of the day.  His appetite is also slightly decreased.  He feels full all the time. -He will come back sooner if he develops any recurrent infections, fevers night sweats or weight loss. -He will follow-up in 4 months with repeat labs.     Orders placed this encounter:  Orders Placed This Encounter  Procedures  . Lactate dehydrogenase  . CBC with Differential/Platelet  . Comprehensive metabolic panel      Francene Finders, FNP-C [facility] [phone]

## 2019-08-15 DIAGNOSIS — Z23 Encounter for immunization: Secondary | ICD-10-CM | POA: Diagnosis not present

## 2019-08-16 DIAGNOSIS — E875 Hyperkalemia: Secondary | ICD-10-CM | POA: Diagnosis not present

## 2019-08-16 DIAGNOSIS — F338 Other recurrent depressive disorders: Secondary | ICD-10-CM | POA: Diagnosis not present

## 2019-08-16 DIAGNOSIS — E782 Mixed hyperlipidemia: Secondary | ICD-10-CM | POA: Diagnosis not present

## 2019-08-16 DIAGNOSIS — E785 Hyperlipidemia, unspecified: Secondary | ICD-10-CM | POA: Diagnosis not present

## 2019-08-16 DIAGNOSIS — D709 Neutropenia, unspecified: Secondary | ICD-10-CM | POA: Diagnosis not present

## 2019-08-16 DIAGNOSIS — G3184 Mild cognitive impairment, so stated: Secondary | ICD-10-CM | POA: Diagnosis not present

## 2019-08-16 DIAGNOSIS — E876 Hypokalemia: Secondary | ICD-10-CM | POA: Diagnosis not present

## 2019-08-16 DIAGNOSIS — D696 Thrombocytopenia, unspecified: Secondary | ICD-10-CM | POA: Diagnosis not present

## 2019-08-16 DIAGNOSIS — E6609 Other obesity due to excess calories: Secondary | ICD-10-CM | POA: Diagnosis not present

## 2019-08-20 DIAGNOSIS — G4700 Insomnia, unspecified: Secondary | ICD-10-CM | POA: Diagnosis not present

## 2019-08-20 DIAGNOSIS — D696 Thrombocytopenia, unspecified: Secondary | ICD-10-CM | POA: Diagnosis not present

## 2019-08-20 DIAGNOSIS — M545 Low back pain: Secondary | ICD-10-CM | POA: Diagnosis not present

## 2019-08-20 DIAGNOSIS — M25572 Pain in left ankle and joints of left foot: Secondary | ICD-10-CM | POA: Diagnosis not present

## 2019-08-20 DIAGNOSIS — Z0001 Encounter for general adult medical examination with abnormal findings: Secondary | ICD-10-CM | POA: Diagnosis not present

## 2019-08-20 DIAGNOSIS — M542 Cervicalgia: Secondary | ICD-10-CM | POA: Diagnosis not present

## 2019-08-20 DIAGNOSIS — F338 Other recurrent depressive disorders: Secondary | ICD-10-CM | POA: Diagnosis not present

## 2019-08-20 DIAGNOSIS — E785 Hyperlipidemia, unspecified: Secondary | ICD-10-CM | POA: Diagnosis not present

## 2019-08-20 DIAGNOSIS — E875 Hyperkalemia: Secondary | ICD-10-CM | POA: Diagnosis not present

## 2019-08-22 ENCOUNTER — Encounter: Payer: Self-pay | Admitting: Gastroenterology

## 2019-09-19 DIAGNOSIS — J069 Acute upper respiratory infection, unspecified: Secondary | ICD-10-CM | POA: Diagnosis not present

## 2019-10-04 MED FILL — DESVENLAFAXINE SUC ER 50 MG: 50 | 90 days supply | Qty: 90 | Fill #1

## 2019-10-04 MED FILL — buPROPion HCL ER (XL) 150 M: 150 | 90 days supply | Qty: 90 | Fill #2

## 2019-10-16 DIAGNOSIS — R1310 Dysphagia, unspecified: Secondary | ICD-10-CM | POA: Insufficient documentation

## 2019-10-17 ENCOUNTER — Ambulatory Visit: Payer: 59 | Admitting: Gastroenterology

## 2019-11-26 ENCOUNTER — Emergency Department (HOSPITAL_COMMUNITY): Payer: 59

## 2019-11-26 ENCOUNTER — Other Ambulatory Visit (HOSPITAL_COMMUNITY): Payer: 59

## 2019-11-26 ENCOUNTER — Observation Stay (HOSPITAL_COMMUNITY)
Admission: EM | Admit: 2019-11-26 | Discharge: 2019-11-27 | Disposition: A | Discharge status: Home / Self Care | Payer: 59 | Attending: Emergency Medicine | Admitting: Emergency Medicine

## 2019-11-26 ENCOUNTER — Other Ambulatory Visit: Payer: Self-pay

## 2019-11-26 ENCOUNTER — Encounter (HOSPITAL_COMMUNITY): Payer: Self-pay | Admitting: *Deleted

## 2019-11-26 DIAGNOSIS — R188 Other ascites: Principal | ICD-10-CM | POA: Insufficient documentation

## 2019-11-26 DIAGNOSIS — Z20822 Contact with and (suspected) exposure to covid-19: Secondary | ICD-10-CM | POA: Insufficient documentation

## 2019-11-26 DIAGNOSIS — R079 Chest pain, unspecified: Secondary | ICD-10-CM | POA: Diagnosis not present

## 2019-11-26 DIAGNOSIS — R52 Pain, unspecified: Secondary | ICD-10-CM | POA: Diagnosis not present

## 2019-11-26 DIAGNOSIS — J80 Acute respiratory distress syndrome: Secondary | ICD-10-CM | POA: Diagnosis not present

## 2019-11-26 DIAGNOSIS — M542 Cervicalgia: Secondary | ICD-10-CM | POA: Diagnosis not present

## 2019-11-26 DIAGNOSIS — S161XXA Strain of muscle, fascia and tendon at neck level, initial encounter: Secondary | ICD-10-CM | POA: Diagnosis not present

## 2019-11-26 DIAGNOSIS — R519 Headache, unspecified: Secondary | ICD-10-CM | POA: Diagnosis not present

## 2019-11-26 DIAGNOSIS — S3991XA Unspecified injury of abdomen, initial encounter: Secondary | ICD-10-CM

## 2019-11-26 DIAGNOSIS — Z041 Encounter for examination and observation following transport accident: Secondary | ICD-10-CM | POA: Diagnosis not present

## 2019-11-26 DIAGNOSIS — M25511 Pain in right shoulder: Secondary | ICD-10-CM | POA: Diagnosis not present

## 2019-11-26 DIAGNOSIS — Y9389 Activity, other specified: Secondary | ICD-10-CM | POA: Insufficient documentation

## 2019-11-26 DIAGNOSIS — M25519 Pain in unspecified shoulder: Secondary | ICD-10-CM | POA: Diagnosis not present

## 2019-11-26 DIAGNOSIS — R109 Unspecified abdominal pain: Secondary | ICD-10-CM | POA: Diagnosis not present

## 2019-11-26 DIAGNOSIS — R161 Splenomegaly, not elsewhere classified: Secondary | ICD-10-CM | POA: Insufficient documentation

## 2019-11-26 LAB — APTT: aPTT: 27 seconds (ref 24–36)

## 2019-11-26 LAB — CBC WITH DIFFERENTIAL/PLATELET
Abs Immature Granulocytes: 0.04 10*3/uL (ref 0.00–0.07)
Basophils Absolute: 0 10*3/uL (ref 0.0–0.1)
Basophils Relative: 1 %
Eosinophils Absolute: 0.1 10*3/uL (ref 0.0–0.5)
Eosinophils Relative: 4 %
HCT: 41.1 % (ref 39.0–52.0)
Hemoglobin: 14.1 g/dL (ref 13.0–17.0)
Immature Granulocytes: 2 %
Lymphocytes Relative: 25 %
Lymphs Abs: 0.5 10*3/uL — ABNORMAL LOW (ref 0.7–4.0)
MCH: 29.6 pg (ref 26.0–34.0)
MCHC: 34.3 g/dL (ref 30.0–36.0)
MCV: 86.2 fL (ref 80.0–100.0)
Monocytes Absolute: 0.3 10*3/uL (ref 0.1–1.0)
Monocytes Relative: 15 %
Neutro Abs: 1.1 10*3/uL — ABNORMAL LOW (ref 1.7–7.7)
Neutrophils Relative %: 53 %
Platelets: 83 10*3/uL — ABNORMAL LOW (ref 150–400)
RBC: 4.77 MIL/uL (ref 4.22–5.81)
RDW: 13.2 % (ref 11.5–15.5)
WBC: 2.1 10*3/uL — ABNORMAL LOW (ref 4.0–10.5)
nRBC: 0 % (ref 0.0–0.2)

## 2019-11-26 LAB — RESPIRATORY PANEL BY RT PCR (FLU A&B, COVID)
Influenza A by PCR: NEGATIVE
Influenza B by PCR: NEGATIVE
SARS Coronavirus 2 by RT PCR: NEGATIVE

## 2019-11-26 LAB — BASIC METABOLIC PANEL
Anion gap: 7 (ref 5–15)
BUN: 17 mg/dL (ref 6–20)
CO2: 24 mmol/L (ref 22–32)
Calcium: 8.8 mg/dL — ABNORMAL LOW (ref 8.9–10.3)
Chloride: 103 mmol/L (ref 98–111)
Creatinine, Ser: 1.12 mg/dL (ref 0.61–1.24)
GFR, Estimated: >60 mL/min (ref >60)
Glucose, Bld: 105 mg/dL — ABNORMAL HIGH (ref 70–99)
Potassium: 4.2 mmol/L (ref 3.5–5.1)
Sodium: 134 mmol/L — ABNORMAL LOW (ref 135–145)

## 2019-11-26 LAB — TYPE AND SCREEN
ABO/RH(D): A POS
Antibody Screen: NEGATIVE

## 2019-11-26 LAB — HEPATIC FUNCTION PANEL
ALT: 29 U/L (ref 0–44)
AST: 22 U/L (ref 15–41)
Albumin: 4.3 g/dL (ref 3.5–5.0)
Alkaline Phosphatase: 60 U/L (ref 38–126)
Bilirubin, Direct: 0.1 mg/dL (ref 0.0–0.2)
Indirect Bilirubin: 0.9 mg/dL (ref 0.3–0.9)
Total Bilirubin: 1 mg/dL (ref 0.3–1.2)
Total Protein: 7 g/dL (ref 6.5–8.1)

## 2019-11-26 LAB — LACTIC ACID, PLASMA: Lactic Acid, Venous: 0.9 mmol/L (ref 0.5–1.9)

## 2019-11-26 LAB — PROTIME-INR
INR: 1.1 (ref 0.8–1.2)
Prothrombin Time: 13.6 seconds (ref 11.4–15.2)

## 2019-11-26 LAB — SAMPLE TO BLOOD BANK

## 2019-11-26 LAB — HIV ANTIBODY (ROUTINE TESTING W REFLEX): HIV Screen 4th Generation wRfx: NONREACTIVE

## 2019-11-26 MED ORDER — IOHEXOL 300 MG/ML  SOLN
100.0000 mL | Freq: Once | INTRAMUSCULAR | Status: AC | PRN
  Administered 2019-11-26: 100 mL via INTRAVENOUS

## 2019-11-26 MED ORDER — ACETAMINOPHEN 325 MG PO TABS
650.0000 mg | ORAL_TABLET | Freq: Four times a day (QID) | ORAL | Status: DC
  Administered 2019-11-26 – 2019-11-27 (×2): 650 mg via ORAL
  Filled 2019-11-26 (×3): qty 2

## 2019-11-26 MED ORDER — SODIUM CHLORIDE 0.9 % IV BOLUS
500.0000 mL | Freq: Once | INTRAVENOUS | Status: AC
  Administered 2019-11-26: 500 mL via INTRAVENOUS

## 2019-11-26 MED ORDER — OXYCODONE HCL 5 MG PO TABS: 5.0000 mg | ORAL_TABLET | ORAL | Status: DC | PRN

## 2019-11-26 MED ORDER — ONDANSETRON HCL 4 MG/2ML IJ SOLN: 4.0000 mg | Freq: Four times a day (QID) | INTRAMUSCULAR | Status: DC | PRN

## 2019-11-26 MED ORDER — ONDANSETRON 4 MG PO TBDP: 4.0000 mg | ORAL_TABLET | Freq: Four times a day (QID) | ORAL | Status: DC | PRN

## 2019-11-26 MED ORDER — HYDROMORPHONE HCL 1 MG/ML IJ SOLN: 0.5000 mg | INTRAMUSCULAR | Status: DC | PRN

## 2019-11-26 MED ORDER — SODIUM CHLORIDE 0.9% FLUSH
3.0000 mL | Freq: Two times a day (BID) | INTRAVENOUS | Status: DC
  Administered 2019-11-26: 3 mL via INTRAVENOUS

## 2019-11-26 MED ORDER — SODIUM CHLORIDE 0.9 % IV SOLN: 250.0000 mL | INTRAVENOUS | Status: DC | PRN

## 2019-11-26 MED ORDER — SODIUM CHLORIDE 0.9% FLUSH: 3.0000 mL | INTRAVENOUS | Status: DC | PRN

## 2019-11-26 MED ORDER — FENTANYL CITRATE (PF) 100 MCG/2ML IJ SOLN
50.0000 ug | Freq: Once | INTRAMUSCULAR | Status: AC
  Administered 2019-11-26: 50 ug via INTRAVENOUS
  Filled 2019-11-26: qty 2

## 2019-11-26 MED ORDER — METHOCARBAMOL 500 MG PO TABS
500.0000 mg | ORAL_TABLET | Freq: Four times a day (QID) | ORAL | Status: DC | PRN
  Administered 2019-11-26: 500 mg via ORAL
  Filled 2019-11-26: qty 1

## 2019-11-26 MED ORDER — DIPHENHYDRAMINE HCL 25 MG PO CAPS: 25.0000 mg | ORAL_CAPSULE | Freq: Four times a day (QID) | ORAL | Status: DC | PRN

## 2019-11-26 MED ORDER — METOPROLOL TARTRATE 5 MG/5ML IV SOLN: 5.0000 mg | Freq: Four times a day (QID) | INTRAVENOUS | Status: DC | PRN

## 2019-11-26 MED ORDER — HYDRALAZINE HCL 20 MG/ML IJ SOLN: 10.0000 mg | INTRAMUSCULAR | Status: DC | PRN

## 2019-11-26 NOTE — ED Notes (Signed)
Trauma surgery at the bedside assessing pt.
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
## 2019-11-26 NOTE — ED Notes (Signed)
Attempted report x1. 
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
## 2019-11-27 DIAGNOSIS — S161XXA Strain of muscle, fascia and tendon at neck level, initial encounter: Secondary | ICD-10-CM | POA: Diagnosis not present

## 2019-11-27 DIAGNOSIS — R519 Headache, unspecified: Secondary | ICD-10-CM | POA: Diagnosis not present

## 2019-11-27 DIAGNOSIS — R161 Splenomegaly, not elsewhere classified: Secondary | ICD-10-CM | POA: Diagnosis not present

## 2019-11-27 DIAGNOSIS — M542 Cervicalgia: Secondary | ICD-10-CM | POA: Diagnosis not present

## 2019-11-27 DIAGNOSIS — R188 Other ascites: Secondary | ICD-10-CM | POA: Diagnosis not present

## 2019-11-27 DIAGNOSIS — Z20822 Contact with and (suspected) exposure to covid-19: Secondary | ICD-10-CM | POA: Diagnosis not present

## 2019-11-27 DIAGNOSIS — M25511 Pain in right shoulder: Secondary | ICD-10-CM | POA: Diagnosis not present

## 2019-11-27 DIAGNOSIS — R109 Unspecified abdominal pain: Secondary | ICD-10-CM | POA: Diagnosis not present

## 2019-11-27 LAB — BASIC METABOLIC PANEL
Anion gap: 7 (ref 5–15)
BUN: 14 mg/dL (ref 6–20)
CO2: 25 mmol/L (ref 22–32)
Calcium: 8.9 mg/dL (ref 8.9–10.3)
Chloride: 105 mmol/L (ref 98–111)
Creatinine, Ser: 1.17 mg/dL (ref 0.61–1.24)
GFR, Estimated: >60 mL/min (ref >60)
Glucose, Bld: 96 mg/dL (ref 70–99)
Potassium: 3.8 mmol/L (ref 3.5–5.1)
Sodium: 137 mmol/L (ref 135–145)

## 2019-11-27 LAB — CBC
HCT: 39.4 % (ref 39.0–52.0)
Hemoglobin: 13.2 g/dL (ref 13.0–17.0)
MCH: 29.3 pg (ref 26.0–34.0)
MCHC: 33.5 g/dL (ref 30.0–36.0)
MCV: 87.4 fL (ref 80.0–100.0)
Platelets: 71 10*3/uL — ABNORMAL LOW (ref 150–400)
RBC: 4.51 MIL/uL (ref 4.22–5.81)
RDW: 13.2 % (ref 11.5–15.5)
WBC: 1.7 10*3/uL — ABNORMAL LOW (ref 4.0–10.5)
nRBC: 0 % (ref 0.0–0.2)

## 2019-11-27 MED ORDER — ACETAMINOPHEN 500 MG PO TABS: 1000.0000 mg | ORAL_TABLET | Freq: Four times a day (QID) | ORAL | Status: AC

## 2019-11-27 MED ORDER — METHOCARBAMOL 500 MG PO TABS: 500.0000 mg | ORAL_TABLET | Freq: Four times a day (QID) | ORAL | 0 refills | Status: AC | PRN

## 2019-11-27 NOTE — Plan of Care (Signed)
  Problem: Education: Goal: Knowledge of General Education information will improve Description: Including pain rating scale, medication(s)/side effects and non-pharmacologic comfort measures Outcome: Progressing   Problem: Clinical Measurements: Goal: Will remain free from infection Outcome: Progressing Goal: Respiratory complications will improve Outcome: Progressing Goal: Cardiovascular complication will be avoided Outcome: Progressing   Problem: Activity: Goal: Risk for activity intolerance will decrease Outcome: Progressing   Problem: Nutrition: Goal: Adequate nutrition will be maintained Outcome: Progressing   Problem: Coping: Goal: Level of anxiety will decrease Outcome: Progressing   Problem: Elimination: Goal: Will not experience complications related to bowel motility Outcome: Progressing Goal: Will not experience complications related to urinary retention Outcome: Progressing   Problem: Pain Managment: Goal: General experience of comfort will improve Outcome: Progressing   Problem: Safety: Goal: Ability to remain free from injury will improve Outcome: Progressing   Problem: Skin Integrity: Goal: Risk for impaired skin integrity will decrease Outcome: Progressing   
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
## 2019-11-27 NOTE — Discharge Instructions (Signed)
Cervical Sprain  A cervical sprain is a stretch or tear in the tissues that connect bones (ligaments) in the neck. Most neck (cervical) sprains get better in 4-6 weeks. Follow these instructions at home: If you have a neck collar:  Wear it as told by your doctor. Do not take off (do not remove) the collar unless your doctor says that this is safe.  Ask your doctor before adjusting your collar.  If you have long hair, keep it outside of the collar.  Ask your doctor if you may take off the collar for cleaning and bathing. If you may take off the collar: ? Follow instructions from your doctor about how to take off the collar safely. ? Clean the collar by wiping it with mild soap and water. Let it air-dry all the way. ? If your collar has removable pads:  Take the pads out every 1-2 days.  Hand wash the pads with soap and water.  Let the pads air-dry all the way before you put them back in the collar. Do not dry them in a clothes dryer. Do not dry them with a hair dryer. ? Check your skin under the collar for irritation or sores. If you see any, tell your doctor. Managing pain, stiffness, and swelling   Use a cervical traction device, if told by your doctor.  If told, put heat on the affected area. Do this before exercises (physical therapy) or as often as told by your doctor. Use the heat source that your doctor recommends, such as a moist heat pack or a heating pad. ? Place a towel between your skin and the heat source. ? Leave the heat on for 20-30 minutes. ? Take the heat off (remove the heat) if your skin turns bright red. This is very important if you cannot feel pain, heat, or cold. You may have a greater risk of getting burned.  Put ice on the affected area. ? Put ice in a plastic bag. ? Place a towel between your skin and the bag. ? Leave the ice on for 20 minutes, 2-3 times a day. Activity  Do not drive while wearing a neck collar. If you do not have a neck collar, ask  your doctor if it is safe to drive.  Do not drive or use heavy machinery while taking prescription pain medicine or muscle relaxants, unless your doctor approves.  Do not lift anything that is heavier than 10 lb (4.5 kg) until your doctor tells you that it is safe.  Rest as told by your doctor.  Avoid activities that make you feel worse. Ask your doctor what activities are safe for you.  Do exercises as told by your doctor or physical therapist. Preventing neck sprain  Practice good posture. Adjust your workstation to help with this, if needed.  Exercise regularly as told by your doctor or physical therapist.  Avoid activities that are risky or may cause a neck sprain (cervical sprain). General instructions  Take over-the-counter and prescription medicines only as told by your doctor.  Do not use any products that contain nicotine or tobacco. This includes cigarettes and e-cigarettes. If you need help quitting, ask your doctor.  Keep all follow-up visits as told by your doctor. This is important. Contact a doctor if:  You have pain or other symptoms that get worse.  You have symptoms that do not get better after 2 weeks.  You have pain that does not get better with medicine.  You start to   have new, unexplained symptoms.  You have sores or irritated skin from wearing your neck collar. Get help right away if:  You have very bad pain.  You have any of the following in any part of your body: ? Loss of feeling (numbness). ? Tingling. ? Weakness.  You cannot move a part of your body (you have paralysis).  Your activity level does not improve. Summary  A cervical sprain is a stretch or tear in the tissues that connect bones (ligaments) in the neck.  If you have a neck (cervical) collar, do not take off the collar unless your doctor says that this is safe.  Put ice on affected areas as told by your doctor.  Put heat on affected areas as told by your doctor.  Good  posture and regular exercise can help prevent a neck sprain from happening again. This information is not intended to replace advice given to you by your health care provider. Make sure you discuss any questions you have with your health care provider. Document Revised: 05/24/2018 Document Reviewed: 10/14/2015 Elsevier Patient Education  2020 Elsevier Inc.  

## 2019-12-04 DIAGNOSIS — M542 Cervicalgia: Secondary | ICD-10-CM | POA: Diagnosis not present

## 2019-12-04 DIAGNOSIS — M25561 Pain in right knee: Secondary | ICD-10-CM | POA: Diagnosis not present

## 2019-12-07 ENCOUNTER — Other Ambulatory Visit: Payer: Self-pay

## 2019-12-07 ENCOUNTER — Ambulatory Visit (INDEPENDENT_AMBULATORY_CARE_PROVIDER_SITE_OTHER): Payer: 59 | Admitting: Orthopedic Surgery

## 2019-12-07 DIAGNOSIS — Z9889 Other specified postprocedural states: Secondary | ICD-10-CM

## 2019-12-07 DIAGNOSIS — M542 Cervicalgia: Secondary | ICD-10-CM | POA: Diagnosis not present

## 2019-12-07 DIAGNOSIS — M25511 Pain in right shoulder: Secondary | ICD-10-CM | POA: Diagnosis not present

## 2019-12-07 MED ORDER — PREDNISONE 5 MG (21) PO TBPK: ORAL_TABLET | ORAL | 0 refills | Status: DC

## 2019-12-07 MED ORDER — METHOCARBAMOL 500 MG PO TABS: 500.0000 mg | ORAL_TABLET | Freq: Three times a day (TID) | ORAL | 0 refills | Status: AC | PRN

## 2019-12-09 ENCOUNTER — Encounter: Payer: Self-pay | Admitting: Orthopedic Surgery
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
## 2019-12-11 ENCOUNTER — Encounter: Payer: Self-pay | Admitting: Orthopedic Surgery

## 2019-12-20 ENCOUNTER — Other Ambulatory Visit (HOSPITAL_COMMUNITY): Payer: Self-pay

## 2019-12-20 DIAGNOSIS — D696 Thrombocytopenia, unspecified: Secondary | ICD-10-CM

## 2019-12-20 DIAGNOSIS — D61818 Other pancytopenia: Secondary | ICD-10-CM

## 2019-12-21 ENCOUNTER — Inpatient Hospital Stay (HOSPITAL_COMMUNITY): Payer: 59 | Attending: Hematology

## 2019-12-28 ENCOUNTER — Ambulatory Visit (HOSPITAL_COMMUNITY): Payer: 59 | Admitting: Oncology

## 2020-01-04 ENCOUNTER — Ambulatory Visit (INDEPENDENT_AMBULATORY_CARE_PROVIDER_SITE_OTHER): Payer: 59 | Admitting: Orthopedic Surgery

## 2020-01-04 ENCOUNTER — Encounter: Payer: Self-pay | Admitting: Orthopedic Surgery

## 2020-01-04 DIAGNOSIS — M25511 Pain in right shoulder: Secondary | ICD-10-CM | POA: Diagnosis not present
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
## 2020-01-24 ENCOUNTER — Telehealth: Payer: Self-pay | Admitting: Orthopedic Surgery

## 2020-01-24 NOTE — Telephone Encounter (Signed)
Sure thx

## 2020-01-24 NOTE — Telephone Encounter (Signed)
Send him to PT?

## 2020-01-24 NOTE — Telephone Encounter (Signed)
Patient called asked if Dr August Saucer can recommend a provider for him to get a  physical test (FCE)? The number to contact patient is [phone]

## 2020-01-25 NOTE — Telephone Encounter (Signed)
Spoke with patient and discussed.

## 2020-02-11 ENCOUNTER — Encounter: Payer: Self-pay | Admitting: Orthopedic Surgery

## 2020-02-11 DIAGNOSIS — M25511 Pain in right shoulder: Secondary | ICD-10-CM

## 2020-02-11 DIAGNOSIS — Z9889 Other specified postprocedural states: Secondary | ICD-10-CM

## 2020-02-18 ENCOUNTER — Other Ambulatory Visit: Payer: Self-pay

## 2020-02-18 ENCOUNTER — Ambulatory Visit (INDEPENDENT_AMBULATORY_CARE_PROVIDER_SITE_OTHER): Payer: 59 | Admitting: Orthopedic Surgery

## 2020-02-18 ENCOUNTER — Encounter: Payer: Self-pay | Admitting: Orthopedic Surgery

## 2020-02-18 DIAGNOSIS — M79601 Pain in right arm: Secondary | ICD-10-CM

## 2020-02-18 DIAGNOSIS — Z8739 Personal history of other diseases of the musculoskeletal system and connective tissue: Secondary | ICD-10-CM

## 2020-02-18 DIAGNOSIS — Z9889 Other specified postprocedural states: Secondary | ICD-10-CM | POA: Diagnosis not present
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
## 2020-02-25 DIAGNOSIS — J069 Acute upper respiratory infection, unspecified: Secondary | ICD-10-CM | POA: Diagnosis not present

## 2020-03-06 ENCOUNTER — Encounter: Payer: Self-pay | Admitting: Orthopedic Surgery

## 2020-03-18 ENCOUNTER — Other Ambulatory Visit (HOSPITAL_COMMUNITY): Payer: Self-pay | Admitting: Internal Medicine

## 2020-03-18 MED FILL — DESVENLAFAXINE SUC ER 50 MG: 50 | 90 days supply | Qty: 90 | Fill #0

## 2020-03-18 MED FILL — buPROPion HCL ER (XL) 150 M: 150 | 90 days supply | Qty: 90 | Fill #0

## 2020-03-19 DIAGNOSIS — M25572 Pain in left ankle and joints of left foot: Secondary | ICD-10-CM | POA: Diagnosis not present

## 2020-03-19 DIAGNOSIS — F338 Other recurrent depressive disorders: Secondary | ICD-10-CM | POA: Diagnosis not present

## 2020-03-19 DIAGNOSIS — R1319 Other dysphagia: Secondary | ICD-10-CM | POA: Diagnosis not present

## 2020-03-19 DIAGNOSIS — M25561 Pain in right knee: Secondary | ICD-10-CM | POA: Diagnosis not present

## 2020-03-19 DIAGNOSIS — Z0000 Encounter for general adult medical examination without abnormal findings: Secondary | ICD-10-CM | POA: Diagnosis not present

## 2020-03-19 DIAGNOSIS — D696 Thrombocytopenia, unspecified: Secondary | ICD-10-CM | POA: Diagnosis not present

## 2020-03-19 DIAGNOSIS — Z712 Person consulting for explanation of examination or test findings: Secondary | ICD-10-CM | POA: Diagnosis not present

## 2020-03-19 DIAGNOSIS — R0981 Nasal congestion: Secondary | ICD-10-CM | POA: Diagnosis not present

## 2020-03-28 DIAGNOSIS — D709 Neutropenia, unspecified: Secondary | ICD-10-CM | POA: Diagnosis not present

## 2020-03-28 DIAGNOSIS — D696 Thrombocytopenia, unspecified: Secondary | ICD-10-CM | POA: Diagnosis not present

## 2020-03-28 DIAGNOSIS — M25572 Pain in left ankle and joints of left foot: Secondary | ICD-10-CM | POA: Diagnosis not present

## 2020-03-28 DIAGNOSIS — R1319 Other dysphagia: Secondary | ICD-10-CM | POA: Diagnosis not present

## 2020-03-28 DIAGNOSIS — F338 Other recurrent depressive disorders: Secondary | ICD-10-CM | POA: Diagnosis not present

## 2020-03-28 DIAGNOSIS — E785 Hyperlipidemia, unspecified: Secondary | ICD-10-CM | POA: Diagnosis not present

## 2020-03-28 DIAGNOSIS — G4700 Insomnia, unspecified: Secondary | ICD-10-CM | POA: Diagnosis not present

## 2020-03-28 DIAGNOSIS — M542 Cervicalgia: Secondary | ICD-10-CM | POA: Diagnosis not present

## 2020-03-28 DIAGNOSIS — E875 Hyperkalemia: Secondary | ICD-10-CM | POA: Diagnosis not present

## 2020-04-02 ENCOUNTER — Ambulatory Visit
Admission: RE | Admit: 2020-04-02 | Discharge: 2020-04-02 | Disposition: A | Discharge status: Home / Self Care | Payer: 59 | Source: Ambulatory Visit | Attending: Orthopedic Surgery | Admitting: Orthopedic Surgery

## 2020-04-02 ENCOUNTER — Other Ambulatory Visit: Payer: Self-pay

## 2020-04-02 DIAGNOSIS — M79601 Pain in right arm: Secondary | ICD-10-CM

## 2020-04-02 DIAGNOSIS — M25511 Pain in right shoulder: Secondary | ICD-10-CM | POA: Diagnosis not present

## 2020-04-02 DIAGNOSIS — G8929 Other chronic pain: Secondary | ICD-10-CM | POA: Diagnosis not present

## 2020-04-02 DIAGNOSIS — M47812 Spondylosis without myelopathy or radiculopathy, cervical region: Secondary | ICD-10-CM | POA: Diagnosis not present

## 2020-04-02 DIAGNOSIS — M19011 Primary osteoarthritis, right shoulder: Secondary | ICD-10-CM | POA: Diagnosis not present

## 2020-04-02 DIAGNOSIS — M5023 Other cervical disc displacement, cervicothoracic region: Secondary | ICD-10-CM | POA: Diagnosis not present

## 2020-04-02 MED ORDER — IOPAMIDOL (ISOVUE-M 200) INJECTION 41%
12.0000 mL | Freq: Once | INTRAMUSCULAR | Status: AC
  Administered 2020-04-02: 12 mL via INTRA_ARTICULAR

## 2020-04-07 ENCOUNTER — Other Ambulatory Visit: Payer: Self-pay

## 2020-04-07 ENCOUNTER — Ambulatory Visit (INDEPENDENT_AMBULATORY_CARE_PROVIDER_SITE_OTHER): Payer: 59 | Admitting: Orthopedic Surgery

## 2020-04-07 DIAGNOSIS — M79601 Pain in right arm: Secondary | ICD-10-CM

## 2020-04-08 ENCOUNTER — Encounter: Payer: Self-pay | Admitting: Orthopedic Surgery
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
## 2020-04-10 ENCOUNTER — Telehealth: Payer: Self-pay

## 2020-04-10 NOTE — Telephone Encounter (Signed)
Called [facility] b/c and pt was approve

## 2020-04-10 NOTE — Telephone Encounter (Signed)
Danne Harbor from [facility] called regarding ESI for patient she would like to know what the ESI level is call back:[phone] ext.[phone]

## 2020-04-17 ENCOUNTER — Ambulatory Visit: Payer: 59 | Admitting: Physical Medicine and Rehabilitation

## 2020-04-23 ENCOUNTER — Ambulatory Visit (INDEPENDENT_AMBULATORY_CARE_PROVIDER_SITE_OTHER): Payer: 59 | Admitting: Physical Medicine and Rehabilitation

## 2020-04-23 ENCOUNTER — Other Ambulatory Visit: Payer: Self-pay

## 2020-04-23 ENCOUNTER — Ambulatory Visit: Payer: Self-pay

## 2020-04-23 ENCOUNTER — Encounter: Payer: Self-pay | Admitting: Physical Medicine and Rehabilitation

## 2020-04-23 VITALS — BP 122/71 | HR 67

## 2020-04-23 DIAGNOSIS — M5412 Radiculopathy, cervical region: Secondary | ICD-10-CM | POA: Diagnosis not present

## 2020-04-23 MED ORDER — BETAMETHASONE SOD PHOS & ACET 6 (3-3) MG/ML IJ SUSP
12.0000 mg | Freq: Once | INTRAMUSCULAR | Status: AC
  Administered 2020-04-23: 12 mg

## 2020-04-23 NOTE — Progress Notes (Signed)
Pt state neck pain that travels down his back. Pt state turning and moving his neck makes the pain worse. Pt state it feels like a pull on his spine. Pt state he takes pain meds to help ease the pain.  Numeric Pain Rating Scale and Functional Assessment Average Pain 2   In the last MONTH (on 0-10 scale) has pain interfered with the following?  1. General activity like being  able to carry out your everyday physical activities such as walking, climbing stairs, carrying groceries, or moving a chair?  Rating(8)   +Driver, -BT, -Dye Allergies.

## 2020-04-23 NOTE — Patient Instructions (Signed)

## 2020-04-24 NOTE — Progress Notes (Signed)
[name] Kelley - 35 y.o. male MRN [id number]  Date of birth: Jul 15, 1985  Office Visit Note: Visit Date: 04/23/2020 PCP: Benita Stabile, MD Referred by: Benita Stabile, MD  Subjective: Chief Complaint  Patient presents with  . Neck - Pain   HPI:  [name] Kelley is a 35 y.o. male who comes in today at the request of Dr. Burnard Bunting for planned Left C7-T1 Cervical epidural steroid injection with fluoroscopic guidance.  The patient has failed conservative care including home exercise, medications, time and activity modification.  This injection will be diagnostic and hopefully therapeutic.  Please see requesting physician notes for further details and justification. MRI reviewed with images and spine model.  MRI reviewed in the note below.  Neck pain is generalized about the C7 spinous process and seems to be muscular in that area but he does get referral pattern into the right arm and left arm and is can go back and forth and can fluctuate.  Having more left-sided symptoms recently.  In general epidural space in the cervical spine should allow for medication really to spread to both sides up and down several levels.    ROS Otherwise per HPI.  Assessment & Plan: Visit Diagnoses:    ICD-10-CM   1. Cervical radiculopathy  M54.12 XR C-ARM NO REPORT    Epidural Steroid injection    betamethasone acetate-betamethasone sodium phosphate (CELESTONE) injection 12 mg    Plan: No additional findings.   Meds & Orders:  Meds ordered this encounter  Medications  . betamethasone acetate-betamethasone sodium phosphate (CELESTONE) injection 12 mg    Orders Placed This Encounter  Procedures  . XR C-ARM NO REPORT  . Epidural Steroid injection    Follow-up: Return for visit to requesting physician as needed.   Procedures: No procedures performed  Cervical Epidural Steroid Injection - Interlaminar Approach with Fluoroscopic Guidance  Patient: [name] Kelley      Date of Birth: 1985/03/30 MRN:  [id number] PCP: Benita Stabile, MD      Visit Date: 04/23/2020   Universal Protocol:    Date/Time: 04/25/2210:57 PM  Consent Given By: the patient  Position: PRONE  Additional Comments: Vital signs were monitored before and after the procedure. Patient was prepped and draped in the usual sterile fashion. The correct patient, procedure, and site was verified.   Injection Procedure Details:   Procedure diagnoses: Cervical radiculopathy [M54.12]    Meds Administered:  Meds ordered this encounter  Medications  . betamethasone acetate-betamethasone sodium phosphate (CELESTONE) injection 12 mg     Laterality: Left  Location/Site: C7-T1  Needle: 3.5 in., 20 ga. Tuohy  Needle Placement: Paramedian epidural space  Findings:  -Comments: Excellent flow of contrast into the epidural space.  Procedure Details: Using a paramedian approach from the side mentioned above, the region overlying the inferior lamina was localized under fluoroscopic visualization and the soft tissues overlying this structure were infiltrated with 4 ml. of 1% Lidocaine without Epinephrine. A # 20 gauge, Tuohy needle was inserted into the epidural space using a paramedian approach.  The epidural space was localized using loss of resistance along with contralateral oblique bi-planar fluoroscopic views.  After negative aspirate for air, blood, and CSF, a 2 ml. volume of Isovue-250 was injected into the epidural space and the flow of contrast was observed. Radiographs were obtained for documentation purposes.   The injectate was administered into the level noted above.  Additional Comments:  The patient tolerated the procedure well Dressing:  2 x 2 sterile gauze and Band-Aid    Post-procedure details: Patient was observed during the procedure. Post-procedure instructions were reviewed.  Patient left the clinic in stable condition.     Clinical History: MRI CERVICAL SPINE WITHOUT  CONTRAST  TECHNIQUE: Multiplanar, multisequence MR imaging of the cervical spine was performed. No intravenous contrast was administered.  COMPARISON:  Cervical spine MRI 03/13/2017. Cervical spine CT 11/26/2019.  FINDINGS: Alignment: Chronic straightening of cervical lordosis, stable since 2019.  Vertebrae: Faint degenerative endplate marrow edema at C6 and C7 (series 7, image 9). Superimposed C6 benign vertebral hemangioma. Background bone marrow signal stable and within normal limits.  Cord: No convincing abnormal cervical spinal cord signal. Capacious spinal canal above C4-C5. See details below.  Posterior Fossa, vertebral arteries, paraspinal tissues: Cervicomedullary junction is within normal limits. Negative visible posterior fossa. Preserved major vascular flow voids in the neck. Right vertebral artery has a late entry into the cervical transverse foramen (normal variant). Negative visible neck soft tissues, right lung apex.  Disc levels:  C2-C3:  Stable and negative.  C3-C4: Mild foraminal endplate spurring greater on the left. Borderline to mild left C4 neural foraminal stenosis is stable.  C4-C5: Disc space loss. Circumferential disc bulge and endplate spurring. Broad-based posterior component of disc appears mildly increased since 2019. Small central annular fissure again suspected (series 8, image 15). Increased borderline to mild spinal stenosis. No foraminal stenosis.  C5-C6: Chronic disc space loss and disc desiccation. Central and slightly caudal disc herniation here has regressed since 2019, with a more broad-based posterior component now (series 8, image 20). Continued mild spinal stenosis. Mild mass effect on the ventral spinal cord is regressed. No C6 foraminal stenosis.  C6-C7: Left paracentral annular fissure and subtle disc protrusion in 2019 have progressed to a small to moderate size disc extrusion now (series 6, image 9 and series  9, image 25). Underlying broad-based left paracentral component of disc (series 9, image 24). New mild spinal stenosis and mild left hemi cord mass effect. But no convincing C7 neural foraminal stenosis.  C7-T1: Mild left paracentral disc bulging and possible subtle annular fissure (series 6, image 10) are increased. Increased mild facet hypertrophy. But no spinal or right neural foraminal stenosis. Borderline to mild left C8 foraminal stenosis is increased.  No visible upper thoracic stenosis.  IMPRESSION: 1. Cervical spine degeneration has progressed since 2019 at C6-C7, but new/increased disc herniation there is eccentric to the left. Subsequent new mild spinal stenosis and mild cord mass effect. No cord signal abnormality.  2. Regressed C5-C6 disc herniation since 2019 with improved mild spinal stenosis there. Increased leftward disc and facet disease at C7-T1 with up to mild new left C8 foraminal stenosis.  3. But no convincing right side neural impingement to explain right upper extremity symptoms.   Electronically Signed   By: Odessa Fleming M.D.   On: 04/03/2020 07:28     Objective:  VS:  HT:    WT:   BMI:     BP:122/71  HR:67bpm  TEMP: ( )  RESP:  Physical Exam Vitals and nursing note reviewed.  Constitutional:      General: He is not in acute distress.    Appearance: Normal appearance. He is not ill-appearing.  HENT:     Head: Normocephalic and atraumatic.     Right Ear: External ear normal.     Left Ear: External ear normal.  Eyes:     Extraocular Movements: Extraocular movements intact.  Cardiovascular:  Rate and Rhythm: Normal rate.     Pulses: Normal pulses.  Abdominal:     General: There is no distension.     Palpations: Abdomen is soft.  Musculoskeletal:        General: No signs of injury.     Cervical back: Neck supple. Tenderness present. No rigidity.     Right lower leg: No edema.     Left lower leg: No edema.     Comments: Patient  has good strength in the upper extremities with 5 out of 5 strength in wrist extension long finger flexion APB.  No intrinsic hand muscle atrophy.  Negative Hoffmann's test.  Lymphadenopathy:     Cervical: No cervical adenopathy.  Skin:    Findings: No erythema or rash.  Neurological:     General: No focal deficit present.     Mental Status: He is alert and oriented to person, place, and time.     Sensory: No sensory deficit.     Motor: No weakness or abnormal muscle tone.     Coordination: Coordination normal.  Psychiatric:        Mood and Affect: Mood normal.        Behavior: Behavior normal.      Imaging: XR C-ARM NO REPORT  Result Date: 04/23/2020 Please see Notes tab for imaging impression.

## 2020-04-24 NOTE — Procedures (Signed)
Cervical Epidural Steroid Injection - Interlaminar Approach with Fluoroscopic Guidance  Patient: [name] Kelley      Date of Birth: 1985-07-21 MRN: [id number] PCP: Benita Stabile, MD      Visit Date: 04/23/2020   Universal Protocol:    Date/Time: 04/25/2210:57 PM  Consent Given By: the patient  Position: PRONE  Additional Comments: Vital signs were monitored before and after the procedure. Patient was prepped and draped in the usual sterile fashion. The correct patient, procedure, and site was verified.   Injection Procedure Details:   Procedure diagnoses: Cervical radiculopathy [M54.12]    Meds Administered:  Meds ordered this encounter  Medications  . betamethasone acetate-betamethasone sodium phosphate (CELESTONE) injection 12 mg     Laterality: Left  Location/Site: C7-T1  Needle: 3.5 in., 20 ga. Tuohy  Needle Placement: Paramedian epidural space  Findings:  -Comments: Excellent flow of contrast into the epidural space.  Procedure Details: Using a paramedian approach from the side mentioned above, the region overlying the inferior lamina was localized under fluoroscopic visualization and the soft tissues overlying this structure were infiltrated with 4 ml. of 1% Lidocaine without Epinephrine. A # 20 gauge, Tuohy needle was inserted into the epidural space using a paramedian approach.  The epidural space was localized using loss of resistance along with contralateral oblique bi-planar fluoroscopic views.  After negative aspirate for air, blood, and CSF, a 2 ml. volume of Isovue-250 was injected into the epidural space and the flow of contrast was observed. Radiographs were obtained for documentation purposes.   The injectate was administered into the level noted above.  Additional Comments:  The patient tolerated the procedure well Dressing: 2 x 2 sterile gauze and Band-Aid    Post-procedure details: Patient was observed during the procedure. Post-procedure  instructions were reviewed.  Patient left the clinic in stable condition.

## 2020-05-07 ENCOUNTER — Ambulatory Visit (INDEPENDENT_AMBULATORY_CARE_PROVIDER_SITE_OTHER): Payer: 59 | Admitting: Orthopedic Surgery

## 2020-05-07 ENCOUNTER — Encounter: Payer: Self-pay | Admitting: Orthopedic Surgery

## 2020-05-07 DIAGNOSIS — M79601 Pain in right arm: Secondary | ICD-10-CM
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
## 2020-05-08 ENCOUNTER — Other Ambulatory Visit (HOSPITAL_BASED_OUTPATIENT_CLINIC_OR_DEPARTMENT_OTHER): Payer: Self-pay

## 2020-09-04 DIAGNOSIS — H52223 Regular astigmatism, bilateral: Secondary | ICD-10-CM | POA: Diagnosis not present

## 2020-12-23 ENCOUNTER — Other Ambulatory Visit (HOSPITAL_COMMUNITY): Payer: Self-pay | Admitting: Internal Medicine

## 2020-12-23 ENCOUNTER — Other Ambulatory Visit: Payer: Self-pay | Admitting: Internal Medicine

## 2020-12-23 DIAGNOSIS — M546 Pain in thoracic spine: Secondary | ICD-10-CM | POA: Diagnosis not present

## 2020-12-23 DIAGNOSIS — M545 Low back pain, unspecified: Secondary | ICD-10-CM | POA: Diagnosis not present

## 2020-12-25 ENCOUNTER — Other Ambulatory Visit (HOSPITAL_COMMUNITY): Payer: Self-pay | Admitting: Internal Medicine

## 2020-12-25 DIAGNOSIS — M546 Pain in thoracic spine: Secondary | ICD-10-CM

## 2021-02-03 DIAGNOSIS — S81011A Laceration without foreign body, right knee, initial encounter: Secondary | ICD-10-CM | POA: Diagnosis not present

## 2021-02-03 DIAGNOSIS — S71111A Laceration without foreign body, right thigh, initial encounter: Secondary | ICD-10-CM | POA: Diagnosis not present

## 2021-02-03 DIAGNOSIS — E782 Mixed hyperlipidemia: Secondary | ICD-10-CM | POA: Diagnosis not present

## 2021-02-03 DIAGNOSIS — W270XXA Contact with workbench tool, initial encounter: Secondary | ICD-10-CM | POA: Diagnosis not present

## 2021-02-11 ENCOUNTER — Other Ambulatory Visit (HOSPITAL_COMMUNITY): Payer: Self-pay

## 2021-02-11 ENCOUNTER — Other Ambulatory Visit: Payer: Self-pay | Admitting: Internal Medicine

## 2021-02-11 ENCOUNTER — Other Ambulatory Visit (HOSPITAL_COMMUNITY): Payer: Self-pay | Admitting: Internal Medicine

## 2021-02-11 DIAGNOSIS — M25572 Pain in left ankle and joints of left foot: Secondary | ICD-10-CM | POA: Diagnosis not present

## 2021-02-11 DIAGNOSIS — D709 Neutropenia, unspecified: Secondary | ICD-10-CM | POA: Diagnosis not present

## 2021-02-11 DIAGNOSIS — F5104 Psychophysiologic insomnia: Secondary | ICD-10-CM | POA: Diagnosis not present

## 2021-02-11 DIAGNOSIS — M75121 Complete rotator cuff tear or rupture of right shoulder, not specified as traumatic: Secondary | ICD-10-CM | POA: Diagnosis not present

## 2021-02-11 DIAGNOSIS — E875 Hyperkalemia: Secondary | ICD-10-CM | POA: Diagnosis not present

## 2021-02-11 DIAGNOSIS — E782 Mixed hyperlipidemia: Secondary | ICD-10-CM | POA: Diagnosis not present

## 2021-02-11 DIAGNOSIS — D6949 Other primary thrombocytopenia: Secondary | ICD-10-CM | POA: Diagnosis not present

## 2021-02-11 DIAGNOSIS — M545 Low back pain, unspecified: Secondary | ICD-10-CM

## 2021-02-11 DIAGNOSIS — M542 Cervicalgia: Secondary | ICD-10-CM | POA: Diagnosis not present

## 2021-02-11 DIAGNOSIS — M5416 Radiculopathy, lumbar region: Secondary | ICD-10-CM | POA: Diagnosis not present

## 2021-02-11 MED ORDER — CELECOXIB 200 MG PO CAPS
ORAL_CAPSULE | ORAL | 1 refills | Status: AC
  Filled 2021-02-11: qty 90, 90d supply, fill #0

## 2021-02-18 DIAGNOSIS — S71111D Laceration without foreign body, right thigh, subsequent encounter: Secondary | ICD-10-CM | POA: Diagnosis not present

## 2021-02-18 DIAGNOSIS — Z4802 Encounter for removal of sutures: Secondary | ICD-10-CM | POA: Diagnosis not present

## 2021-02-23 ENCOUNTER — Other Ambulatory Visit (HOSPITAL_COMMUNITY): Payer: Self-pay | Admitting: *Deleted

## 2021-02-23 DIAGNOSIS — D61818 Other pancytopenia: Secondary | ICD-10-CM

## 2021-02-23 DIAGNOSIS — D696 Thrombocytopenia, unspecified: Secondary | ICD-10-CM

## 2021-02-23 DIAGNOSIS — R161 Splenomegaly, not elsewhere classified: Secondary | ICD-10-CM

## 2021-04-03 DIAGNOSIS — J069 Acute upper respiratory infection, unspecified: Secondary | ICD-10-CM | POA: Diagnosis not present

## 2021-05-05 DIAGNOSIS — J069 Acute upper respiratory infection, unspecified: Secondary | ICD-10-CM | POA: Diagnosis not present

## 2021-05-05 DIAGNOSIS — M545 Low back pain, unspecified: Secondary | ICD-10-CM | POA: Diagnosis not present

## 2021-06-02 ENCOUNTER — Other Ambulatory Visit (HOSPITAL_COMMUNITY): Payer: Self-pay | Admitting: Internal Medicine

## 2021-06-02 ENCOUNTER — Ambulatory Visit (HOSPITAL_COMMUNITY)
Admission: RE | Admit: 2021-06-02 | Discharge: 2021-06-02 | Disposition: A | Discharge status: Home / Self Care | Payer: 59 | Source: Ambulatory Visit | Attending: Internal Medicine | Admitting: Internal Medicine

## 2021-06-02 DIAGNOSIS — M546 Pain in thoracic spine: Secondary | ICD-10-CM | POA: Diagnosis not present

## 2021-06-05 ENCOUNTER — Other Ambulatory Visit (HOSPITAL_COMMUNITY): Payer: Self-pay

## 2021-06-05 DIAGNOSIS — F339 Major depressive disorder, recurrent, unspecified: Secondary | ICD-10-CM | POA: Diagnosis not present

## 2021-06-05 DIAGNOSIS — J302 Other seasonal allergic rhinitis: Secondary | ICD-10-CM | POA: Diagnosis not present

## 2021-06-05 DIAGNOSIS — M545 Low back pain, unspecified: Secondary | ICD-10-CM | POA: Diagnosis not present

## 2021-06-05 MED ORDER — RYALTRIS 665-25 MCG/ACT NA SUSP
NASAL | 5 refills | Status: AC
  Filled 2021-06-05: qty 29, 30d supply, fill #0

## 2021-06-05 MED ORDER — DESVENLAFAXINE SUCCINATE ER 50 MG PO TB24
50.0000 mg | ORAL_TABLET | Freq: Every day | ORAL | 1 refills | Status: AC
  Filled 2021-06-05: qty 90, 90d supply, fill #0

## 2021-06-05 MED ORDER — MONTELUKAST SODIUM 10 MG PO TABS
10.0000 mg | ORAL_TABLET | Freq: Every evening | ORAL | 1 refills | Status: AC
  Filled 2021-06-05: qty 90, 90d supply, fill #0

## 2021-06-05 MED ORDER — BUPROPION HCL ER (XL) 150 MG PO TB24
150.0000 mg | ORAL_TABLET | Freq: Every day | ORAL | 1 refills | Status: AC
  Filled 2021-06-05: qty 90, 90d supply, fill #0

## 2021-06-08 ENCOUNTER — Other Ambulatory Visit (HOSPITAL_COMMUNITY): Payer: Self-pay

## 2021-06-09 ENCOUNTER — Other Ambulatory Visit (HOSPITAL_COMMUNITY): Payer: Self-pay

## 2021-07-20 DIAGNOSIS — E782 Mixed hyperlipidemia: Secondary | ICD-10-CM | POA: Diagnosis not present

## 2021-07-20 DIAGNOSIS — J039 Acute tonsillitis, unspecified: Secondary | ICD-10-CM | POA: Diagnosis not present

## 2021-08-07 DIAGNOSIS — R899 Unspecified abnormal finding in specimens from other organs, systems and tissues: Secondary | ICD-10-CM | POA: Diagnosis not present

## 2021-08-12 DIAGNOSIS — D709 Neutropenia, unspecified: Secondary | ICD-10-CM | POA: Diagnosis not present

## 2021-08-12 DIAGNOSIS — F5104 Psychophysiologic insomnia: Secondary | ICD-10-CM | POA: Diagnosis not present

## 2021-08-12 DIAGNOSIS — M545 Low back pain, unspecified: Secondary | ICD-10-CM | POA: Diagnosis not present

## 2021-08-12 DIAGNOSIS — J302 Other seasonal allergic rhinitis: Secondary | ICD-10-CM | POA: Diagnosis not present

## 2021-08-12 DIAGNOSIS — D6949 Other primary thrombocytopenia: Secondary | ICD-10-CM | POA: Diagnosis not present

## 2021-08-12 DIAGNOSIS — E669 Obesity, unspecified: Secondary | ICD-10-CM | POA: Diagnosis not present

## 2021-08-12 DIAGNOSIS — M25572 Pain in left ankle and joints of left foot: Secondary | ICD-10-CM | POA: Diagnosis not present

## 2021-08-12 DIAGNOSIS — F32A Depression, unspecified: Secondary | ICD-10-CM | POA: Diagnosis not present

## 2021-08-12 DIAGNOSIS — M542 Cervicalgia: Secondary | ICD-10-CM | POA: Diagnosis not present

## 2021-08-25 NOTE — Progress Notes (Unsigned)
[name] Kelley, [place] [address]   CLINIC:  Medical Oncology/Hematology  PCP:  Celene Squibb, MD [place] [address] [phone]   REASON FOR VISIT:  Follow-up for leukopenia and thrombocytopenia in the setting of massive splenomegaly  CURRENT THERAPY: Surveillance  INTERVAL HISTORY:  [name] Kelley 36 y.o. male returns for routine follow-up of his leukopenia, thrombocytopenia, and splenomegaly.  He was last seen by NP Wenda Low on 08/13/2019, but was lost to follow-up.  He returns to reestablish care after 2 years after being referred back to [place] by his primary care provider (Dr. Nevada Crane).  Patient reports that over the past year he has had progressive fatigue and feels "completely worn out after simple chores."  He also reports intermittent fevers for the past year, ranging from "low-grade fevers" at 99.5 F up to 100.3 F.  He reports that his temperature will often be low (94 F) the morning after he has had a fever.  He reports possible cyclical pattern to fevers, which have been occurring every 2 to 3 weeks on average.  He has intermittent lymphadenopathy of his right neck, which is tender.  He reports dry cough for the past several months.  He reports frequent infections (viral colds and upper respiratory infections), and has required antibiotics 3 times since January 2023.  (Reports that previously described fevers are unrelated to these infections.)  Over the past year, he has also developed hot flashes and drenching night sweats.  He reports diffuse myalgias.  He admits to easy bruising but denies any petechial rash or abnormal bleeding.  He continues to experience decreased appetite, early satiety, and left upper quadrant abdominal soreness. He reports 40% energy and 50% appetite.  He reports that he has been maintaining a stable weight and denies any unintentional weight loss.   REVIEW OF SYSTEMS:  Review of Systems   Constitutional:  Positive for appetite change, diaphoresis, fatigue and fever. Negative for chills and unexpected weight change.  HENT:   Positive for lump/mass (Intermittent right cervical lymphadenopathy) and trouble swallowing (Occasionally). Negative for nosebleeds.   Eyes:  Negative for eye problems.  Respiratory:  Positive for cough (Dry) and shortness of breath. Negative for hemoptysis.   Cardiovascular:  Negative for chest pain, leg swelling and palpitations.  Gastrointestinal:  Positive for nausea. Negative for abdominal pain, blood in stool, constipation, diarrhea and vomiting.  Genitourinary:  Negative for hematuria.   Skin: Negative.   Neurological:  Positive for dizziness, headaches and numbness. Negative for light-headedness.  Hematological:  Does not bruise/bleed easily.  Psychiatric/Behavioral:  Positive for depression. The patient is nervous/anxious.      PAST MEDICAL/SURGICAL HISTORY:  Past Medical History:  Diagnosis Date   Medical history non-contributory    Past Surgical History:  Procedure Laterality Date   Arm Manipulation Right 1995?   INCISION AND DRAINAGE ABSCESS N/A 01/05/2013   Procedure: INCISION AND DRAINAGE PERIANAL  ABSCESS;  Surgeon: Jamesetta So, MD;  Location: [facility];  Service: General;  Laterality: N/A;   ROTATOR CUFF REPAIR     SHOULDER ARTHROSCOPY WITH OPEN ROTATOR CUFF REPAIR AND DISTAL CLAVICLE ACROMINECTOMY Left 11/02/2016   Procedure: LEFT SHOULDER ARTHROSCOPY WITH MINI OPEN ROTATOR CUFF REPAIR, OPEN BICEPS TENODESIS, AND DEBRIDEMENT;  Surgeon: Meredith Pel, MD;  Location: [facility];  Service: Orthopedics;  Laterality: Left;   SHOULDER ARTHROSCOPY WITH SUBACROMIAL DECOMPRESSION, ROTATOR CUFF REPAIR AND BICEP TENDON REPAIR Right 12/09/2015   Procedure: SHOULDER  DIAGNOSTIC OPERATIVE ARTHROSCOPY, SUBACROMIAL DECOMPRESSION, DISTAL CLAVICLE EXCISION, MINI-OPEN ROTATOR CUFF REPAIR AND BICEP TENODESIS.;  Surgeon: Meredith Pel, MD;  Location:  [facility];  Service: Orthopedics;  Laterality: Right;   widsom teeth  2017     SOCIAL HISTORY:  Social History   Socioeconomic History   Marital status: Married    Spouse name: Not on file   Number of children: Not on file   Years of education: Not on file   Highest education level: Not on file  Occupational History   Not on file  Tobacco Use   Smoking status: Never   Smokeless tobacco: Never  Vaping Use   Vaping Use: Never used  Substance and Sexual Activity   Alcohol use: No   Drug use: No   Sexual activity: Not on file  Other Topics Concern   Not on file  Social History Narrative   Not on file   Social Determinants of Health   Financial Resource Strain: Not on file  Food Insecurity: Not on file  Transportation Needs: Not on file  Physical Activity: Not on file  Stress: Not on file  Social Connections: Not on file  Intimate Partner Violence: Not on file    FAMILY HISTORY:  Family History  Problem Relation Age of Onset   Prostate cancer Father     CURRENT MEDICATIONS:  Outpatient Encounter Medications as of 08/26/2021  Medication Sig   acetaminophen (TYLENOL) 500 MG tablet Take 2 tablets (1,000 mg total) by mouth every 6 (six) hours.   buPROPion (WELLBUTRIN XL) 150 MG 24 hr tablet Take 150 mg by mouth daily.   buPROPion (WELLBUTRIN XL) 150 MG 24 hr tablet TAKE 1 TABLET BY MOUTH DAILY   buPROPion (WELLBUTRIN XL) 150 MG 24 hr tablet TAKE 1 TABLET BY MOUTH DAILY   celecoxib (CELEBREX) 200 MG capsule Take 200 mg by mouth daily.   celecoxib (CELEBREX) 200 MG capsule Take 1 capsule by mouth daily   desvenlafaxine (PRISTIQ) 50 MG 24 hr tablet TAKE 1 TABLET BY MOUTH DAILY   desvenlafaxine (PRISTIQ) 50 MG 24 hr tablet Take 1 tablet (50 mg total) by mouth daily.   Desvenlafaxine Succinate ER 25 MG TB24 Take 1 tablet by mouth daily.   methocarbamol (ROBAXIN) 500 MG tablet Take 1 tablet (500 mg total) by mouth every 6 (six) hours as needed for muscle spasms.    methocarbamol (ROBAXIN) 500 MG tablet Take 1 tablet (500 mg total) by mouth every 8 (eight) hours as needed for muscle spasms.   montelukast (SINGULAIR) 10 MG tablet Take 1 tablet (10 mg total) by mouth at bedtime.   Olopatadine-Mometasone (RYALTRIS) [id number] MCG/ACT SUSP Use 2 sprays in each nostril twice a day   predniSONE (STERAPRED UNI-PAK 21 TAB) 5 MG (21) TBPK tablet Take dosepak as directed   No facility-administered encounter medications on file as of 08/26/2021.    ALLERGIES:  Allergies  Allergen Reactions   Hydrocodone Swelling    Tongue and lips swelling     PHYSICAL EXAM:  ECOG PERFORMANCE STATUS: 1 - Symptomatic but completely ambulatory  There were no vitals filed for this visit. There were no vitals filed for this visit. Physical Exam Constitutional:      Appearance: Normal appearance.  HENT:     Head: Normocephalic and atraumatic.     Mouth/Throat:     Mouth: Mucous membranes are moist.  Eyes:     Extraocular Movements: Extraocular movements intact.     Pupils: Pupils are equal, round,  and reactive to light.  Cardiovascular:     Rate and Rhythm: Normal rate and regular rhythm.     Pulses: Normal pulses.     Heart sounds: Normal heart sounds.  Pulmonary:     Effort: Pulmonary effort is normal.     Breath sounds: Normal breath sounds.  Abdominal:     General: Bowel sounds are normal.     Palpations: Abdomen is soft. There is no splenomegaly.     Tenderness: There is no abdominal tenderness.  Musculoskeletal:        General: No swelling.     Right lower leg: No edema.     Left lower leg: No edema.  Lymphadenopathy:     Cervical: No cervical adenopathy.  Skin:    General: Skin is warm and dry.  Neurological:     General: No focal deficit present.     Mental Status: He is alert and oriented to person, place, and time.  Psychiatric:        Mood and Affect: Mood normal.        Behavior: Behavior normal.      LABORATORY DATA:  I have reviewed the labs  as listed.  CBC    Component Value Date/Time   WBC 1.7 (L) 11/27/2019 0137   RBC 4.51 11/27/2019 0137   HGB 13.2 11/27/2019 0137   HCT 39.4 11/27/2019 0137   PLT 71 (L) 11/27/2019 0137   MCV 87.4 11/27/2019 0137   MCH 29.3 11/27/2019 0137   MCHC 33.5 11/27/2019 0137   RDW 13.2 11/27/2019 0137   LYMPHSABS 0.5 (L) 11/26/2019 1609   MONOABS 0.3 11/26/2019 1609   EOSABS 0.1 11/26/2019 1609   BASOSABS 0.0 11/26/2019 1609      Latest Ref Rng & Units 11/27/2019    1:37 AM 11/26/2019    4:09 PM 03/05/2019    9:08 AM  CMP  Glucose 70 - 99 mg/dL 96  105  100   BUN 6 - 20 mg/dL $Remove'14  17  18   'CWQZZSh$ Creatinine 0.61 - 1.24 mg/dL 1.17  1.12  1.09   Sodium 135 - 145 mmol/L 137  134  137   Potassium 3.5 - 5.1 mmol/L 3.8  4.2  4.6   Chloride 98 - 111 mmol/L 105  103  107   CO2 22 - 32 mmol/L $RemoveB'25  24  24   'tGHATSpW$ Calcium 8.9 - 10.3 mg/dL 8.9  8.8  9.1   Total Protein 6.5 - 8.1 g/dL  7.0  7.3   Total Bilirubin 0.3 - 1.2 mg/dL  1.0  1.1   Alkaline Phos 38 - 126 U/L  60  65   AST 15 - 41 U/L  22  23   ALT 0 - 44 U/L  29  30     DIAGNOSTIC IMAGING:  I have independently reviewed the relevant imaging and discussed with the patient.  ASSESSMENT & PLAN: 1.  Leukopenia & thrombocytopenia, secondary to splenomegaly - Has been followed by Hematology/Oncology clinic since June 2020 - Reportedly told that he had a "large spleen" around age 83 or 55, evaluated at [facility] in the mid 1990s with ultrasound, possible blood tests.  Denies any family history of metabolic storage disorder. - He did not have any interval imaging or work-up from the mid 1990s to 2020. - He has had thrombocytopenia since 2017. - Leukopenia since 2020. - Extensive previous work-up: Nutrition and connective tissue disorder work-up was negative. Bone marrow biopsy (07/19/2018) showed normocellular  marrow for age with trilineage hematopoiesis and progressive maturation.  Chromosome analysis was normal. Peripheral blood flow  cytometry was normal. Negative HIV testing in October 2021.  Negative for hepatitis A, hepatitis B, and hepatitis C in June 2020. Negative for Lyme disease and [facility] spotted fever in June 2020 Leukemia/lymphoma panel obtained in June 2020 was unremarkable EBV panel from June 2020 showed increased VCA IgM, VCA IgG, and EBNA IgG, which was indicative of possible recent infection. CT abdomen/pelvis (03/07/2019) showed marked splenomegaly measuring 24 cm in craniocaudal direction.  Mild mesenteric lymphadenopathy with the largest lymph node measuring 1.6 cm and no other lymphadenopathy. CT abdomen/pelvis (11/26/2019): Stable splenomegaly with multiple enlarged mesenteric lymph nodes measuring up to 1.4 cm in size with several.  Similar to slightly decreased from comparison CT in January 2021 but without abnormal FDG avidity on PET/CT.  Finding remains nonspecific. PET scan (04/02/2019) showed splenomegaly with no focal spleen lesions or abnormal hypermetabolism.  No suspicious lymphadenopathy.  Small jejunal mesentery lymph nodes are non-FDG avid.  - Symptomatic splenomegaly with decreased appetite, left upper quadrant abdominal soreness, and early satiety - Over the past year, patient has had onset of various symptoms including unexplained fevers, hot flashes, night sweats, severe fatigue.  Intermittent lymphadenopathy of right neck.  Dry cough.  Frequent infections.  Diffuse body aches. - Most recent CBC (obtained by PCP on 08/07/2021) shows WBC 1.7, ANC 0.9, ALC 0.5, platelets 66.  Blood counts essentially stable over the past 2 to 3 years. - PLAN: Unclear etiology of splenomegaly at this time.  Due to progressive symptoms over the past year, we will proceed with the following work-up (discussed extensively with Dr. Delton Coombes, who agrees with the below plan): We will see if we can obtain records from [facility] from patient's work-up in the mid 1990s. We will check quantitative  immunoglobulins due to frequent infections. We will check testosterone levels due to fatigue, night sweats, hot flashes. We will check CMV and tuberculosis due to B symptoms, splenomegaly, and cough. We will repeat CBC/D, LDH, and CMP. We will check PET scan for initial treatment strategy of splenomegaly, pancytopenia, and B symptoms. - Patient instructed to log his fevers and other symptoms over the next several months and to bring this information to his next appointment. - RTC in approximately 1 month, after PET scan and labs have been obtained. - If the above work-up is nonrevealing, could also consider work-up for glycogen-storage disease and/or referral to infectious disease.   All questions were answered. The patient knows to call the clinic with any problems, questions or concerns.  Medical decision making: Moderate  Time spent on visit: I spent 40 minutes counseling the patient face to face. The total time spent in the appointment was 60 minutes and more than 50% was on counseling.   Harriett Rush, PA-C  08/26/2021 11:56 PM

## 2021-08-26 ENCOUNTER — Inpatient Hospital Stay (HOSPITAL_COMMUNITY): Payer: 59 | Attending: Physician Assistant | Admitting: Physician Assistant

## 2021-08-26 ENCOUNTER — Encounter (HOSPITAL_COMMUNITY): Payer: Self-pay | Admitting: Physician Assistant

## 2021-08-26 ENCOUNTER — Ambulatory Visit (HOSPITAL_COMMUNITY)
Admission: RE | Admit: 2021-08-26 | Discharge: 2021-08-26 | Disposition: A | Discharge status: Home / Self Care | Payer: 59 | Source: Ambulatory Visit | Attending: Physician Assistant | Admitting: Physician Assistant

## 2021-08-26 VITALS — BP 119/80 | HR 78 | Temp 97.8°F | Resp 18 | Ht 73.0 in | Wt 263.3 lb

## 2021-08-26 DIAGNOSIS — R052 Subacute cough: Secondary | ICD-10-CM

## 2021-08-26 DIAGNOSIS — D696 Thrombocytopenia, unspecified: Secondary | ICD-10-CM

## 2021-08-26 DIAGNOSIS — D61818 Other pancytopenia: Secondary | ICD-10-CM | POA: Diagnosis not present

## 2021-08-26 DIAGNOSIS — D72819 Decreased white blood cell count, unspecified: Secondary | ICD-10-CM | POA: Diagnosis not present

## 2021-08-26 DIAGNOSIS — R6881 Early satiety: Secondary | ICD-10-CM | POA: Diagnosis not present

## 2021-08-26 DIAGNOSIS — Z8042 Family history of malignant neoplasm of prostate: Secondary | ICD-10-CM | POA: Insufficient documentation

## 2021-08-26 DIAGNOSIS — R059 Cough, unspecified: Secondary | ICD-10-CM | POA: Diagnosis not present

## 2021-08-26 DIAGNOSIS — R161 Splenomegaly, not elsewhere classified: Secondary | ICD-10-CM

## 2021-08-26 DIAGNOSIS — R509 Fever, unspecified: Secondary | ICD-10-CM

## 2021-08-27 ENCOUNTER — Inpatient Hospital Stay (HOSPITAL_COMMUNITY): Payer: 59

## 2021-08-27 DIAGNOSIS — R6881 Early satiety: Secondary | ICD-10-CM | POA: Diagnosis not present

## 2021-08-27 DIAGNOSIS — D696 Thrombocytopenia, unspecified: Secondary | ICD-10-CM | POA: Diagnosis not present

## 2021-08-27 DIAGNOSIS — Z8042 Family history of malignant neoplasm of prostate: Secondary | ICD-10-CM | POA: Diagnosis not present

## 2021-08-27 DIAGNOSIS — R509 Fever, unspecified: Secondary | ICD-10-CM | POA: Diagnosis not present

## 2021-08-27 DIAGNOSIS — R161 Splenomegaly, not elsewhere classified: Secondary | ICD-10-CM | POA: Diagnosis not present

## 2021-08-27 DIAGNOSIS — D72819 Decreased white blood cell count, unspecified: Secondary | ICD-10-CM | POA: Diagnosis not present

## 2021-08-27 DIAGNOSIS — R052 Subacute cough: Secondary | ICD-10-CM | POA: Diagnosis not present

## 2021-08-27 DIAGNOSIS — D61818 Other pancytopenia: Secondary | ICD-10-CM

## 2021-08-27 LAB — COMPREHENSIVE METABOLIC PANEL
ALT: 40 U/L (ref 0–44)
AST: 26 U/L (ref 15–41)
Albumin: 4.5 g/dL (ref 3.5–5.0)
Alkaline Phosphatase: 71 U/L (ref 38–126)
Anion gap: 5 (ref 5–15)
BUN: 17 mg/dL (ref 6–20)
CO2: 26 mmol/L (ref 22–32)
Calcium: 8.5 mg/dL — ABNORMAL LOW (ref 8.9–10.3)
Chloride: 105 mmol/L (ref 98–111)
Creatinine, Ser: 1.22 mg/dL (ref 0.61–1.24)
GFR, Estimated: >60 mL/min (ref >60)
Glucose, Bld: 92 mg/dL (ref 70–99)
Potassium: 4 mmol/L (ref 3.5–5.1)
Sodium: 136 mmol/L (ref 135–145)
Total Bilirubin: 1 mg/dL (ref 0.3–1.2)
Total Protein: 7.3 g/dL (ref 6.5–8.1)

## 2021-08-27 LAB — CBC WITH DIFFERENTIAL/PLATELET
Abs Immature Granulocytes: 0.01 10*3/uL (ref 0.00–0.07)
Basophils Absolute: 0 10*3/uL (ref 0.0–0.1)
Basophils Relative: 1 %
Eosinophils Absolute: 0.1 10*3/uL (ref 0.0–0.5)
Eosinophils Relative: 3 %
HCT: 41.8 % (ref 39.0–52.0)
Hemoglobin: 14.2 g/dL (ref 13.0–17.0)
Immature Granulocytes: 1 %
Lymphocytes Relative: 33 %
Lymphs Abs: 0.7 10*3/uL (ref 0.7–4.0)
MCH: 28.9 pg (ref 26.0–34.0)
MCHC: 34 g/dL (ref 30.0–36.0)
MCV: 85.1 fL (ref 80.0–100.0)
Monocytes Absolute: 0.2 10*3/uL (ref 0.1–1.0)
Monocytes Relative: 10 %
Neutro Abs: 1.1 10*3/uL — ABNORMAL LOW (ref 1.7–7.7)
Neutrophils Relative %: 52 %
Platelets: 79 10*3/uL — ABNORMAL LOW (ref 150–400)
RBC: 4.91 MIL/uL (ref 4.22–5.81)
RDW: 13.8 % (ref 11.5–15.5)
WBC: 2.1 10*3/uL — ABNORMAL LOW (ref 4.0–10.5)
nRBC: 0 % (ref 0.0–0.2)

## 2021-08-27 LAB — IRON AND TIBC
Iron: 62 ug/dL (ref 45–182)
Saturation Ratios: 20 % (ref 17.9–39.5)
TIBC: 308 ug/dL (ref 250–450)
UIBC: 246 ug/dL

## 2021-08-27 LAB — FERRITIN: Ferritin: 108 ng/mL (ref 24–336)

## 2021-08-27 LAB — LACTATE DEHYDROGENASE: LDH: 116 U/L (ref 98–192)

## 2021-08-28 LAB — IGG, IGA, IGM
IgA: 114 mg/dL (ref 90–386)
IgG (Immunoglobin G), Serum: 1230 mg/dL (ref 603–1613)
IgM (Immunoglobulin M), Srm: 121 mg/dL (ref 20–172)

## 2021-08-29 LAB — TESTOSTERONE: Testosterone: 481 ng/dL (ref 264–916)

## 2021-09-01 LAB — QUANTIFERON-TB GOLD PLUS (RQFGPL)
QuantiFERON Mitogen Value: >10 IU/mL
QuantiFERON Nil Value: 0.46 IU/mL
QuantiFERON TB1 Ag Value: 0.28 IU/mL
QuantiFERON TB2 Ag Value: 0.4 IU/mL

## 2021-09-01 LAB — CMV DNA, QUANTITATIVE, PCR
CMV DNA Quant: NEGATIVE IU/mL
Log10 CMV Qn DNA Pl: UNDETERMINED log10 IU/mL

## 2021-09-01 LAB — QUANTIFERON-TB GOLD PLUS: QuantiFERON-TB Gold Plus: NEGATIVE

## 2021-09-02 LAB — TESTOSTERONE, % FREE: Testosterone-% Free: 0.8 % — ABNORMAL HIGH (ref 0.2–0.7)

## 2021-09-09 ENCOUNTER — Encounter (HOSPITAL_COMMUNITY): Payer: Self-pay | Admitting: *Deleted

## 2021-09-09 ENCOUNTER — Other Ambulatory Visit (HOSPITAL_COMMUNITY): Payer: Self-pay | Admitting: Physician Assistant

## 2021-09-09 DIAGNOSIS — R161 Splenomegaly, not elsewhere classified: Secondary | ICD-10-CM

## 2021-09-09 DIAGNOSIS — D61818 Other pancytopenia: Secondary | ICD-10-CM

## 2021-09-09 NOTE — Progress Notes (Signed)
Patient is scheduled for PET scan this week, but he was informed that the co-pay would be about $2800.  He is requesting alternative testing if possible.  Per discussion with Dr. Ellin Saba, we will check CT abdomen/pelvis to reevaluate splenomegaly, but still may need PET scan again in the future.

## 2021-09-10 ENCOUNTER — Encounter (HOSPITAL_COMMUNITY): Payer: 59

## 2021-09-14 ENCOUNTER — Encounter (HOSPITAL_COMMUNITY): Payer: Self-pay | Admitting: *Deleted

## 2021-09-15 NOTE — Progress Notes (Deleted)
[place], [place] [address]   CLINIC:  Medical Oncology/Hematology  PCP:  Celene Squibb, MD [place] [address] [phone]   REASON FOR VISIT:  Follow-up for leukopenia and thrombocytopenia in the setting of massive splenomegaly  CURRENT THERAPY: Surveillance  INTERVAL HISTORY:  [name] Kelley 36 y.o. male returns for routine follow-up of his leukopenia, thrombocytopenia, and splenomegaly.  He was last seen by Tarri Abernethy PA-C on 08/26/2021.  Patient continues to report significant fatigue and that he feels "completely worn out after simple chores."  *** *** He also reports intermittent fevers for the past year, ranging from "low-grade fevers" at 99.5 F up to 100.3 F.  He reports that his temperature will often be low (94 F) the morning after he has had a fever.  He reports possible cyclical pattern to fevers, which have been occurring every 2 to 3 weeks on average.  *** *** He has intermittent lymphadenopathy of his right neck, which is tender.  *** *** He reports dry cough for the past several months.  *** *** He reports frequent infections (viral colds and upper respiratory infections), and has required antibiotics 3 times since January 2023.  (Reports that previously described fevers are unrelated to these infections.)  *** *** Over the past year, he has also developed hot flashes and drenching night sweats.  He reports diffuse myalgias.  *** *** He admits to easy bruising but denies any petechial rash or abnormal bleeding.  *** *** He continues to experience decreased appetite, early satiety, and left upper quadrant abdominal soreness.  *** *** He reports  *** % energy and  *** % appetite.  He reports that he has been maintaining a stable weight and denies any unintentional weight loss.   REVIEW OF SYSTEMS:  ***  Review of Systems  Constitutional:  Positive for appetite change, diaphoresis, fatigue and fever.  Negative for chills and unexpected weight change.  HENT:   Positive for lump/mass (Intermittent right cervical lymphadenopathy) and trouble swallowing (Occasionally). Negative for nosebleeds.   Eyes:  Negative for eye problems.  Respiratory:  Positive for cough (Dry) and shortness of breath. Negative for hemoptysis.   Cardiovascular:  Negative for chest pain, leg swelling and palpitations.  Gastrointestinal:  Positive for nausea. Negative for abdominal pain, blood in stool, constipation, diarrhea and vomiting.  Genitourinary:  Negative for hematuria.   Skin: Negative.   Neurological:  Positive for dizziness, headaches and numbness. Negative for light-headedness.  Hematological:  Does not bruise/bleed easily.  Psychiatric/Behavioral:  Positive for depression. The patient is nervous/anxious.      PAST MEDICAL/SURGICAL HISTORY:  Past Medical History:  Diagnosis Date   Medical history non-contributory    Past Surgical History:  Procedure Laterality Date   Arm Manipulation Right 1995?   INCISION AND DRAINAGE ABSCESS N/A 01/05/2013   Procedure: INCISION AND DRAINAGE PERIANAL  ABSCESS;  Surgeon: Jamesetta So, MD;  Location: [facility];  Service: General;  Laterality: N/A;   ROTATOR CUFF REPAIR     SHOULDER ARTHROSCOPY WITH OPEN ROTATOR CUFF REPAIR AND DISTAL CLAVICLE ACROMINECTOMY Left 11/02/2016   Procedure: LEFT SHOULDER ARTHROSCOPY WITH MINI OPEN ROTATOR CUFF REPAIR, OPEN BICEPS TENODESIS, AND DEBRIDEMENT;  Surgeon: Meredith Pel, MD;  Location: [facility];  Service: Orthopedics;  Laterality: Left;   SHOULDER ARTHROSCOPY WITH SUBACROMIAL DECOMPRESSION, ROTATOR CUFF REPAIR AND BICEP TENDON REPAIR Right 12/09/2015   Procedure: SHOULDER DIAGNOSTIC OPERATIVE ARTHROSCOPY, SUBACROMIAL DECOMPRESSION, DISTAL CLAVICLE EXCISION,  MINI-OPEN ROTATOR CUFF REPAIR AND BICEP TENODESIS.;  Surgeon: Meredith Pel, MD;  Location: [facility];  Service: Orthopedics;  Laterality: Right;   widsom teeth  2017      SOCIAL HISTORY:  Social History   Socioeconomic History   Marital status: Married    Spouse name: Not on file   Number of children: Not on file   Years of education: Not on file   Highest education level: Not on file  Occupational History   Not on file  Tobacco Use   Smoking status: Never   Smokeless tobacco: Never  Vaping Use   Vaping Use: Never used  Substance and Sexual Activity   Alcohol use: No   Drug use: No   Sexual activity: Not on file  Other Topics Concern   Not on file  Social History Narrative   Not on file   Social Determinants of Health   Financial Resource Strain: Not on file  Food Insecurity: Not on file  Transportation Needs: Not on file  Physical Activity: Not on file  Stress: Not on file  Social Connections: Not on file  Intimate Partner Violence: Not on file    FAMILY HISTORY:  Family History  Problem Relation Age of Onset   Prostate cancer Father     CURRENT MEDICATIONS:  Outpatient Encounter Medications as of 09/16/2021  Medication Sig   acetaminophen (TYLENOL) 500 MG tablet Take 2 tablets (1,000 mg total) by mouth every 6 (six) hours.   buPROPion (WELLBUTRIN XL) 150 MG 24 hr tablet Take 150 mg by mouth daily.   buPROPion (WELLBUTRIN XL) 150 MG 24 hr tablet TAKE 1 TABLET BY MOUTH DAILY   celecoxib (CELEBREX) 200 MG capsule Take 200 mg by mouth daily.   celecoxib (CELEBREX) 200 MG capsule Take 1 capsule by mouth daily   desvenlafaxine (PRISTIQ) 50 MG 24 hr tablet Take 1 tablet (50 mg total) by mouth daily.   Desvenlafaxine Succinate ER 25 MG TB24 Take 1 tablet by mouth daily.   methocarbamol (ROBAXIN) 500 MG tablet Take 1 tablet (500 mg total) by mouth every 6 (six) hours as needed for muscle spasms.   methocarbamol (ROBAXIN) 500 MG tablet Take 1 tablet (500 mg total) by mouth every 8 (eight) hours as needed for muscle spasms.   montelukast (SINGULAIR) 10 MG tablet Take 1 tablet (10 mg total) by mouth at bedtime.    Olopatadine-Mometasone (RYALTRIS) [id number] MCG/ACT SUSP Use 2 sprays in each nostril twice a day   oxyCODONE (OXY IR/ROXICODONE) 5 MG immediate release tablet Take 5 mg by mouth every 6 (six) hours as needed.   Promethazine HCl 6.25 MG/5ML SOLN Take 5 mLs by mouth at bedtime.   zolpidem (AMBIEN) 10 MG tablet Take 10 mg by mouth daily as needed.   No facility-administered encounter medications on file as of 09/16/2021.    ALLERGIES:  Allergies  Allergen Reactions   Hydrocodone Swelling    Tongue and lips swelling     PHYSICAL EXAM:  ***  ECOG PERFORMANCE STATUS: 1 - Symptomatic but completely ambulatory  There were no vitals filed for this visit. There were no vitals filed for this visit. Physical Exam Constitutional:      Appearance: Normal appearance.  HENT:     Head: Normocephalic and atraumatic.     Mouth/Throat:     Mouth: Mucous membranes are moist.  Eyes:     Extraocular Movements: Extraocular movements intact.     Pupils: Pupils are equal, round, and reactive to  light.  Cardiovascular:     Rate and Rhythm: Normal rate and regular rhythm.     Pulses: Normal pulses.     Heart sounds: Normal heart sounds.  Pulmonary:     Effort: Pulmonary effort is normal.     Breath sounds: Normal breath sounds.  Abdominal:     General: Bowel sounds are normal.     Palpations: Abdomen is soft. There is no splenomegaly.     Tenderness: There is no abdominal tenderness.  Musculoskeletal:        General: No swelling.     Right lower leg: No edema.     Left lower leg: No edema.  Lymphadenopathy:     Cervical: No cervical adenopathy.  Skin:    General: Skin is warm and dry.  Neurological:     General: No focal deficit present.     Mental Status: He is alert and oriented to person, place, and time.  Psychiatric:        Mood and Affect: Mood normal.        Behavior: Behavior normal.    LABORATORY DATA:  I have reviewed the labs as listed.  CBC    Component Value Date/Time    WBC 2.1 (L) 08/27/2021 1542   RBC 4.91 08/27/2021 1542   HGB 14.2 08/27/2021 1542   HCT 41.8 08/27/2021 1542   PLT 79 (L) 08/27/2021 1542   MCV 85.1 08/27/2021 1542   MCH 28.9 08/27/2021 1542   MCHC 34.0 08/27/2021 1542   RDW 13.8 08/27/2021 1542   LYMPHSABS 0.7 08/27/2021 1542   MONOABS 0.2 08/27/2021 1542   EOSABS 0.1 08/27/2021 1542   BASOSABS 0.0 08/27/2021 1542      Latest Ref Rng & Units 08/27/2021    4:05 PM 11/27/2019    1:37 AM 11/26/2019    4:09 PM  CMP  Glucose 70 - 99 mg/dL 92  96  105   BUN 6 - 20 mg/dL _0 Creatinine 0.61 - 1.24 mg/dL 1.22  1.17  1.12   Sodium 135 - 145 mmol/L 136  137  134   Potassium 3.5 - 5.1 mmol/L 4.0  3.8  4.2   Chloride 98 - 111 mmol/L 105  105  103   CO2 22 - 32 mmol/L _1 Calcium 8.9 - 10.3 mg/dL 8.5  8.9  8.8   Total Protein 6.5 - 8.1 g/dL 7.3   7.0   Total Bilirubin 0.3 - 1.2 mg/dL 1.0   1.0   Alkaline Phos 38 - 126 U/L 71   60   AST 15 - 41 U/L 26   22   ALT 0 - 44 U/L 40   29     DIAGNOSTIC IMAGING:  I have independently reviewed the relevant imaging and discussed with the patient.  ASSESSMENT & PLAN: 1.  Leukopenia & thrombocytopenia, secondary to splenomegaly - Has been followed by Hematology/Oncology clinic since June 2020 - Reportedly told that he had a "large spleen" around age 44 or 60, evaluated at [facility] in the mid 1990s with ultrasound, possible blood tests.  Denies any family history of metabolic storage disorder. - He did not have any interval imaging or work-up from the mid 1990s to 2020. - He has had thrombocytopenia since 2017. - Leukopenia since 2020. - Extensive previous work-up: Nutrition and connective tissue disorder work-up was negative.  (ANA and RF negative in June 2020) Bone marrow biopsy (07/19/2018)  showed normocellular marrow for age with trilineage hematopoiesis and progressive maturation.  Chromosome analysis was normal. Peripheral blood flow cytometry was  normal. Negative HIV testing in October 2021.  Negative for hepatitis A, hepatitis B, and hepatitis C in June 2020. Negative for Lyme disease and [facility] spotted fever in June 2020 Leukemia/lymphoma panel obtained in June 2020 was unremarkable EBV panel from June 2020 showed increased VCA IgM, VCA IgG, and EBNA IgG, which was thought to be indicative of possible recent infection. CT abdomen/pelvis (03/07/2019) showed marked splenomegaly measuring 24 cm in craniocaudal direction.  Mild mesenteric lymphadenopathy with the largest lymph node measuring 1.6 cm and no other lymphadenopathy. CT abdomen/pelvis (11/26/2019): Stable splenomegaly with multiple enlarged mesenteric lymph nodes measuring up to 1.4 cm in size with several.  Similar to slightly decreased from comparison CT in January 2021 but without abnormal FDG avidity on PET/CT.  Finding remains nonspecific. PET scan (04/02/2019) showed splenomegaly with no focal spleen lesions or abnormal hypermetabolism.  No suspicious lymphadenopathy.  Small jejunal mesentery lymph nodes are non-FDG avid.  - Symptomatic splenomegaly with decreased appetite, left upper quadrant abdominal soreness, and early satiety ***  - Over the past year, patient has had onset of various symptoms including unexplained fevers, hot flashes, night sweats, severe fatigue.  Intermittent lymphadenopathy of right neck.  Dry cough.  Frequent infections.  Diffuse body aches. *** - Most recent hematology work-up (08/27/2021): CMV and tuberculosis checked due to B symptoms with splenomegaly and cough.  Both were negative. Testosterone levels checked due to fatigue, night sweats, hot flashes.  Levels essentially normal. Quantitative immunoglobulins checked due to frequent infections.  Normal. Negative LDH 116.  CMP unremarkable. - Most recent CBC (08/27/2021) shows WBC 2.1, ANC 1.1, platelets 79.  Blood counts essentially stable over the past 2 to 3 years. - CXR (obtained 08/26/2021  due to cough) was normal. - Unable to obtain records from [facility] due to time since occurrence. - PLAN: Unclear etiology of splenomegaly at this time.  Due to progressive symptoms over the past year, we will proceed with the following work-up (discussed extensively with Dr. Delton Coombes, who agrees with the below plan): We will check PET scan for initial treatment strategy of splenomegaly, pancytopenia, and B symptoms. ***  Records from [facility]???  *** Check for lupus???  *** - ((Unexplained fever, fatigue, myalgias, lymphadenopathy)) - check ANA and anti-dsDNA, complement, and SM antibodies ((ANA with reflex)))), antiphospholipid antibodies, direct Coombs, ESR/CRP Sarcoid???  Amyloid??  Gaucher??  Immune thrombocytopenia/neutropenia causing splenomegally (instead of the other way around??)   Hemophagocytic lymphohistiocytosis ((acute or subacute febrile illness, sometimes recurrent fevers of unknown origin, splenomegaly, bicytopenia-would also need to check triglycerides, fibrinogen, ferritin, NK cell activity, and soluble CD25, PT, APTT, fibrinogen, D-dimer, soluble IL-2 receptor alpha, IL 18, CX CL 9)) - however, usually acutely ill with multiorgan involvement, liver function abnormalities, neurologic symptoms with unexplained clinical deterioration Check for glycogen-storage disease??  *** Mycobacterial infection ***??  MAC??? Chronic EBV??? "Chronic active EBV disease (CAEBV) is a lymphoproliferative disorder characterized by markedly elevated levels of antibody to EBV or EBV DNA in the blood and EBV RNA or protein in lymphocytes in tissues." --> https://ashpublications.org/blood/article/117/22/5835/21716/Characterization-and-treatment-of-chronic-active - NEED TO CHECK EBV DNA AND ANTIBODY PANEL AGAIN - Clinical manifestations may include fever, swelling of lymph nodes, and hepatosplenomegaly along with liver function test abnormalities and cytopenias - Patient instructed to log  his fevers and other symptoms over the next several months and to bring this information to his next  appointment. ***  - RTC in approximately 1 month, after PET scan and labs have been obtained. ***  - If the above work-up is nonrevealing, could also consider work-up for glycogen-storage disease and/or referral to infectious disease. ***    All questions were answered. The patient knows to call the clinic with any problems, questions or concerns.  Medical decision making: Moderate ***   Time spent on visit: I spent  ***  minutes counseling the patient face to face. The total time spent in the appointment was  ***  minutes and more than 50% was on counseling.   Harriett Rush, PA-C   ***

## 2021-09-16 ENCOUNTER — Telehealth: Payer: Self-pay | Admitting: *Deleted

## 2021-09-16 ENCOUNTER — Inpatient Hospital Stay: Payer: 59 | Attending: Physician Assistant | Admitting: Physician Assistant

## 2021-09-16 ENCOUNTER — Encounter: Payer: Self-pay | Admitting: Physician Assistant

## 2021-09-16 DIAGNOSIS — D696 Thrombocytopenia, unspecified: Secondary | ICD-10-CM | POA: Insufficient documentation

## 2021-09-16 DIAGNOSIS — D72819 Decreased white blood cell count, unspecified: Secondary | ICD-10-CM | POA: Insufficient documentation

## 2021-09-16 NOTE — Telephone Encounter (Signed)
Spoke to patient's wife regarding not being able to reach him for follow up appointment or plans.  Advised that we have attempted multiple times to contact him by phone and mailbox is full.  Made her aware that we have sent several detailed messages regarding his care via My Chart that he has not read.  Encouraged her to speak to him and make him aware that plan is laid out in a My Chart message from [facility] and we need a call back to move forward with his care.  States she will speak to him and have him reach out to [place].

## 2021-09-24 ENCOUNTER — Other Ambulatory Visit: Payer: Self-pay | Admitting: Physician Assistant

## 2021-09-24 DIAGNOSIS — D61818 Other pancytopenia: Secondary | ICD-10-CM

## 2021-09-24 DIAGNOSIS — R161 Splenomegaly, not elsewhere classified: Secondary | ICD-10-CM

## 2021-09-24 NOTE — Progress Notes (Signed)
We will check EBV antibodies and DNA to evaluate for chronic active EBV.

## 2021-09-28 ENCOUNTER — Inpatient Hospital Stay: Payer: 59

## 2021-10-06 ENCOUNTER — Inpatient Hospital Stay: Payer: 59

## 2021-10-06 ENCOUNTER — Other Ambulatory Visit: Payer: Self-pay | Admitting: *Deleted

## 2021-10-06 ENCOUNTER — Other Ambulatory Visit: Payer: Self-pay | Admitting: Physician Assistant

## 2021-10-06 ENCOUNTER — Encounter: Payer: Self-pay | Admitting: *Deleted

## 2021-10-06 DIAGNOSIS — R161 Splenomegaly, not elsewhere classified: Secondary | ICD-10-CM

## 2021-10-06 DIAGNOSIS — D61818 Other pancytopenia: Secondary | ICD-10-CM

## 2021-10-06 DIAGNOSIS — D696 Thrombocytopenia, unspecified: Secondary | ICD-10-CM | POA: Diagnosis not present

## 2021-10-06 DIAGNOSIS — D72819 Decreased white blood cell count, unspecified: Secondary | ICD-10-CM | POA: Diagnosis not present

## 2021-10-06 LAB — SEDIMENTATION RATE: Sed Rate: 5 mm/hr (ref 0–16)

## 2021-10-06 LAB — DIRECT ANTIGLOBULIN TEST (NOT AT ARMC)
DAT, IgG: NEGATIVE
DAT, complement: NEGATIVE

## 2021-10-06 LAB — C-REACTIVE PROTEIN: CRP: 0.7 mg/dL (ref <1.0)

## 2021-10-07 LAB — ANTINUCLEAR ANTIBODIES, IFA: ANA Ab, IFA: NEGATIVE

## 2021-10-07 LAB — EPSTEIN-BARR VIRUS (EBV) ANTIBODY PROFILE
EBV NA IgG: >600 U/mL — ABNORMAL HIGH (ref 0.0–17.9)
EBV VCA IgG: >600 U/mL — ABNORMAL HIGH (ref 0.0–17.9)
EBV VCA IgM: <36 U/mL (ref 0.0–35.9)

## 2021-10-07 LAB — ANTI-DNA ANTIBODY, DOUBLE-STRANDED: ds DNA Ab: 13 IU/mL — ABNORMAL HIGH (ref 0–9)

## 2021-10-07 LAB — ANTI-SMOOTH MUSCLE ANTIBODY, IGG: F-Actin IgG: 13 Units (ref 0–19)

## 2021-10-08 LAB — CARDIOLIPIN ANTIBODIES, IGG, IGM, IGA
Anticardiolipin IgA: <9 APL U/mL (ref 0–11)
Anticardiolipin IgG: <9 GPL U/mL (ref 0–14)
Anticardiolipin IgM: 11 MPL U/mL (ref 0–12)

## 2021-10-08 LAB — BETA-2-GLYCOPROTEIN I ABS, IGG/M/A
Beta-2 Glyco I IgG: <9 GPI IgG units (ref 0–20)
Beta-2-Glycoprotein I IgA: <9 GPI IgA units (ref 0–25)
Beta-2-Glycoprotein I IgM: <9 GPI IgM units (ref 0–32)

## 2021-10-08 LAB — COMPLEMENT, TOTAL: Compl, Total (CH50): >60 U/mL (ref >41)

## 2021-10-08 LAB — EPSTEIN BARR VRS(EBV DNA BY PCR): EBV DNA QN by PCR: NEGATIVE IU/mL

## 2021-10-10 LAB — LUPUS ANTICOAGULANT PANEL
DRVVT: 40 s (ref 0.0–47.0)
PTT Lupus Anticoagulant: 33.3 s (ref 0.0–43.5)

## 2021-10-12 ENCOUNTER — Inpatient Hospital Stay: Payer: 59 | Admitting: Physician Assistant

## 2021-10-21 ENCOUNTER — Inpatient Hospital Stay: Payer: 59 | Attending: Physician Assistant | Admitting: Physician Assistant

## 2021-10-21 VITALS — BP 126/85 | HR 85 | Temp 98.5°F | Resp 18 | Ht 73.0 in | Wt 268.1 lb

## 2021-10-21 DIAGNOSIS — D61818 Other pancytopenia: Secondary | ICD-10-CM | POA: Diagnosis not present

## 2021-10-21 DIAGNOSIS — D696 Thrombocytopenia, unspecified: Secondary | ICD-10-CM | POA: Diagnosis not present

## 2021-10-21 DIAGNOSIS — D72819 Decreased white blood cell count, unspecified: Secondary | ICD-10-CM | POA: Diagnosis not present

## 2021-10-21 DIAGNOSIS — R161 Splenomegaly, not elsewhere classified: Secondary | ICD-10-CM | POA: Insufficient documentation

## 2021-10-21 IMAGING — CT NM PET TUM IMG INITIAL (PI) SKULL BASE T - THIGH
3 series · 25 of 25 positions shown · non-contrast
Comparison: CT abdomen/pelvis dated 03/07/2019

CLINICAL DATA: Initial treatment strategy for splenomegaly,
pancytopenia.

EXAM:
NUCLEAR MEDICINE PET SKULL BASE TO THIGH
TECHNIQUE: 15.9 mCi F-18 FDG was injected intravenously. Full-ring PET imaging
was performed from the skull base to thigh after the radiotracer. CT
data was obtained and used for attenuation correction and anatomic
localization.
Fasting blood glucose: 75 mg/dl

[Series 3: ct wb fusion · axial · 5.0mm · 1.37mm/px · z∈[+909,+1874]mm · 9 of 387 slices shown]
[im 1/387]
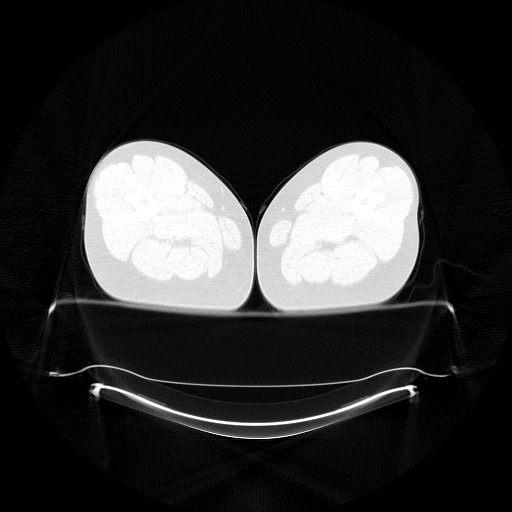
[im 49/387  brain]
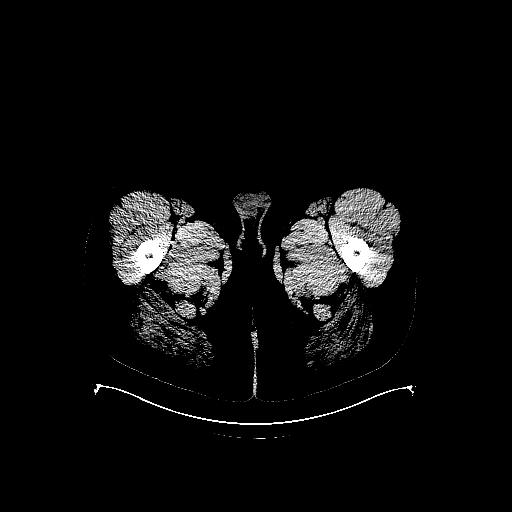
[im 97/387  brain]
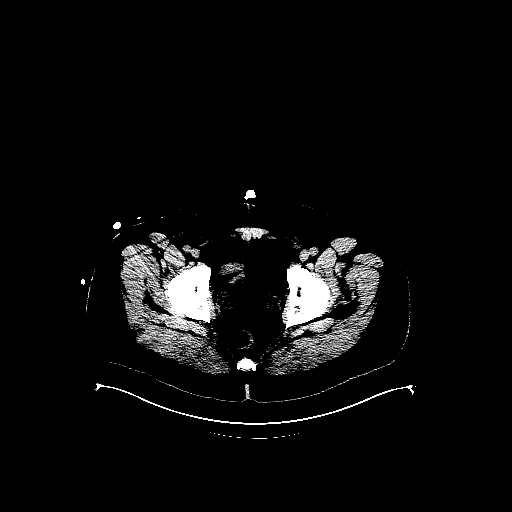
[im 145/387]
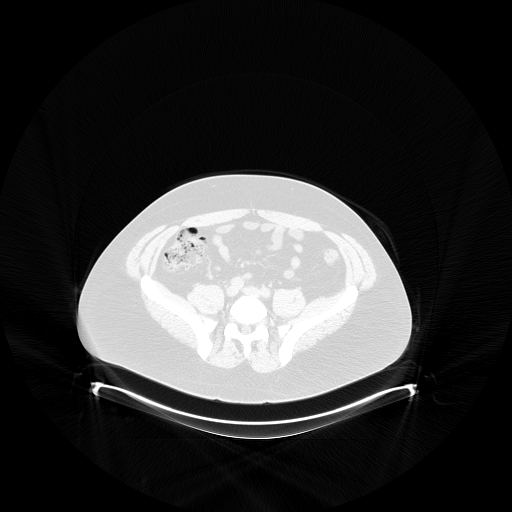
[im 194/387  brain]
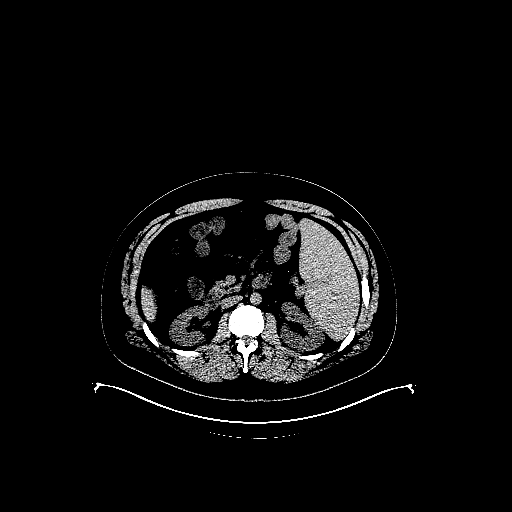
[im 242/387  brain]
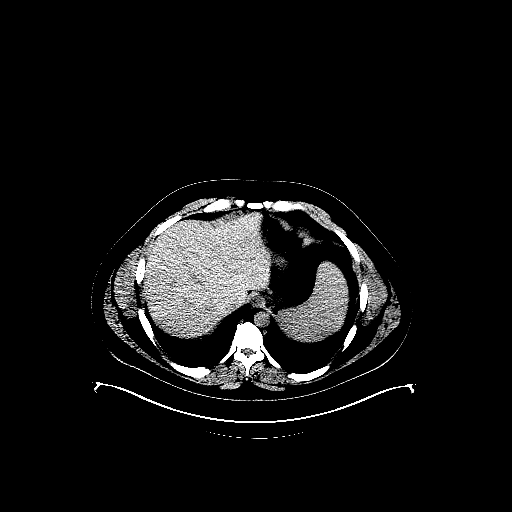
[im 290/387]
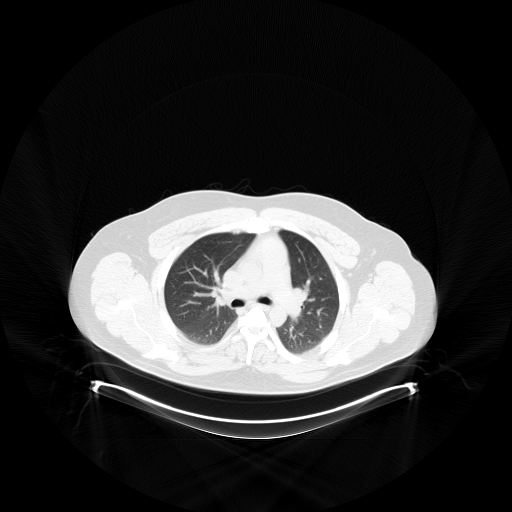
[im 338/387]
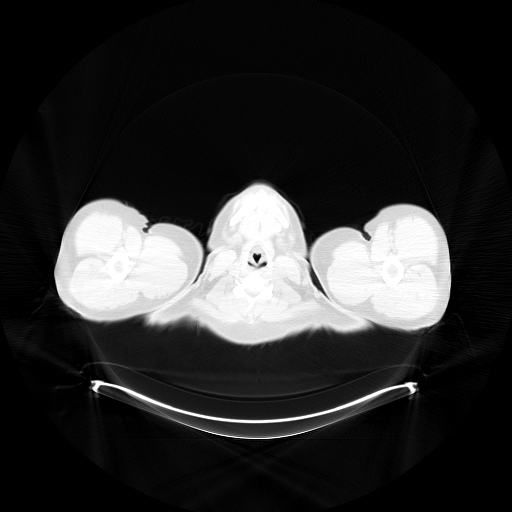
[im 387/387]
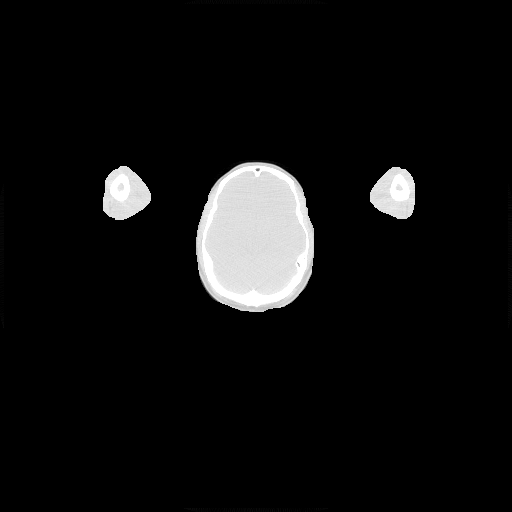

[Series 4: pet wb · axial · 5.0mm · 4.11mm/px · z∈[+909,+1874]mm · 8 of 387 slices shown]
[im 1/387]
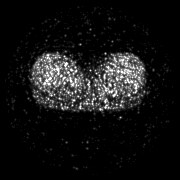
[im 56/387]
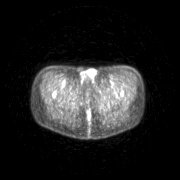
[im 111/387]
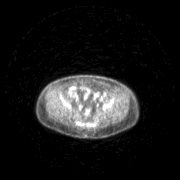
[im 166/387]
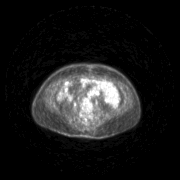
[im 221/387]
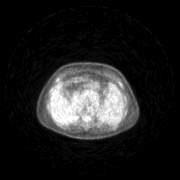
[im 276/387]
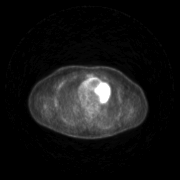
[im 331/387]
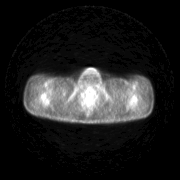
[im 387/387]
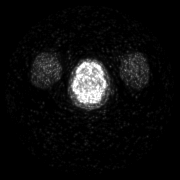

[Series 5: pet wb uncorrected · axial · 5.0mm · 4.11mm/px · z∈[+909,+1874]mm · 8 of 387 slices shown]
[im 1/387]
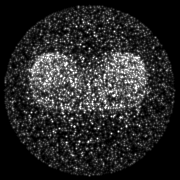
[im 56/387]
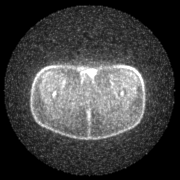
[im 111/387]
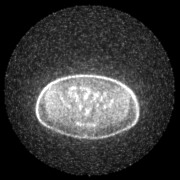
[im 166/387]
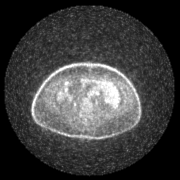
[im 221/387]
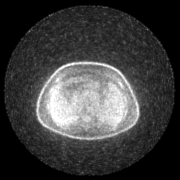
[im 276/387]
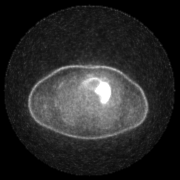
[im 331/387]
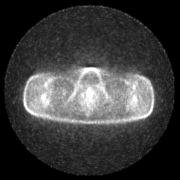
[im 387/387]
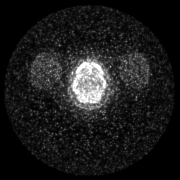

[25 of 25 positions shown; findings below may reference images not displayed]

FINDINGS: Mediastinal blood pool activity: SUV max

Liver activity: SUV max

NECK: No hypermetabolic cervical lymphadenopathy.

Incidental CT findings: none

CHEST: No hypermetabolic thoracic lymphadenopathy.

No suspicious pulmonary nodules.

Incidental CT findings: none

ABDOMEN/PELVIS: No hypermetabolic abdominopelvic lymphadenopathy.
Small jejunal mesentery lymph nodes measuring up to 9 mm short axis
(series 3/image 191), non FDG avid.

Marked splenomegaly, better evaluated on recent CT abdomen/pelvis.
No focal lesions or abnormal hypermetabolism. Reference max SUV 2.9.

No abnormal hypermetabolism in the liver, pancreas, or adrenal
glands.

Mild diffuse hypermetabolism in the proximal stomach, likely
physiologic.

Incidental CT findings: none

SKELETON: No focal hypermetabolic activity to suggest skeletal
metastasis.

Incidental CT findings: Mild degenerative changes of the lower
thoracic spine.
IMPRESSION: Splenomegaly.  No focal splenic lesions or abnormal hypermetabolism.

No suspicious lymphadenopathy. Small jejunal mesentery lymph nodes
are non FDG avid, likely reactive.

## 2021-10-21 NOTE — Patient Instructions (Signed)
[facility] at [facility] **VISIT SUMMARY & IMPORTANT INSTRUCTIONS **   You were seen today by Rojelio Brenner PA-C for your low white blood cells, low platelets, enlarged spleen, and other symptoms.    We will refer you to Dr. Dierdre Forth and/or Dr. Nickola Major [facility] Rheumatology) We will refer you to Dr. Alphonsa Gin [facility] hematology/oncology)  **It is EXTREMELY IMPORTANT that you get in touch with these providers to schedule your new patient appointments.  Please make sure that you are available to answer phone calls.  If you have not heard from their office in the next week, please CALL THEM to schedule your appointments.  FOLLOW-UP APPOINTMENT: Office visit with Dr. Ellin Saba in 4 months  ** Thank you for trusting me with your healthcare!  I strive to provide all of my patients with quality care at each visit.  If you receive a survey for this visit, I would be so grateful to you for taking the time to provide feedback.  Thank you in advance!  ~ Winfrey Chillemi                   Dr. Doreatha Massed   &   Rojelio Brenner, PA-C   - - - - - - - - - - - - - - - - - -    Thank you for choosing [facility] at [facility] to provide your oncology and hematology care.  To afford each patient quality time with our provider, please arrive at least 15 minutes before your scheduled appointment time.   If you have a lab appointment with the Cancer Center please come in thru the Main Entrance and check in at the main information desk.  You need to re-schedule your appointment should you arrive 10 or more minutes late.  We strive to give you quality time with our providers, and arriving late affects you and other patients whose appointments are after yours.  Also, if you no show three or more times for appointments you may be dismissed from the clinic at the providers discretion.     Again, thank you for choosing [facility].  Our hope  is that these requests will decrease the amount of time that you wait before being seen by our physicians.       _____________________________________________________________  Should you have questions after your visit to [facility], please contact our office at [phone] and follow the prompts.  Our office hours are 8:00 a.m. and 4:30 p.m. Monday - Friday.  Please note that voicemails left after 4:00 p.m. may not be returned until the following business day.  We are closed weekends and major holidays.  You do have access to a nurse 24-7, just call the main number to the clinic [phone] and do not press any options, hold on the line and a nurse will answer the phone.    For prescription refill requests, have your pharmacy contact our office and allow 72 hours.

## 2021-10-21 NOTE — Progress Notes (Signed)
[place], [place] [address]   CLINIC:  Medical Oncology/Hematology  PCP:  Celene Squibb, MD [place] [address] [phone]   REASON FOR VISIT:  Follow-up for leukopenia and thrombocytopenia in the setting of massive splenomegaly  CURRENT THERAPY: Surveillance  INTERVAL HISTORY:  [name] Kelley 36 y.o. male returns for routine follow-up of his leukopenia, thrombocytopenia, and splenomegaly.  He was last seen by Tarri Abernethy PA-C on 08/26/2021.  Patient continues to report significant fatigue.  He has intermittent episodes of fevers and shaking chills that will last 2 to 4 days, occurring about once per month and associated with diffuse myalgias.  He has daily joint pain and stiffness lasting for greater than 60 minutes after waking.  He denies any rashes or alopecia. He has intermittent lymphadenopathy of his right neck, which is tender.  He reports dry cough for the past several months.    He reports frequent infections (viral colds and upper respiratory infections), and has required antibiotics 3 times since January 2023.  (Reports that previously described fevers are unrelated to these infections.)    Over the past year, he has also developed hot flashes and drenching night sweats occurring about twice a week.  He admits to easy bruising but denies any petechial rash or abnormal bleeding.   He continues to experience decreased appetite, early satiety, and left upper quadrant abdominal soreness.   He reports 40% energy and 65% appetite.  He reports that he has been maintaining a stable weight and denies any unintentional weight loss.   REVIEW OF SYSTEMS:   Review of Systems  Constitutional:  Positive for appetite change, diaphoresis, fatigue and fever. Negative for chills and unexpected weight change.  HENT:   Positive for lump/mass (Intermittent right cervical lymphadenopathy) and trouble swallowing (Occasionally).  Negative for nosebleeds.   Eyes:  Negative for eye problems.  Respiratory:  Positive for cough (Dry) and shortness of breath. Negative for hemoptysis.   Cardiovascular:  Negative for chest pain, leg swelling and palpitations.  Gastrointestinal:  Positive for nausea. Negative for abdominal pain, blood in stool, constipation, diarrhea and vomiting.  Genitourinary:  Negative for hematuria.   Skin: Negative.   Neurological:  Positive for dizziness, headaches and numbness. Negative for light-headedness.  Hematological:  Does not bruise/bleed easily.  Psychiatric/Behavioral:  Positive for depression. The patient is nervous/anxious.       PAST MEDICAL/SURGICAL HISTORY:  Past Medical History:  Diagnosis Date   Medical history non-contributory    Past Surgical History:  Procedure Laterality Date   Arm Manipulation Right 1995?   INCISION AND DRAINAGE ABSCESS N/A 01/05/2013   Procedure: INCISION AND DRAINAGE PERIANAL  ABSCESS;  Surgeon: Jamesetta So, MD;  Location: [facility];  Service: General;  Laterality: N/A;   ROTATOR CUFF REPAIR     SHOULDER ARTHROSCOPY WITH OPEN ROTATOR CUFF REPAIR AND DISTAL CLAVICLE ACROMINECTOMY Left 11/02/2016   Procedure: LEFT SHOULDER ARTHROSCOPY WITH MINI OPEN ROTATOR CUFF REPAIR, OPEN BICEPS TENODESIS, AND DEBRIDEMENT;  Surgeon: Meredith Pel, MD;  Location: [facility];  Service: Orthopedics;  Laterality: Left;   SHOULDER ARTHROSCOPY WITH SUBACROMIAL DECOMPRESSION, ROTATOR CUFF REPAIR AND BICEP TENDON REPAIR Right 12/09/2015   Procedure: SHOULDER DIAGNOSTIC OPERATIVE ARTHROSCOPY, SUBACROMIAL DECOMPRESSION, DISTAL CLAVICLE EXCISION, MINI-OPEN ROTATOR CUFF REPAIR AND BICEP TENODESIS.;  Surgeon: Meredith Pel, MD;  Location: [facility];  Service: Orthopedics;  Laterality: Right;   widsom teeth  2017     SOCIAL HISTORY:  Social History   Socioeconomic History   Marital status: Married    Spouse name: Not on file   Number of children: Not on file   Years of  education: Not on file   Highest education level: Not on file  Occupational History   Not on file  Tobacco Use   Smoking status: Never   Smokeless tobacco: Never  Vaping Use   Vaping Use: Never used  Substance and Sexual Activity   Alcohol use: No   Drug use: No   Sexual activity: Not on file  Other Topics Concern   Not on file  Social History Narrative   Not on file   Social Determinants of Health   Financial Resource Strain: Not on file  Food Insecurity: Not on file  Transportation Needs: Not on file  Physical Activity: Not on file  Stress: Not on file  Social Connections: Not on file  Intimate Partner Violence: Not on file    FAMILY HISTORY:  Family History  Problem Relation Age of Onset   Prostate cancer Father     CURRENT MEDICATIONS:  Outpatient Encounter Medications as of 10/21/2021  Medication Sig   acetaminophen (TYLENOL) 500 MG tablet Take 2 tablets (1,000 mg total) by mouth every 6 (six) hours.   buPROPion (WELLBUTRIN XL) 150 MG 24 hr tablet Take 150 mg by mouth daily.   buPROPion (WELLBUTRIN XL) 150 MG 24 hr tablet TAKE 1 TABLET BY MOUTH DAILY   celecoxib (CELEBREX) 200 MG capsule Take 200 mg by mouth daily.   celecoxib (CELEBREX) 200 MG capsule Take 1 capsule by mouth daily   desvenlafaxine (PRISTIQ) 50 MG 24 hr tablet Take 1 tablet (50 mg total) by mouth daily.   Desvenlafaxine Succinate ER 25 MG TB24 Take 1 tablet by mouth daily.   methocarbamol (ROBAXIN) 500 MG tablet Take 1 tablet (500 mg total) by mouth every 6 (six) hours as needed for muscle spasms.   methocarbamol (ROBAXIN) 500 MG tablet Take 1 tablet (500 mg total) by mouth every 8 (eight) hours as needed for muscle spasms.   montelukast (SINGULAIR) 10 MG tablet Take 1 tablet (10 mg total) by mouth at bedtime.   Olopatadine-Mometasone (RYALTRIS) G7528004 MCG/ACT SUSP Use 2 sprays in each nostril twice a day   oxyCODONE (OXY IR/ROXICODONE) 5 MG immediate release tablet Take 5 mg by mouth every 6 (six)  hours as needed.   Promethazine HCl 6.25 MG/5ML SOLN Take 5 mLs by mouth at bedtime.   zolpidem (AMBIEN) 10 MG tablet Take 10 mg by mouth daily as needed.   No facility-administered encounter medications on file as of 10/21/2021.    ALLERGIES:  Allergies  Allergen Reactions   Hydrocodone Swelling    Tongue and lips swelling     PHYSICAL EXAM:  ECOG PERFORMANCE STATUS: 2 - Symptomatic, <50% confined to bed  There were no vitals filed for this visit. There were no vitals filed for this visit. Physical Exam Constitutional:      Appearance: Normal appearance.  HENT:     Head: Normocephalic and atraumatic.     Mouth/Throat:     Mouth: Mucous membranes are moist.  Eyes:     Extraocular Movements: Extraocular movements intact.     Pupils: Pupils are equal, round, and reactive to light.  Cardiovascular:     Rate and Rhythm: Normal rate and regular rhythm.     Pulses: Normal pulses.     Heart sounds: Normal heart sounds.  Pulmonary:  Effort: Pulmonary effort is normal.     Breath sounds: Normal breath sounds.  Abdominal:     General: Bowel sounds are normal.     Palpations: Abdomen is soft. There is no splenomegaly.     Tenderness: There is no abdominal tenderness.  Musculoskeletal:        General: No swelling.     Right lower leg: No edema.     Left lower leg: No edema.  Lymphadenopathy:     Cervical: No cervical adenopathy.  Skin:    General: Skin is warm and dry.  Neurological:     General: No focal deficit present.     Mental Status: He is alert and oriented to person, place, and time.  Psychiatric:        Mood and Affect: Mood normal.        Behavior: Behavior normal.      LABORATORY DATA:  I have reviewed the labs as listed.  CBC    Component Value Date/Time   WBC 2.1 (L) 08/27/2021 1542   RBC 4.91 08/27/2021 1542   HGB 14.2 08/27/2021 1542   HCT 41.8 08/27/2021 1542   PLT 79 (L) 08/27/2021 1542   MCV 85.1 08/27/2021 1542   MCH 28.9 08/27/2021 1542    MCHC 34.0 08/27/2021 1542   RDW 13.8 08/27/2021 1542   LYMPHSABS 0.7 08/27/2021 1542   MONOABS 0.2 08/27/2021 1542   EOSABS 0.1 08/27/2021 1542   BASOSABS 0.0 08/27/2021 1542      Latest Ref Rng & Units 08/27/2021    4:05 PM 11/27/2019    1:37 AM 11/26/2019    4:09 PM  CMP  Glucose 70 - 99 mg/dL 92  96  105   BUN 6 - 20 mg/dL $Remove'17  14  17   'rYASVbq$ Creatinine 0.61 - 1.24 mg/dL 1.22  1.17  1.12   Sodium 135 - 145 mmol/L 136  137  134   Potassium 3.5 - 5.1 mmol/L 4.0  3.8  4.2   Chloride 98 - 111 mmol/L 105  105  103   CO2 22 - 32 mmol/L $RemoveB'26  25  24   'qGRDZFdZ$ Calcium 8.9 - 10.3 mg/dL 8.5  8.9  8.8   Total Protein 6.5 - 8.1 g/dL 7.3   7.0   Total Bilirubin 0.3 - 1.2 mg/dL 1.0   1.0   Alkaline Phos 38 - 126 U/L 71   60   AST 15 - 41 U/L 26   22   ALT 0 - 44 U/L 40   29     DIAGNOSTIC IMAGING:  I have independently reviewed the relevant imaging and discussed with the patient.   ASSESSMENT & PLAN: 1.  Leukopenia & thrombocytopenia, secondary to splenomegaly - Has been followed by Hematology/Oncology clinic since June 2020 - Reportedly told that he had a "large spleen" around age 68 or 34, evaluated at [facility] in the mid 1990s with ultrasound, possible blood tests.  Denies any family history of metabolic storage disorder. - He did not have any interval imaging or work-up from the mid 1990s to 2020. - He has had thrombocytopenia since 2017. - Leukopenia since 2020. - Extensive previous work-up: Nutrition and connective tissue disorder work-up was negative.  (ANA and RF negative in June 2020) Bone marrow biopsy (07/19/2018) showed normocellular marrow for age with trilineage hematopoiesis and progressive maturation.  Chromosome analysis was normal. Peripheral blood flow cytometry was normal. Negative HIV testing in October 2021.  Negative for hepatitis A, hepatitis  B, and hepatitis C in June 2020. Negative for Lyme disease and [facility] spotted fever in June  2020 Leukemia/lymphoma panel obtained in June 2020 was unremarkable EBV panel from June 2020 showed increased VCA IgM, VCA IgG, and EBNA IgG, which was thought to be indicative of possible recent infection. CT abdomen/pelvis (03/07/2019) showed marked splenomegaly measuring 24 cm in craniocaudal direction.  Mild mesenteric lymphadenopathy with the largest lymph node measuring 1.6 cm and no other lymphadenopathy. CT abdomen/pelvis (11/26/2019): Stable splenomegaly with multiple enlarged mesenteric lymph nodes measuring up to 1.4 cm in size with several.  Similar to slightly decreased from comparison CT in January 2021 but without abnormal FDG avidity on PET/CT.  Finding remains nonspecific. PET scan (04/02/2019) showed splenomegaly with no focal spleen lesions or abnormal hypermetabolism.  No suspicious lymphadenopathy.  Small jejunal mesentery lymph nodes are non-FDG avid.  - Symptomatic splenomegaly with decreased appetite, left upper quadrant abdominal soreness, and early satiety   - Over the past year, patient has had onset of various symptoms including unexplained fevers, hot flashes, night sweats, severe fatigue.  Intermittent lymphadenopathy of right neck.  Dry cough.  Frequent infections.  Diffuse body aches. - He reports daily joint pain and stiffness that lasts >60 minutes - Most recent hematology work-up (July/August 2023): CMV and tuberculosis checked due to B symptoms with splenomegaly and cough.  Both were negative. Testosterone levels checked due to fatigue, night sweats, hot flashes.  Levels essentially normal. Quantitative immunoglobulins checked due to frequent infections.  Normal. Negative LDH 116.  CMP unremarkable. Chronic EBV ruled out.  Repeat EBV panel showed negative EBV VCA IgM with positive EBV VCA IgG (>600) and EBV NA IgG (>600) indicative of past infection.  EBV DNA by PCR was negative. Lupus panel indeterminate- positive dsDNA Ab at 13, but negative ANA, negative  anti-smooth muscle antibody, negative lupus anticoagulant/cardiolipin/beta 2 glycoprotein antibodies.  Normal complement.  Normal ESR and CRP.  DAT negative. - Most recent CBC (08/27/2021) shows WBC 2.1, ANC 1.1, platelets 79.  Blood counts essentially stable over the past 2 to 3 years. - CXR (obtained 08/26/2021 due to cough) was normal. - Sister diagnosed with lupus last year. - Unable to obtain records from [facility] due to time since occurrence. - DISCUSSION & PLAN: Extensive work-up has ruled out common causes of splenomegaly and cytopenias.  However, patient is not thought to have ICUS, since this would not fit with his splenomegaly. - Etiology of cytopenias is suspected to be massive splenomegaly, but the cause of his massive splenomegaly remains unclear. - B symptoms may be related to frequent infections in the setting of leukopenia, or may be related to primary cause of his splenomegaly, which remains unclear. - PET scan or repeat CT abdomen/pelvis was recommended, but patient declined at this time. - Discussed extensively with Dr. Delton Coombes, who recommends referral to tertiary center ([facility]. Cassell Clement at [facility]) for additional work-up.   - We will also refer to rheumatology (Dr. Amil Amen of [facility]) for work-up of questionable lupus type syndrome in the setting of positive dsDNA-Ab, cytopenias, joint pain/stiffness, and B symptoms  - RTC for appointment with Dr. Delton Coombes following second opinion from tertiary center, in approximately 4 months.     PLAN SUMMARY & DISPOSITION: Referral to tertiary center (Dr. Cassell Clement at [facility]) Referral to [facility] (Dr. Amil Amen) Office visit with Dr. Delton Coombes in 4 months  All questions were answered. The patient knows to call the clinic with any problems, questions or  concerns.  Medical decision making: Moderate  Time spent on visit: I spent 25 minutes counseling the patient face to face. The  total time spent in the appointment was 40 minutes and more than 50% was on counseling.   Harriett Rush, PA-C  10/21/2021 9:45 AM

## 2021-10-23 ENCOUNTER — Other Ambulatory Visit (HOSPITAL_COMMUNITY): Payer: Self-pay

## 2021-10-23 DIAGNOSIS — D709 Neutropenia, unspecified: Secondary | ICD-10-CM | POA: Diagnosis not present

## 2021-10-23 DIAGNOSIS — M545 Low back pain, unspecified: Secondary | ICD-10-CM | POA: Diagnosis not present

## 2021-10-23 DIAGNOSIS — F5104 Psychophysiologic insomnia: Secondary | ICD-10-CM | POA: Diagnosis not present

## 2021-10-23 DIAGNOSIS — F32A Depression, unspecified: Secondary | ICD-10-CM | POA: Diagnosis not present

## 2021-10-23 DIAGNOSIS — D6949 Other primary thrombocytopenia: Secondary | ICD-10-CM | POA: Diagnosis not present

## 2021-10-23 MED ORDER — CELECOXIB 200 MG PO CAPS
200.0000 mg | ORAL_CAPSULE | Freq: Every day | ORAL | 2 refills | Status: AC
  Filled 2021-10-23: qty 90, 90d supply, fill #0

## 2021-10-23 MED ORDER — ZOLPIDEM TARTRATE 10 MG PO TABS
10.0000 mg | ORAL_TABLET | Freq: Every day | ORAL | 2 refills | Status: DC | PRN
  Filled 2021-10-23: qty 90, 90d supply, fill #0

## 2022-02-22 ENCOUNTER — Ambulatory Visit: Payer: 59 | Admitting: Hematology

## 2022-03-17 DIAGNOSIS — H2513 Age-related nuclear cataract, bilateral: Secondary | ICD-10-CM | POA: Diagnosis not present

## 2022-03-17 DIAGNOSIS — H2511 Age-related nuclear cataract, right eye: Secondary | ICD-10-CM | POA: Diagnosis not present

## 2022-04-15 DIAGNOSIS — E875 Hyperkalemia: Secondary | ICD-10-CM | POA: Diagnosis not present

## 2022-04-15 DIAGNOSIS — E782 Mixed hyperlipidemia: Secondary | ICD-10-CM | POA: Diagnosis not present

## 2022-04-15 DIAGNOSIS — Z139 Encounter for screening, unspecified: Secondary | ICD-10-CM | POA: Diagnosis not present

## 2022-04-18 NOTE — Progress Notes (Shared)
[facility] [address], [place] [address]    Clinic Day:  04/18/2022  Referring physician: Celene Squibb, MD  Patient Care Team: Celene Squibb, MD as PCP - General (Internal Medicine)   ASSESSMENT & PLAN:   Assessment: ***  Plan: ***  No orders of the defined types were placed in this encounter.     I,Alexis Herring,acting as a Education administrator for [employer], MD.,have documented all relevant documentation on the behalf of [name] Jack, MD,as directed by  [name] Jack, MD while in the presence of [name] Jack, MD.   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 male presenting to clinic today for follow up of leukopenia and thrombocytopenia in the setting of massive splenomegaly. He was last seen by PA Tarri Abernethy on 10/21/21.  Today, he states that he is doing well overall. His appetite level is at ***%. His energy level is at ***%.   PAST MEDICAL HISTORY:   Past Medical History: Past Medical History:  Diagnosis Date   Medical history non-contributory     Surgical History: Past Surgical History:  Procedure Laterality Date   Arm Manipulation Right 1995?   INCISION AND DRAINAGE ABSCESS N/A 01/05/2013   Procedure: INCISION AND DRAINAGE PERIANAL  ABSCESS;  Surgeon: Jamesetta So, MD;  Location: [facility];  Service: General;  Laterality: N/A;   ROTATOR CUFF REPAIR     SHOULDER ARTHROSCOPY WITH OPEN ROTATOR CUFF REPAIR AND DISTAL CLAVICLE ACROMINECTOMY Left 11/02/2016   Procedure: LEFT SHOULDER ARTHROSCOPY WITH MINI OPEN ROTATOR CUFF REPAIR, OPEN BICEPS TENODESIS, AND DEBRIDEMENT;  Surgeon: Meredith Pel, MD;  Location: [facility];  Service: Orthopedics;  Laterality: Left;   SHOULDER ARTHROSCOPY WITH SUBACROMIAL DECOMPRESSION, ROTATOR CUFF REPAIR AND  BICEP TENDON REPAIR Right 12/09/2015   Procedure: SHOULDER DIAGNOSTIC OPERATIVE ARTHROSCOPY, SUBACROMIAL DECOMPRESSION, DISTAL CLAVICLE EXCISION, MINI-OPEN ROTATOR CUFF REPAIR AND BICEP TENODESIS.;  Surgeon: Meredith Pel, MD;  Location: [facility];  Service: Orthopedics;  Laterality: Right;   widsom teeth  2017    Social History: Social History   Socioeconomic History   Marital status: Married    Spouse name: Not on file   Number of children: Not on file   Years of education: Not on file   Highest education level: Not on file  Occupational History   Not on file  Tobacco Use   Smoking status: Never   Smokeless tobacco: Never  Vaping Use   Vaping Use: Never used  Substance and Sexual Activity   Alcohol use: No   Drug use: No   Sexual activity: Not on file  Other Topics Concern   Not on file  Social History Narrative   Not on file   Social Determinants of Health   Financial Resource Strain: Not on file  Food Insecurity: Not on file  Transportation Needs: Not on file  Physical Activity: Not on file  Stress: Not on file  Social Connections: Not on file  Intimate Partner Violence: Not on file    Family History: Family History  Problem Relation Age of Onset   Prostate cancer Father     Current Medications:  Current Outpatient Medications:    acetaminophen (TYLENOL) 500 MG tablet, Take 2 tablets (1,000 mg total) by mouth every 6 (six) hours., Disp: , Rfl:  buPROPion (WELLBUTRIN XL) 150 MG 24 hr tablet, Take 150 mg by mouth daily., Disp: , Rfl:    buPROPion (WELLBUTRIN XL) 150 MG 24 hr tablet, TAKE 1 TABLET BY MOUTH DAILY, Disp: 90 tablet, Rfl: 1   celecoxib (CELEBREX) 200 MG capsule, Take 200 mg by mouth daily., Disp: , Rfl:    celecoxib (CELEBREX) 200 MG capsule, Take 1 capsule by mouth daily, Disp: 90 capsule, Rfl: 1   celecoxib (CELEBREX) 200 MG capsule, Take 1 capsule (200 mg total) by mouth daily., Disp: 90 capsule, Rfl: 2   desvenlafaxine (PRISTIQ) 50 MG 24 hr  tablet, Take 1 tablet (50 mg total) by mouth daily., Disp: 90 tablet, Rfl: 1   Desvenlafaxine Succinate ER 25 MG TB24, Take 1 tablet by mouth daily., Disp: , Rfl:    methocarbamol (ROBAXIN) 500 MG tablet, Take 1 tablet (500 mg total) by mouth every 6 (six) hours as needed for muscle spasms., Disp: 30 tablet, Rfl: 0   methocarbamol (ROBAXIN) 500 MG tablet, Take 1 tablet (500 mg total) by mouth every 8 (eight) hours as needed for muscle spasms., Disp: 30 tablet, Rfl: 0   montelukast (SINGULAIR) 10 MG tablet, Take 1 tablet (10 mg total) by mouth at bedtime., Disp: 90 tablet, Rfl: 1   Olopatadine-Mometasone (RYALTRIS) 665-25 MCG/ACT SUSP, Use 2 sprays in each nostril twice a day, Disp: 29 g, Rfl: 5   oxyCODONE (OXY IR/ROXICODONE) 5 MG immediate release tablet, Take 5 mg by mouth every 6 (six) hours as needed., Disp: , Rfl:    Promethazine HCl 6.25 MG/5ML SOLN, Take 5 mLs by mouth at bedtime., Disp: , Rfl:    zolpidem (AMBIEN) 10 MG tablet, Take 10 mg by mouth daily as needed., Disp: , Rfl:    zolpidem (AMBIEN) 10 MG tablet, Take 1 tablet (10 mg total) by mouth daily as needed., Disp: 90 tablet, Rfl: 2   Allergies: Allergies  Allergen Reactions   Hydrocodone Swelling    Tongue and lips swelling    REVIEW OF SYSTEMS:   Review of Systems  Constitutional:  Negative for chills, fatigue and fever.  HENT:   Negative for lump/mass, mouth sores, nosebleeds, sore throat and trouble swallowing.   Eyes:  Negative for eye problems.  Respiratory:  Negative for cough and shortness of breath.   Cardiovascular:  Negative for chest pain, leg swelling and palpitations.  Gastrointestinal:  Negative for abdominal pain, constipation, diarrhea, nausea and vomiting.  Genitourinary:  Negative for bladder incontinence, difficulty urinating, dysuria, frequency, hematuria and nocturia.   Musculoskeletal:  Negative for arthralgias, back pain, flank pain, myalgias and neck pain.  Skin:  Negative for itching and rash.   Neurological:  Negative for dizziness, headaches and numbness.  Hematological:  Does not bruise/bleed easily.  Psychiatric/Behavioral:  Negative for depression, sleep disturbance and suicidal ideas. The patient is not nervous/anxious.   All other systems reviewed and are negative.    VITALS:   There were no vitals taken for this visit.  Wt Readings from Last 3 Encounters:  10/21/21 121.6 kg (268 lb 1.6 oz)  08/26/21 119.4 kg (263 lb 4.8 oz)  11/26/19 124.7 kg (275 lb)    There is no height or weight on file to calculate BMI.   PHYSICAL EXAM:   Physical Exam Vitals and nursing note reviewed. Exam conducted with a chaperone present.  Constitutional:      Appearance: Normal appearance.  Cardiovascular:     Rate and Rhythm: Normal rate and regular rhythm.  Pulses: Normal pulses.     Heart sounds: Normal heart sounds.  Pulmonary:     Effort: Pulmonary effort is normal.     Breath sounds: Normal breath sounds.  Abdominal:     Palpations: Abdomen is soft. There is no hepatomegaly, splenomegaly or mass.     Tenderness: There is no abdominal tenderness.  Musculoskeletal:     Right lower leg: No edema.     Left lower leg: No edema.  Lymphadenopathy:     Cervical: No cervical adenopathy.     Right cervical: No superficial, deep or posterior cervical adenopathy.    Left cervical: No superficial, deep or posterior cervical adenopathy.     Upper Body:     Right upper body: No supraclavicular or axillary adenopathy.     Left upper body: No supraclavicular or axillary adenopathy.  Neurological:     General: No focal deficit present.     Mental Status: He is alert and oriented to person, place, and time.  Psychiatric:        Mood and Affect: Mood normal.        Behavior: Behavior normal.     LABS:      Latest Ref Rng & Units 08/27/2021    3:42 PM 11/27/2019    1:37 AM 11/26/2019    4:09 PM  CBC  WBC 4.0 - 10.5 K/uL 2.1  1.7  2.1   Hemoglobin 13.0 - 17.0 g/dL 14.2  13.2   14.1   Hematocrit 39.0 - 52.0 % 41.8  39.4  41.1   Platelets 150 - 400 K/uL 79  71  83       Latest Ref Rng & Units 08/27/2021    4:05 PM 11/27/2019    1:37 AM 11/26/2019    4:09 PM  CMP  Glucose 70 - 99 mg/dL 92  96  105   BUN 6 - 20 mg/dL '17  14  17   '$ Creatinine 0.61 - 1.24 mg/dL 1.22  1.17  1.12   Sodium 135 - 145 mmol/L 136  137  134   Potassium 3.5 - 5.1 mmol/L 4.0  3.8  4.2   Chloride 98 - 111 mmol/L 105  105  103   CO2 22 - 32 mmol/L '26  25  24   '$ Calcium 8.9 - 10.3 mg/dL 8.5  8.9  8.8   Total Protein 6.5 - 8.1 g/dL 7.3   7.0   Total Bilirubin 0.3 - 1.2 mg/dL 1.0   1.0   Alkaline Phos 38 - 126 U/L 71   60   AST 15 - 41 U/L 26   22   ALT 0 - 44 U/L 40   29      No results found for: "CEA1", "CEA" / No results found for: "CEA1", "CEA" No results found for: "PSA1" No results found for: "[id number]" No results found for: "CAN125"  No results found for: "TOTALPROTELP", "ALBUMINELP", "A1GS", "A2GS", "BETS", "BETA2SER", "GAMS", "MSPIKE", "SPEI" Lab Results  Component Value Date   TIBC 308 08/27/2021   TIBC 298 08/02/2018   FERRITIN 108 08/27/2021   FERRITIN 104 08/02/2018   IRONPCTSAT 20 08/27/2021   IRONPCTSAT 28 08/02/2018   Lab Results  Component Value Date   LDH 116 08/27/2021   LDH 125 07/31/2019   LDH 120 08/30/2018     STUDIES:   No results found.

## 2022-04-19 ENCOUNTER — Inpatient Hospital Stay: Payer: Commercial Managed Care - PPO | Admitting: Hematology

## 2022-04-19 LAB — LAB REPORT - SCANNED
A1c: 4.8
Microalb Creat Ratio: 3

## 2022-10-16 DIAGNOSIS — E663 Overweight: Secondary | ICD-10-CM | POA: Diagnosis not present

## 2022-10-16 DIAGNOSIS — Z13228 Encounter for screening for other metabolic disorders: Secondary | ICD-10-CM | POA: Diagnosis not present

## 2022-10-16 DIAGNOSIS — I10 Essential (primary) hypertension: Secondary | ICD-10-CM | POA: Diagnosis not present

## 2022-10-16 DIAGNOSIS — E669 Obesity, unspecified: Secondary | ICD-10-CM | POA: Diagnosis not present

## 2022-10-16 DIAGNOSIS — Z6834 Body mass index (BMI) 34.0-34.9, adult: Secondary | ICD-10-CM | POA: Diagnosis not present

## 2022-10-16 DIAGNOSIS — Z131 Encounter for screening for diabetes mellitus: Secondary | ICD-10-CM | POA: Diagnosis not present

## 2022-10-16 DIAGNOSIS — E559 Vitamin D deficiency, unspecified: Secondary | ICD-10-CM | POA: Diagnosis not present

## 2022-10-16 DIAGNOSIS — Z0000 Encounter for general adult medical examination without abnormal findings: Secondary | ICD-10-CM | POA: Diagnosis not present

## 2022-11-05 DIAGNOSIS — M545 Low back pain, unspecified: Secondary | ICD-10-CM | POA: Diagnosis not present

## 2022-11-05 DIAGNOSIS — E669 Obesity, unspecified: Secondary | ICD-10-CM | POA: Diagnosis not present

## 2022-11-05 DIAGNOSIS — Z6835 Body mass index (BMI) 35.0-35.9, adult: Secondary | ICD-10-CM | POA: Diagnosis not present

## 2022-11-05 DIAGNOSIS — G8929 Other chronic pain: Secondary | ICD-10-CM | POA: Diagnosis not present

## 2022-11-05 DIAGNOSIS — M75121 Complete rotator cuff tear or rupture of right shoulder, not specified as traumatic: Secondary | ICD-10-CM | POA: Diagnosis not present

## 2022-11-15 DIAGNOSIS — E782 Mixed hyperlipidemia: Secondary | ICD-10-CM | POA: Diagnosis not present

## 2022-11-15 DIAGNOSIS — Z131 Encounter for screening for diabetes mellitus: Secondary | ICD-10-CM | POA: Diagnosis not present

## 2022-11-24 ENCOUNTER — Ambulatory Visit: Payer: Commercial Managed Care - PPO | Admitting: Orthopedic Surgery

## 2022-11-24 DIAGNOSIS — S46012D Strain of muscle(s) and tendon(s) of the rotator cuff of left shoulder, subsequent encounter: Secondary | ICD-10-CM

## 2022-11-24 DIAGNOSIS — M545 Low back pain, unspecified: Secondary | ICD-10-CM

## 2022-11-24 DIAGNOSIS — M542 Cervicalgia: Secondary | ICD-10-CM | POA: Diagnosis not present

## 2022-11-25 ENCOUNTER — Encounter: Payer: Self-pay | Admitting: Orthopedic Surgery
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
## 2022-12-01 ENCOUNTER — Telehealth: Payer: Self-pay | Admitting: Orthopedic Surgery

## 2022-12-01 NOTE — Telephone Encounter (Signed)
Called pt LVM to CB and schedule MRI review appt needs to be at least 2 weeks out being he has to get 3 different ones appt needs to be 30 mins

## 2022-12-03 ENCOUNTER — Ambulatory Visit
Admission: RE | Admit: 2022-12-03 | Discharge: 2022-12-03 | Disposition: A | Discharge status: Home / Self Care | Payer: Commercial Managed Care - PPO | Source: Ambulatory Visit | Attending: Orthopedic Surgery | Admitting: Orthopedic Surgery

## 2022-12-03 DIAGNOSIS — S46012A Strain of muscle(s) and tendon(s) of the rotator cuff of left shoulder, initial encounter: Secondary | ICD-10-CM | POA: Diagnosis not present

## 2022-12-03 DIAGNOSIS — M5412 Radiculopathy, cervical region: Secondary | ICD-10-CM | POA: Diagnosis not present

## 2022-12-03 DIAGNOSIS — M19012 Primary osteoarthritis, left shoulder: Secondary | ICD-10-CM | POA: Diagnosis not present

## 2022-12-03 DIAGNOSIS — M545 Low back pain, unspecified: Secondary | ICD-10-CM

## 2022-12-03 DIAGNOSIS — M25512 Pain in left shoulder: Secondary | ICD-10-CM | POA: Diagnosis not present

## 2022-12-03 DIAGNOSIS — S46012D Strain of muscle(s) and tendon(s) of the rotator cuff of left shoulder, subsequent encounter: Secondary | ICD-10-CM

## 2022-12-03 DIAGNOSIS — M7582 Other shoulder lesions, left shoulder: Secondary | ICD-10-CM | POA: Diagnosis not present

## 2022-12-03 DIAGNOSIS — M542 Cervicalgia: Secondary | ICD-10-CM

## 2022-12-16 ENCOUNTER — Ambulatory Visit: Payer: Commercial Managed Care - PPO | Admitting: Surgical

## 2022-12-16 ENCOUNTER — Telehealth: Payer: Self-pay | Admitting: Orthopedic Surgery

## 2022-12-16 NOTE — Telephone Encounter (Signed)
Pt called in stating he was having pain in the middle of the back in the thoracic part on the back and he got his MRI done on the neck and lower back but not the middle.

## 2022-12-17 NOTE — Telephone Encounter (Signed)
I'd recommend the celebrex and tylenol listed in his chart as a combo and try topical medications like biofreeze, voltaren, lidocaine patches

## 2022-12-17 NOTE — Telephone Encounter (Signed)
Anything I can advise him for pain until f/u with dr .August Saucer on 11/18?

## 2022-12-17 NOTE — Telephone Encounter (Signed)
Common that we see scapular pain as referred pain from c spine so that is probably why dr dean ordered MRI of the neck especially with complaint of numbness/tingling in hands

## 2022-12-17 NOTE — Telephone Encounter (Signed)
Lvm advising  

## 2023-01-03 ENCOUNTER — Ambulatory Visit: Payer: Commercial Managed Care - PPO | Admitting: Orthopedic Surgery

## 2023-01-03 DIAGNOSIS — M545 Low back pain, unspecified: Secondary | ICD-10-CM | POA: Diagnosis not present

## 2023-01-03 DIAGNOSIS — M542 Cervicalgia: Secondary | ICD-10-CM

## 2023-01-05 ENCOUNTER — Other Ambulatory Visit: Payer: Self-pay | Admitting: Physical Medicine and Rehabilitation

## 2023-01-05 MED ORDER — DIAZEPAM 5 MG PO TABS
ORAL_TABLET | ORAL | 0 refills | Status: AC
Start: 2023-01-05 — End: ?

## 2023-01-07 ENCOUNTER — Encounter: Payer: Self-pay | Admitting: Orthopedic Surgery
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
## 2023-01-11 ENCOUNTER — Encounter: Payer: Self-pay | Admitting: Orthopedic Surgery

## 2023-01-11 NOTE — Telephone Encounter (Signed)
Should pt come in again to have this filled out?

## 2023-01-12 NOTE — Telephone Encounter (Signed)
Can he bring this into [employer] to have them fill it out?

## 2023-01-17 ENCOUNTER — Telehealth: Payer: Self-pay

## 2023-01-17 NOTE — Telephone Encounter (Signed)
Patient dropped off a form to be completed for possible return to work. I do not see that you have taken patient out of work. Please advise.

## 2023-01-19 ENCOUNTER — Ambulatory Visit (HOSPITAL_COMMUNITY): Payer: Commercial Managed Care - PPO

## 2023-01-20 NOTE — Telephone Encounter (Signed)
You are right.  I never took him out of work.  Not sure what the angle is here.  Does he want to go back?

## 2023-02-03 NOTE — Telephone Encounter (Signed)
No overhead lifting with left arm more than 5 pounds , no floor to waist lifting or waist to shoulder lifting more than 10 pounds with the left arm.  Otherwise no restrictions from the shoulder perspective

## 2023-02-10 ENCOUNTER — Other Ambulatory Visit: Payer: Self-pay

## 2023-02-10 ENCOUNTER — Other Ambulatory Visit (HOSPITAL_COMMUNITY): Payer: Self-pay

## 2023-02-10 MED ORDER — CELECOXIB 200 MG PO CAPS
200.0000 mg | ORAL_CAPSULE | Freq: Every day | ORAL | 2 refills | Status: DC
  Filled 2023-05-03: qty 90, 90d supply, fill #0

## 2023-02-10 MED ORDER — GABAPENTIN 300 MG PO CAPS
300.0000 mg | ORAL_CAPSULE | Freq: Three times a day (TID) | ORAL | 2 refills | Status: AC
  Filled 2023-02-10 – 2023-02-15 (×2): qty 90, 30d supply, fill #0

## 2023-02-10 NOTE — Telephone Encounter (Signed)
Called and LVM

## 2023-02-11 ENCOUNTER — Other Ambulatory Visit: Payer: Self-pay

## 2023-02-15 ENCOUNTER — Other Ambulatory Visit: Payer: Self-pay

## 2023-02-15 ENCOUNTER — Other Ambulatory Visit (HOSPITAL_COMMUNITY): Payer: Self-pay

## 2023-02-22 ENCOUNTER — Other Ambulatory Visit: Payer: Self-pay

## 2023-02-22 ENCOUNTER — Other Ambulatory Visit (HOSPITAL_COMMUNITY): Payer: Self-pay

## 2023-02-22 ENCOUNTER — Other Ambulatory Visit (HOSPITAL_BASED_OUTPATIENT_CLINIC_OR_DEPARTMENT_OTHER): Payer: Self-pay

## 2023-02-22 MED ORDER — GABAPENTIN 600 MG PO TABS
600.0000 mg | ORAL_TABLET | Freq: Three times a day (TID) | ORAL | 2 refills | Status: AC
  Filled 2023-02-22: qty 90, 30d supply, fill #0

## 2023-02-24 ENCOUNTER — Ambulatory Visit: Payer: 59 | Admitting: Physical Medicine and Rehabilitation

## 2023-02-24 ENCOUNTER — Other Ambulatory Visit: Payer: Self-pay

## 2023-02-24 ENCOUNTER — Encounter: Payer: Self-pay | Admitting: Physical Medicine and Rehabilitation

## 2023-02-24 VITALS — BP 143/86 | HR 86

## 2023-02-24 DIAGNOSIS — M5416 Radiculopathy, lumbar region: Secondary | ICD-10-CM | POA: Diagnosis not present

## 2023-02-24 MED ORDER — METHYLPREDNISOLONE ACETATE 40 MG/ML IJ SUSP
40.0000 mg | Freq: Once | INTRAMUSCULAR | Status: AC
  Administered 2023-02-24: 40 mg

## 2023-02-24 NOTE — Progress Notes (Signed)
   Severe (9)  Cannot do any ADL's even with assistance can barely talk/unable to sleep and unable to use distraction. Severe range order  Average Pain 9  Patient states hurting everywhere.  Taking oxycodone ,gabapentin , methocarbamol .   +Driver, -BT, -Dye Allergies.

## 2023-02-24 NOTE — Procedures (Signed)
 Lumbar Epidural Steroid Injection - Interlaminar Approach with Fluoroscopic Guidance  Patient: [name] Kelley      Date of Birth: [birth year] MRN: [id number] PCP: Shona Norleen PEDLAR, MD      Visit Date: 02/24/2023   Universal Protocol:     Consent Given By: the patient  Position: PRONE  Additional Comments: Vital signs were monitored before and after the procedure. Patient was prepped and draped in the usual sterile fashion. The correct patient, procedure, and site was verified.   Injection Procedure Details:   Procedure diagnoses: Lumbar radiculopathy [M54.16]   Meds Administered:  Meds ordered this encounter  Medications   methylPREDNISolone  acetate (DEPO-MEDROL ) injection 40 mg     Laterality: Right  Location/Site:  L4-5  Needle: 3.5 in., 20 ga. Tuohy  Needle Placement: Paramedian epidural  Findings:   -Comments: Excellent flow of contrast into the epidural space.  Procedure Details: Using a paramedian approach from the side mentioned above, the region overlying the inferior lamina was localized under fluoroscopic visualization and the soft tissues overlying this structure were infiltrated with 4 ml. of 1% Lidocaine  without Epinephrine . The Tuohy needle was inserted into the epidural space using a paramedian approach.   The epidural space was localized using loss of resistance along with counter oblique bi-planar fluoroscopic views.  After negative aspirate for air, blood, and CSF, a 2 ml. volume of Isovue -250 was injected into the epidural space and the flow of contrast was observed. Radiographs were obtained for documentation purposes.    The injectate was administered into the level noted above.   Additional Comments:  No complications occurred Dressing: 2 x 2 sterile gauze and Band-Aid    Post-procedure details: Patient was observed during the procedure. Post-procedure instructions were reviewed.  Patient left the clinic in stable condition.

## 2023-03-06 NOTE — Progress Notes (Signed)
QUE LADUCA - 38 y.o. male MRN [id number]  Date of birth: 05-19-1985  Office Visit Note: Visit Date: 02/24/2023 PCP: Benita Stabile, MD Referred by: Benita Stabile, MD  Subjective: Chief Complaint  Patient presents with   Neck - Pain   Middle Back - Pain   HPI:  KNOXTON ANSLEY is a 38 y.o. male who comes in today at the request of Dr. Burnard Bunting for planned Right L4-5 Lumbar Interlaminar epidural steroid injection with fluoroscopic guidance.  The patient has failed conservative care including home exercise, medications, time and activity modification.  This injection will be diagnostic and hopefully therapeutic.  Please see requesting physician notes for further details and justification.  He and his spouse reports significant pain in the right upper lumbar lower thoracic region.  He reports severe pain with coughing etc.  Pain really out of proportion to findings on MRI that was done in October.  Lumbar spine MRI shows fairly mild anatomical pathology.  We will complete diagnostic and hopefully therapeutic right L4-5 interlaminar epidural steroid injection.  He will continue to follow-up with G. Dorene Grebe, MD and they may want to consider thoracic MRI.  He is also scheduled to see [place] for cervical spine epidural.  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   ROS Otherwise per HPI.  Assessment & Plan: Visit Diagnoses:    ICD-10-CM   1. Lumbar radiculopathy  M54.16 XR C-ARM NO REPORT    Epidural Steroid injection    methylPREDNISolone acetate (DEPO-MEDROL) injection 40 mg      Plan: No additional findings.   Meds & Orders:  Meds ordered this encounter  Medications   methylPREDNISolone acetate (DEPO-MEDROL) injection 40 mg    Orders Placed This Encounter  Procedures   XR C-ARM NO REPORT   Epidural Steroid injection    Follow-up: Return for Will see  him at next appointment for cervical epidural..   Procedures: No procedures performed  Lumbar Epidural Steroid Injection - Interlaminar Approach with Fluoroscopic Guidance  Patient: TAKASHI ATALLAH      Date of Birth: [birth year] MRN: [id number] PCP: Benita Stabile, MD      Visit Date: 02/24/2023   Universal Protocol:     Consent Given By: the patient  Position: PRONE  Additional Comments: Vital signs were monitored before and after the procedure. Patient was prepped and draped in the usual sterile fashion. The correct patient, procedure, and site was verified.   Injection Procedure Details:   Procedure diagnoses: Lumbar radiculopathy [M54.16]   Meds Administered:  Meds ordered this encounter  Medications   methylPREDNISolone acetate (DEPO-MEDROL) injection 40 mg     Laterality: Right  Location/Site:  L4-5  Needle: 3.5 in., 20 ga. Tuohy  Needle Placement: Paramedian epidural  Findings:   -Comments: Excellent flow of contrast into the epidural space.  Procedure Details: Using a paramedian approach from the side mentioned above, the region overlying the inferior lamina was localized under fluoroscopic visualization and the soft tissues overlying this structure were infiltrated with 4 ml. of 1% Lidocaine without Epinephrine. The Tuohy needle was inserted into the epidural space using a paramedian approach.   The epidural space was localized using loss of resistance along with counter oblique bi-planar fluoroscopic views.  After negative aspirate for air, blood, and CSF, a 2 ml. volume of Isovue-250 was injected into the epidural  space and the flow of contrast was observed. Radiographs were obtained for documentation purposes.    The injectate was administered into the level noted above.   Additional Comments:  No complications occurred Dressing: 2 x 2 sterile gauze and Band-Aid    Post-procedure details: Patient was observed during the procedure. Post-procedure  instructions were reviewed.  Patient left the clinic in stable condition.   Clinical History: MRI CERVICAL SPINE WITHOUT CONTRAST   TECHNIQUE: Multiplanar, multisequence MR imaging of the cervical spine was performed. No intravenous contrast was administered.   COMPARISON:  04/02/2020   FINDINGS: Alignment: No static listhesis. Straightening of the cervical lordosis which may be positional versus secondary to spasm.   Vertebrae: No acute fracture, evidence of discitis, or aggressive bone lesion.   Cord: Normal signal and morphology.   Posterior Fossa, vertebral arteries, paraspinal tissues: Posterior fossa demonstrates no focal abnormality. Vertebral artery flow voids are maintained. Paraspinal soft tissues are unremarkable.   Disc levels:   Discs: Mild degenerative disease with disc height loss at C4-5, C5-6 and C6-7 with mild endplate edema at C7.   C2-3: No significant disc bulge. No neural foraminal stenosis. No central canal stenosis.   C3-4: No disc herniation. Mild left foraminal narrowing. No right foraminal narrowing. No central canal narrowing.   C4-5: Mild disc osteophyte complex with a small broad shallow right paracentral disc protrusion. Mild bilateral foraminal stenosis. No central canal stenosis.   C5-6: Mild disc bulge with a small shallow broad central disc protrusion. Mild left foraminal stenosis. No right foraminal stenosis. No central canal stenosis.   C6-7: Broad-based disc osteophyte complex with a left paracentral prominent component. Mild-moderate left foraminal stenosis. No right foraminal stenosis. No central canal stenosis.   C7-T1: Mild disc bulge. No right foraminal stenosis. Mild-moderate left foraminal stenosis. No central canal stenosis.   IMPRESSION: 1. At C6-7 there is a broad-based disc osteophyte complex eccentric to the left. Previously seen central disc extrusion has regressed compared with the prior exam. Mild-moderate  left foraminal stenosis. 2. At C7-T1 there is a mild disc bulge. Mild-moderate left foraminal stenosis. 3. At C4-5 there is a mild disc osteophyte complex with a small broad shallow right paracentral disc protrusion. Mild bilateral foraminal stenosis. 4. At C5-6 there is a mild disc bulge with a small shallow broad central disc protrusion. Mild left foraminal stenosis. 5. No acute osseous injury of the cervical spine.     Electronically Signed   By: Elige Ko M.D.   On: 12/11/2022 07:57 ------ MRI LUMBAR SPINE WITHOUT CONTRAST   TECHNIQUE: Multiplanar, multisequence MR imaging of the lumbar spine was performed. No intravenous contrast was administered.   COMPARISON:  08/23/2017   FINDINGS: Segmentation:  Standard.   Alignment:  Physiologic.   Vertebrae: No acute fracture, evidence of discitis, or aggressive bone lesion.   Conus medullaris and cauda equina: Conus extends to the T12 level. Conus and cauda equina appear normal.   Paraspinal and other soft tissues: No acute paraspinal abnormality.   Disc levels:   Disc spaces: Moderate disc height loss at L4-5 with Modic 1 endplate changes. Disc desiccation at L3-4 and L5-S1.   T12-L1: No significant disc bulge. Mild bilateral facet arthropathy. No foraminal or central canal stenosis.   L1-L2: No significant disc bulge. Mild bilateral facet arthropathy. No foraminal or central canal stenosis.   L2-L3: Mild broad-based disc bulge. Mild bilateral facet arthropathy. No foraminal or central canal stenosis.   L3-L4: Mild broad-based disc bulge. Mild bilateral  facet arthropathy. No foraminal or central canal stenosis.   L4-L5: Mild broad-based disc bulge. Moderate bilateral facet arthropathy. No foraminal or central canal stenosis.   L5-S1: Mild disc bulge eccentric towards the left. Mild bilateral facet arthropathy. No foraminal or central canal stenosis.   IMPRESSION: 1. Mild lumbar spine spondylosis as  described above. 2. No acute osseous injury of the lumbar spine.     Electronically Signed   By: Elige Ko M.D.   On: 12/11/2022 08:00     Objective:  VS:  HT:    WT:   BMI:     BP:(!) 143/86  HR:86bpm  TEMP: ( )  RESP:  Physical Exam Vitals and nursing note reviewed.  Constitutional:      General: He is not in acute distress.    Appearance: Normal appearance. He is well-developed. He is not ill-appearing.  HENT:     Head: Normocephalic and atraumatic.     Right Ear: External ear normal.     Left Ear: External ear normal.     Nose: No congestion.  Eyes:     Extraocular Movements: Extraocular movements intact.     Conjunctiva/sclera: Conjunctivae normal.     Pupils: Pupils are equal, round, and reactive to light.  Cardiovascular:     Rate and Rhythm: Normal rate.     Pulses: Normal pulses.     Heart sounds: Normal heart sounds.  Pulmonary:     Effort: Pulmonary effort is normal. No respiratory distress.  Abdominal:     General: There is no distension.     Palpations: Abdomen is soft.  Musculoskeletal:        General: No tenderness or signs of injury.     Cervical back: Normal range of motion and neck supple. No rigidity.     Right lower leg: No edema.     Left lower leg: No edema.     Comments: Patient has good distal strength without clonus.  Skin:    General: Skin is warm and dry.     Findings: No erythema or rash.  Neurological:     General: No focal deficit present.     Mental Status: He is alert and oriented to person, place, and time.     Sensory: No sensory deficit.     Motor: No weakness or abnormal muscle tone.     Coordination: Coordination normal.     Gait: Gait normal.  Psychiatric:        Mood and Affect: Mood normal.        Behavior: Behavior normal.      Imaging: No results found.

## 2023-03-10 ENCOUNTER — Ambulatory Visit: Payer: 59 | Admitting: Physical Medicine and Rehabilitation

## 2023-03-10 ENCOUNTER — Other Ambulatory Visit: Payer: Self-pay

## 2023-03-10 DIAGNOSIS — M5412 Radiculopathy, cervical region: Secondary | ICD-10-CM

## 2023-03-10 MED ORDER — METHYLPREDNISOLONE ACETATE 40 MG/ML IJ SUSP
40.0000 mg | Freq: Once | INTRAMUSCULAR | Status: AC
  Administered 2023-03-10: 40 mg

## 2023-03-10 NOTE — Patient Instructions (Signed)

## 2023-03-10 NOTE — Progress Notes (Signed)
Functional Pain Scale - descriptive words and definitions  Uncomfortable (3)  Pain is present but can complete all ADL's/sleep is slightly affected and passive distraction only gives marginal relief. Mild range order  Average Pain 4  R > L  Numbness / tingling that travels down both arms.  Most of the time his L side is bothering him more than anything.  +Driver, -BT, -Dye Allergies.

## 2023-03-15 ENCOUNTER — Telehealth: Payer: Self-pay

## 2023-03-15 NOTE — Telephone Encounter (Signed)
-----   Message from [place] sent at 03/14/2023 11:19 AM EST ----- Regarding: Secure Paper work for Philis Kendall that was left for Dr. August Saucer, MD Leotis Shames  Received message from patients wife asking if the paperwork is completed that was left for Dr. August Saucer, MD for Maurine Minister.  I think he had a injection with Alvester Morin MD several weeks ago on 02/24/23 and then again on 03/10/23 last week. Thx Tisha

## 2023-03-16 ENCOUNTER — Other Ambulatory Visit: Payer: Self-pay

## 2023-03-17 NOTE — Procedures (Signed)
Cervical Epidural Steroid Injection - Interlaminar Approach with Fluoroscopic Guidance  Patient: [name] Kelley      Date of Birth: 07-Jun-1985 MRN: [id number] PCP: Benita Stabile, MD      Visit Date: 03/10/2023   Universal Protocol:    Date/Time: 03/16/2510:54 PM  Consent Given By: the patient  Position: PRONE  Additional Comments: Vital signs were monitored before and after the procedure. Patient was prepped and draped in the usual sterile fashion. The correct patient, procedure, and site was verified.   Injection Procedure Details:   Procedure diagnoses: Cervical radiculopathy [M54.12]    Meds Administered:  Meds ordered this encounter  Medications   methylPREDNISolone acetate (DEPO-MEDROL) injection 40 mg     Laterality: Left  Location/Site: C7-T1  Needle: 3.5 in., 20 ga. Tuohy  Needle Placement: Paramedian epidural space  Findings:  -Comments: Excellent flow of contrast into the epidural space.  Procedure Details: Using a paramedian approach from the side mentioned above, the region overlying the inferior lamina was localized under fluoroscopic visualization and the soft tissues overlying this structure were infiltrated with 4 ml. of 1% Lidocaine without Epinephrine. A # 20 gauge, Tuohy needle was inserted into the epidural space using a paramedian approach.  The epidural space was localized using loss of resistance along with contralateral oblique bi-planar fluoroscopic views.  After negative aspirate for air, blood, and CSF, a 2 ml. volume of Isovue-250 was injected into the epidural space and the flow of contrast was observed. Radiographs were obtained for documentation purposes.   The injectate was administered into the level noted above.  Additional Comments:  The patient tolerated the procedure well Dressing: 2 x 2 sterile gauze and Band-Aid    Post-procedure details: Patient was observed during the procedure. Post-procedure instructions were  reviewed.  Patient left the clinic in stable condition.

## 2023-03-17 NOTE — Telephone Encounter (Signed)
Yes I am not keeping him out of work from an orthopedic perspective that is correct

## 2023-03-17 NOTE — Telephone Encounter (Signed)
Please advise. I thought we had discussed with patient that we would not be able to keep him out of work?

## 2023-03-17 NOTE — Progress Notes (Signed)
[name] Kelley - 38 y.o. male MRN [id number]  Date of birth: 30-Mar-1985  Office Visit Note: Visit Date: 03/10/2023 PCP: Benita Stabile, MD Referred by: Benita Stabile, MD  Subjective: Chief Complaint  Patient presents with   Neck - Pain   HPI:  [name] Kelley is a 38 y.o. male who comes in today at the request of Dr. Burnard Bunting for planned Left C7-T1 Cervical Interlaminar epidural steroid injection with fluoroscopic guidance.  The patient has failed conservative care including home exercise, medications, time and activity modification.  This injection will be diagnostic and hopefully therapeutic.  Please see requesting physician notes for further details and justification. Still having right lower thoracic pain.   ROS Otherwise per HPI.  Assessment & Plan: Visit Diagnoses:    ICD-10-CM   1. Cervical radiculopathy  M54.12 XR C-ARM NO REPORT    Epidural Steroid injection    methylPREDNISolone acetate (DEPO-MEDROL) injection 40 mg      Plan: No additional findings.   Meds & Orders:  Meds ordered this encounter  Medications   methylPREDNISolone acetate (DEPO-MEDROL) injection 40 mg    Orders Placed This Encounter  Procedures   XR C-ARM NO REPORT   Epidural Steroid injection    Follow-up: Return in about 3 weeks (around 03/31/2023) for Dr. Dorene Grebe.   Procedures: No procedures performed  Cervical Epidural Steroid Injection - Interlaminar Approach with Fluoroscopic Guidance  Patient: [name] Kelley      Date of Birth: [birth year] MRN: [id number] PCP: Benita Stabile, MD      Visit Date: 03/10/2023   Universal Protocol:    Date/Time: 03/16/2510:54 PM  Consent Given By: the patient  Position: PRONE  Additional Comments: Vital signs were monitored before and after the procedure. Patient was prepped and draped in the usual sterile fashion. The correct patient, procedure, and site was verified.   Injection Procedure Details:   Procedure diagnoses: Cervical radiculopathy  [M54.12]    Meds Administered:  Meds ordered this encounter  Medications   methylPREDNISolone acetate (DEPO-MEDROL) injection 40 mg     Laterality: Left  Location/Site: C7-T1  Needle: 3.5 in., 20 ga. Tuohy  Needle Placement: Paramedian epidural space  Findings:  -Comments: Excellent flow of contrast into the epidural space.  Procedure Details: Using a paramedian approach from the side mentioned above, the region overlying the inferior lamina was localized under fluoroscopic visualization and the soft tissues overlying this structure were infiltrated with 4 ml. of 1% Lidocaine without Epinephrine. A # 20 gauge, Tuohy needle was inserted into the epidural space using a paramedian approach.  The epidural space was localized using loss of resistance along with contralateral oblique bi-planar fluoroscopic views.  After negative aspirate for air, blood, and CSF, a 2 ml. volume of Isovue-250 was injected into the epidural space and the flow of contrast was observed. Radiographs were obtained for documentation purposes.   The injectate was administered into the level noted above.  Additional Comments:  The patient tolerated the procedure well Dressing: 2 x 2 sterile gauze and Band-Aid    Post-procedure details: Patient was observed during the procedure. Post-procedure instructions were reviewed.  Patient left the clinic in stable condition.   Clinical History: MRI CERVICAL SPINE WITHOUT CONTRAST   TECHNIQUE: Multiplanar, multisequence MR imaging of the cervical spine was performed. No intravenous contrast was administered.   COMPARISON:  04/02/2020   FINDINGS: Alignment: No static listhesis. Straightening of the cervical lordosis which may be positional versus secondary  to spasm.   Vertebrae: No acute fracture, evidence of discitis, or aggressive bone lesion.   Cord: Normal signal and morphology.   Posterior Fossa, vertebral arteries, paraspinal tissues:  Posterior fossa demonstrates no focal abnormality. Vertebral artery flow voids are maintained. Paraspinal soft tissues are unremarkable.   Disc levels:   Discs: Mild degenerative disease with disc height loss at C4-5, C5-6 and C6-7 with mild endplate edema at C7.   C2-3: No significant disc bulge. No neural foraminal stenosis. No central canal stenosis.   C3-4: No disc herniation. Mild left foraminal narrowing. No right foraminal narrowing. No central canal narrowing.   C4-5: Mild disc osteophyte complex with a small broad shallow right paracentral disc protrusion. Mild bilateral foraminal stenosis. No central canal stenosis.   C5-6: Mild disc bulge with a small shallow broad central disc protrusion. Mild left foraminal stenosis. No right foraminal stenosis. No central canal stenosis.   C6-7: Broad-based disc osteophyte complex with a left paracentral prominent component. Mild-moderate left foraminal stenosis. No right foraminal stenosis. No central canal stenosis.   C7-T1: Mild disc bulge. No right foraminal stenosis. Mild-moderate left foraminal stenosis. No central canal stenosis.   IMPRESSION: 1. At C6-7 there is a broad-based disc osteophyte complex eccentric to the left. Previously seen central disc extrusion has regressed compared with the prior exam. Mild-moderate left foraminal stenosis. 2. At C7-T1 there is a mild disc bulge. Mild-moderate left foraminal stenosis. 3. At C4-5 there is a mild disc osteophyte complex with a small broad shallow right paracentral disc protrusion. Mild bilateral foraminal stenosis. 4. At C5-6 there is a mild disc bulge with a small shallow broad central disc protrusion. Mild left foraminal stenosis. 5. No acute osseous injury of the cervical spine.     Electronically Signed   By: Elige Ko M.D.   On: 12/11/2022 07:57 ------ MRI LUMBAR SPINE WITHOUT CONTRAST   TECHNIQUE: Multiplanar, multisequence MR imaging of the lumbar  spine was performed. No intravenous contrast was administered.   COMPARISON:  08/23/2017   FINDINGS: Segmentation:  Standard.   Alignment:  Physiologic.   Vertebrae: No acute fracture, evidence of discitis, or aggressive bone lesion.   Conus medullaris and cauda equina: Conus extends to the T12 level. Conus and cauda equina appear normal.   Paraspinal and other soft tissues: No acute paraspinal abnormality.   Disc levels:   Disc spaces: Moderate disc height loss at L4-5 with Modic 1 endplate changes. Disc desiccation at L3-4 and L5-S1.   T12-L1: No significant disc bulge. Mild bilateral facet arthropathy. No foraminal or central canal stenosis.   L1-L2: No significant disc bulge. Mild bilateral facet arthropathy. No foraminal or central canal stenosis.   L2-L3: Mild broad-based disc bulge. Mild bilateral facet arthropathy. No foraminal or central canal stenosis.   L3-L4: Mild broad-based disc bulge. Mild bilateral facet arthropathy. No foraminal or central canal stenosis.   L4-L5: Mild broad-based disc bulge. Moderate bilateral facet arthropathy. No foraminal or central canal stenosis.   L5-S1: Mild disc bulge eccentric towards the left. Mild bilateral facet arthropathy. No foraminal or central canal stenosis.   IMPRESSION: 1. Mild lumbar spine spondylosis as described above. 2. No acute osseous injury of the lumbar spine.     Electronically Signed   By: Elige Ko M.D.   On: 12/11/2022 08:00     Objective:  VS:  HT:    WT:   BMI:     BP:   HR: bpm  TEMP: ( )  RESP:  Physical Exam Vitals and nursing note reviewed.  Constitutional:      General: He is not in acute distress.    Appearance: Normal appearance. He is not ill-appearing.  HENT:     Head: Normocephalic and atraumatic.     Right Ear: External ear normal.     Left Ear: External ear normal.  Eyes:     Extraocular Movements: Extraocular movements intact.  Cardiovascular:     Rate and  Rhythm: Normal rate.     Pulses: Normal pulses.  Abdominal:     General: There is no distension.     Palpations: Abdomen is soft.  Musculoskeletal:        General: Tenderness present. No signs of injury.     Cervical back: Neck supple. Tenderness present. No rigidity.     Right lower leg: No edema.     Left lower leg: No edema.     Comments: Patient has good strength in the upper extremities with 5 out of 5 strength in wrist extension long finger flexion APB.  No intrinsic hand muscle atrophy.  Negative Hoffmann's test.  Lymphadenopathy:     Cervical: No cervical adenopathy.  Skin:    Findings: No erythema or rash.  Neurological:     General: No focal deficit present.     Mental Status: He is alert and oriented to person, place, and time.     Sensory: No sensory deficit.     Motor: No weakness or abnormal muscle tone.     Coordination: Coordination normal.  Psychiatric:        Mood and Affect: Mood normal.        Behavior: Behavior normal.      Imaging: No results found.

## 2023-03-18 NOTE — Telephone Encounter (Signed)
Talked with patient. He said that his PCP took him out of work. But his employer is asking about physical restrictions he would have from an orthopedic standpoint.   Please advise.

## 2023-03-20 NOTE — Telephone Encounter (Signed)
His physical restrictions from an orthopedic standpoint would be no overhead lifting with the left arm more than 5 pounds.  Floor to waist and waist to shoulder lifting with that left arm should also be limited to no more than 10 pounds due to his retracted and unrepairable supraspinatus tendon tear.  Thanks

## 2023-03-23 ENCOUNTER — Encounter: Payer: Self-pay | Admitting: Radiology

## 2023-03-23 NOTE — Telephone Encounter (Signed)
 Left voicemail that I sent note through MyChart in regards to these conditions.

## 2023-05-03 ENCOUNTER — Other Ambulatory Visit (HOSPITAL_COMMUNITY): Payer: Self-pay

## 2023-05-04 ENCOUNTER — Other Ambulatory Visit (HOSPITAL_COMMUNITY): Payer: Self-pay

## 2023-05-04 ENCOUNTER — Other Ambulatory Visit: Payer: Self-pay

## 2023-05-04 MED ORDER — ZOLPIDEM TARTRATE 10 MG PO TABS
10.0000 mg | ORAL_TABLET | Freq: Every day | ORAL | 1 refills | Status: AC | PRN
  Filled 2023-05-04: qty 90, 90d supply, fill #0

## 2023-05-05 ENCOUNTER — Other Ambulatory Visit: Payer: Self-pay

## 2023-05-10 ENCOUNTER — Other Ambulatory Visit: Payer: Self-pay

## 2023-05-17 DIAGNOSIS — E669 Obesity, unspecified: Secondary | ICD-10-CM | POA: Diagnosis not present

## 2023-05-21 ENCOUNTER — Other Ambulatory Visit (HOSPITAL_COMMUNITY): Payer: Self-pay

## 2023-05-23 ENCOUNTER — Other Ambulatory Visit (HOSPITAL_COMMUNITY): Payer: Self-pay

## 2023-05-23 MED ORDER — GABAPENTIN 600 MG PO TABS
600.0000 mg | ORAL_TABLET | Freq: Three times a day (TID) | ORAL | 2 refills | Status: AC
  Filled 2023-05-23: qty 90, 30d supply, fill #0

## 2023-05-23 MED ORDER — CELECOXIB 200 MG PO CAPS
200.0000 mg | ORAL_CAPSULE | Freq: Every day | ORAL | 1 refills | Status: AC
  Filled 2023-05-23: qty 30, 30d supply, fill #0

## 2023-05-24 ENCOUNTER — Encounter: Payer: Self-pay | Admitting: Pharmacist

## 2023-05-24 ENCOUNTER — Other Ambulatory Visit: Payer: Self-pay

## 2023-05-27 ENCOUNTER — Other Ambulatory Visit: Payer: Self-pay

## 2023-06-10 DIAGNOSIS — H6990 Unspecified Eustachian tube disorder, unspecified ear: Secondary | ICD-10-CM | POA: Diagnosis not present

## 2023-06-10 DIAGNOSIS — E669 Obesity, unspecified: Secondary | ICD-10-CM | POA: Diagnosis not present

## 2023-06-10 DIAGNOSIS — R030 Elevated blood-pressure reading, without diagnosis of hypertension: Secondary | ICD-10-CM | POA: Diagnosis not present

## 2023-06-10 DIAGNOSIS — Z6832 Body mass index (BMI) 32.0-32.9, adult: Secondary | ICD-10-CM | POA: Diagnosis not present

## 2023-07-27 ENCOUNTER — Other Ambulatory Visit (HOSPITAL_COMMUNITY): Payer: Self-pay

## 2023-12-19 ENCOUNTER — Encounter: Payer: Self-pay | Admitting: Radiology
